# Patient Record
Sex: Male | Born: 1980 | Race: White | Hispanic: No | Marital: Married | Smoking: Former smoker
Health system: Southern US, Community
[De-identification: ages and names within clinical notes are randomized; demographics above are authoritative.]

## PROBLEM LIST (undated history)

## (undated) DIAGNOSIS — F419 Anxiety disorder, unspecified: Secondary | ICD-10-CM

## (undated) DIAGNOSIS — I10 Essential (primary) hypertension: Secondary | ICD-10-CM

## (undated) DIAGNOSIS — R7303 Prediabetes: Secondary | ICD-10-CM

## (undated) DIAGNOSIS — F17200 Nicotine dependence, unspecified, uncomplicated: Secondary | ICD-10-CM

## (undated) HISTORY — DX: Essential (primary) hypertension: I10

## (undated) HISTORY — DX: Anxiety disorder, unspecified: F41.9

## (undated) HISTORY — DX: Nicotine dependence, unspecified, uncomplicated: F17.200

---
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
## 2006-04-06 ENCOUNTER — Ambulatory Visit: Payer: Self-pay | Admitting: Family Medicine

## 2006-09-03 ENCOUNTER — Ambulatory Visit: Payer: Self-pay | Admitting: Family Medicine

## 2008-09-07 ENCOUNTER — Ambulatory Visit: Payer: Self-pay | Admitting: Family Medicine

## 2008-09-13 ENCOUNTER — Emergency Department (HOSPITAL_COMMUNITY): Admission: EM | Admit: 2008-09-13 | Discharge: 2008-09-13 | Payer: Self-pay | Admitting: Emergency Medicine

## 2008-09-17 ENCOUNTER — Ambulatory Visit: Payer: Self-pay | Admitting: Family Medicine

## 2008-09-19 ENCOUNTER — Emergency Department (HOSPITAL_COMMUNITY): Admission: EM | Admit: 2008-09-19 | Discharge: 2008-09-19 | Payer: Self-pay | Admitting: Emergency Medicine

## 2008-09-29 ENCOUNTER — Emergency Department (HOSPITAL_COMMUNITY): Admission: EM | Admit: 2008-09-29 | Discharge: 2008-09-29 | Payer: Self-pay | Admitting: Emergency Medicine

## 2008-10-18 ENCOUNTER — Ambulatory Visit: Payer: Self-pay | Admitting: Family Medicine

## 2008-11-16 ENCOUNTER — Emergency Department (HOSPITAL_COMMUNITY): Admission: EM | Admit: 2008-11-16 | Discharge: 2008-11-16 | Payer: Self-pay | Admitting: Emergency Medicine

## 2009-03-22 ENCOUNTER — Ambulatory Visit: Payer: Self-pay | Admitting: Family Medicine

## 2009-04-04 ENCOUNTER — Emergency Department (HOSPITAL_COMMUNITY): Admission: EM | Admit: 2009-04-04 | Discharge: 2009-04-04 | Payer: Self-pay | Admitting: Emergency Medicine

## 2009-05-22 ENCOUNTER — Ambulatory Visit: Payer: Self-pay | Admitting: Family Medicine

## 2009-05-29 ENCOUNTER — Ambulatory Visit: Payer: Self-pay | Admitting: Family Medicine

## 2009-09-16 ENCOUNTER — Ambulatory Visit: Payer: Self-pay | Admitting: Family Medicine

## 2009-11-04 ENCOUNTER — Ambulatory Visit: Payer: Self-pay | Admitting: Family Medicine

## 2010-05-20 ENCOUNTER — Emergency Department (HOSPITAL_COMMUNITY): Admission: EM | Admit: 2010-05-20 | Discharge: 2010-05-20 | Payer: Self-pay | Admitting: Emergency Medicine

## 2010-05-27 ENCOUNTER — Ambulatory Visit: Payer: Self-pay | Admitting: Family Medicine

## 2010-07-02 ENCOUNTER — Ambulatory Visit: Payer: Self-pay | Admitting: Family Medicine

## 2011-01-05 LAB — BASIC METABOLIC PANEL
Calcium: 9.7 mg/dL (ref 8.4–10.5)
GFR calc Af Amer: >60 mL/min (ref >60)
GFR calc non Af Amer: >60 mL/min (ref >60)
Potassium: 3.6 mEq/L (ref 3.5–5.1)
Sodium: 140 mEq/L (ref 135–145)

## 2011-01-05 LAB — DIFFERENTIAL
Eosinophils Relative: 1 % (ref 0–5)
Lymphocytes Relative: 23 % (ref 12–46)
Lymphs Abs: 2.1 10*3/uL (ref 0.7–4.0)
Monocytes Absolute: 0.7 10*3/uL (ref 0.1–1.0)
Monocytes Relative: 8 % (ref 3–12)
Neutro Abs: 6.3 10*3/uL (ref 1.7–7.7)

## 2011-01-05 LAB — RAPID URINE DRUG SCREEN, HOSP PERFORMED
Amphetamines: NOT DETECTED
Barbiturates: NOT DETECTED
Benzodiazepines: POSITIVE — AB

## 2011-01-05 LAB — URINALYSIS, ROUTINE W REFLEX MICROSCOPIC
Glucose, UA: NEGATIVE mg/dL
Ketones, ur: NEGATIVE mg/dL
Protein, ur: NEGATIVE mg/dL
Urobilinogen, UA: 0.2 mg/dL (ref 0.0–1.0)

## 2011-01-05 LAB — CBC
HCT: 46.4 % (ref 39.0–52.0)
Hemoglobin: 15.9 g/dL (ref 13.0–17.0)
RBC: 4.98 MIL/uL (ref 4.22–5.81)

## 2011-01-23 ENCOUNTER — Encounter: Payer: Self-pay | Admitting: Family Medicine

## 2011-01-23 DIAGNOSIS — G43909 Migraine, unspecified, not intractable, without status migrainosus: Secondary | ICD-10-CM | POA: Insufficient documentation

## 2011-02-06 NOTE — Consult Note (Signed)
[place]  Patient:    Roberto Lewis                      MRN: [id number] Proc. Date: 07/28/99 Adm. Date:  [id number] Attending:  Miguel Aschoff CC:         Miguel Aschoff, M.D.             Marinda Elk, M.D.                          Consultation Report  BRIEF HISTORY:  This 30 year old white male overdosed on both Tylenol and aspirin. He took approximately 125 aspirin pills of 325 mg strength and approximately 10 500 mg Tylenol tablets.  The patient states he was found in a bathroom at the home f his girlfriend by her parents.  The patient is minimally talkative during the interview process although he is fully alert.  He gives vague answers to both questions.  He states that he is just "tired of listening to his friends" and states he has been taking on their burdens as his own.  He denied that he himself has had personal problems but insists that personal problems of his friends is hat he is taking on and this is what led him to his suicide attempt.  He will not elaborate despite numerous questions regarding what may be specifically stressing him out.  He tells me he never abused alcohol or drugs.  He also states that he has no formal psychiatric treatment but he does have a history of taking Zoloft 100 mg a day and he does not know for how long.  He denies any prior attempts but then states that "a while back" he did try to harm himself and then states that he "cant remember how", however, according to chart he did scratch is wrist with a can opener.  He told the admitting physician that he "just didnt want to be around anymore" before this overdose.  There is no evidence he wrote a suicide note.  HISTORY AND DRUG SCREEN:  Is negative for illicit compounds.  Blood alcohol level is negative as well.  MENTAL STATUS EXAMINATION:  The patient is alert and oriented to person, place,  time and situation.  His affect is  flat.  He is mentally talkative with the interview process.  His speech is soft and slow.  His affect does reflect depression.  He has some aches.  No involuntary movements are noted.  No auditory or visual hallucinations are reported.   No paranoia.  He denies that he currently has suicidal thoughts but he is minimally engaged in the interview process that I would not take this at face value at the present time.  FINAL DIAGNOSES:   AXIS I. Major depression status suicide attempt, no psychoses.  AXIS II. Deferred. AXIS III.  Status post overdose on Tylenol and aspirin.  AXIS IV. Reports stressful relationships with friends.   AXIS V. GAF 25, highest this year 80.  RECOMMENDATIONS:  I have not had a chance to phone the parents yet but I will do that also let our ACT team know that this patient is here and I think that when he is medically stable he should be transferred to the adolescent unit. EVen though he is saying he is not suicidal at the present time, he is giving me vague answers to questions and is minimally engaged in the  processes and really has not convinced me that he is completely safe if discharged to just an outpatient level of care.  Will have our ACT team follow up. DD:  07/28/99 TD:  07/28/99 Job: [id number]

## 2011-03-27 ENCOUNTER — Ambulatory Visit (INDEPENDENT_AMBULATORY_CARE_PROVIDER_SITE_OTHER): Payer: 59 | Admitting: Family Medicine

## 2011-03-27 ENCOUNTER — Encounter: Payer: Self-pay | Admitting: Family Medicine

## 2011-03-27 VITALS — BP 130/90 | HR 74 | Wt 207.0 lb

## 2011-03-27 DIAGNOSIS — F17200 Nicotine dependence, unspecified, uncomplicated: Secondary | ICD-10-CM

## 2011-03-27 NOTE — Patient Instructions (Signed)
Call 800 quit now. Use the patches but when they wear off put another one on even if it's not 24 hours. Return here in one month.

## 2011-03-27 NOTE — Progress Notes (Signed)
  Subjective:    Patient ID: Roberto Lewis, male    DOB: [birth year], 30 y.o.   MRN: [id number]  HPI He is here for consult concerning smoking cessation. He has tried patches in the past and has found them to be minimally useful. He has not called any of the 800 number to help with the psychological aspects of this. He hasn't been to do this since his 4-year-old son apparently is now asking him to quit. He also has noted a two-month history of right breast tenderness but no history of injury. He continues on medications listed in the chart.   Review of Systems     Objective:   Physical Exam Alert and in no distress. Exam of the right breast shows no palpable lesions. Abdominal exam shows no masses or tenderness.       Assessment & Plan:  Cigarette abuse. Right breast pain. We will do watchful waiting concern of breast pain and if continued difficulty one month, further evaluation will be warranted. I also discussed smoking cessation with him in regard to [place] versus addiction. Strongly encouraged him to call the 800 number. We'll also have him use the patches regularly and if they do not last a full 24 hours he can switch patches sooner. He'll return here in one month for recheck.

## 2011-04-09 ENCOUNTER — Encounter: Payer: Self-pay | Admitting: Family Medicine

## 2011-04-09 ENCOUNTER — Ambulatory Visit (INDEPENDENT_AMBULATORY_CARE_PROVIDER_SITE_OTHER): Payer: 59 | Admitting: Family Medicine

## 2011-04-09 VITALS — BP 132/80 | HR 80 | Ht 73.5 in | Wt 208.0 lb

## 2011-04-09 DIAGNOSIS — K6289 Other specified diseases of anus and rectum: Secondary | ICD-10-CM

## 2011-04-09 DIAGNOSIS — K625 Hemorrhage of anus and rectum: Secondary | ICD-10-CM

## 2011-04-09 MED ORDER — AMOXICILLIN-POT CLAVULANATE ER 1000-62.5 MG PO TB12: 2.0000 | ORAL_TABLET | Freq: Two times a day (BID) | ORAL | Status: AC

## 2011-04-09 NOTE — Progress Notes (Signed)
Patient presents for evaluation of blood in stool x 2-3 months, off and on.  Noticed a "knot" 7/9 near the rectum, which has gotten much more tender over the last few days.  Very painful now.  Had blood this morning after a bowel movement--on toilet paper; on other occasional has seen red in toilet bowl.  Denies staining in underwear.  Denies abdominal pain.  Denies constipation or diarrhea.  Denies straining for bowel movements. Denies pain with passage of stool.  Does lift heavy items at work  Past Medical History  Diagnosis Date  . Migraine   . Anxiety     (f/b by psych)    History reviewed. No pertinent past surgical history.  History   Social History  . Marital Status: Single    Spouse Name: N/A    Number of Children: 1  . Years of Education: N/A   Occupational History  . landscaper    Social History Main Topics  . Smoking status: Former Smoker -- 1.0 packs/day    Types: Cigarettes    Quit date: 03/29/2011  . Smokeless tobacco: Never Used  . Alcohol Use: No  . Drug Use: No  . Sexually Active: Not on file   Other Topics Concern  . Not on file   Social History Narrative  . No narrative on file    No family history on file.  Current outpatient prescriptions:ARIPiprazole (ABILIFY) 10 MG tablet, Take 10 mg by mouth daily.  , Disp: , Rfl: ;  divalproex (DEPAKOTE) 250 MG 24 hr tablet, Take 250 mg by mouth daily.  , Disp: , Rfl: ;  amoxicillin-clavulanate (AUGMENTIN XR) 1000-62.5 MG per tablet, Take 2 tablets by mouth 2 (two) times daily., Disp: 40 tablet, Rfl: 0  Allergies  Allergen Reactions  . Effexor (Venlafaxine Hydrochloride) Other (See Comments)    Throat swelling, blurred vision   ROS: denies fevers, chills, nausea, vomiting, abdominal pain, constipation diarrhea or other concerns  PHYSICAL EXAM: BP 132/80  Pulse 80  Ht 6' 1.5" (1.867 m)  Wt 208 lb (94.348 kg)  BMI 27.07 kg/m2 Well developed male, in mild-mod discomfort during exam Nicotine patch R upper  buttock External rectal area--a few tags, but no inflamed hemorrhoids.  There is a small area of soft tissue swelling L perirectal area that is very tender to touch.  No overlying erythema, no fluctuance, skin is smooth.  DRE: normal prostate,no masses, heme negative stool.  No significant tenderness on internal rectal exam.  ASSESSMENT/PLAN: 1. Rectal pain    suspect early perirectal abscess.  Sitz baths, antibiotics, re-check next week  2. Rectal bleeding    likely from hemorrhoids.  Due to perirectal discomfort, anoscopy not performed.  Consider anoscopy at f/u visit. No fissure or bleeding external hemorrhoids

## 2011-04-09 NOTE — Patient Instructions (Addendum)
Warm soaks 3 times/day.  Take antibiotics as prescribed, and f/u next week for re-check, sooner if getting worse. Try 800 mg of ibuprofen 3 times daily with food (4 Advils at a time)  [employer] A sitz bath is a warm water bath taken in the sitting position that covers only the hips and buttocks. It may be used for either healing or hygiene purposes. The water may contain medication. Sitz baths are often used to relieve pain, itching or muscle spasms. Moist heat will help you heal and relax. Follow these steps carefully: HOME CARE INSTRUCTIONS  Fill the bathtub half full with warm water. Not Too Hot!   Sit in the water and open the drain a little.   Turn on the warm water to keep the tub half full. Keep the water running constantly.   Soak in the water for 15 to 20 minutes.   After the sitz bath, blot dry the affected part first.   Take 3 to 4 sitz baths a day.  SEEK MEDICAL CARE IF: You get worse instead of better; stop the sitz baths. Document Released: 05/30/2004 Document Re-Released: 09/29/2009 [facility] Patient Information 2011 [place], [place].

## 2011-04-10 ENCOUNTER — Telehealth: Payer: Self-pay | Admitting: Medical

## 2011-04-10 ENCOUNTER — Encounter: Payer: Self-pay | Admitting: Medical

## 2011-04-10 ENCOUNTER — Encounter (INDEPENDENT_AMBULATORY_CARE_PROVIDER_SITE_OTHER): Payer: Self-pay | Admitting: Surgery

## 2011-04-10 ENCOUNTER — Encounter (INDEPENDENT_AMBULATORY_CARE_PROVIDER_SITE_OTHER): Payer: Self-pay | Admitting: General Surgery

## 2011-04-10 ENCOUNTER — Ambulatory Visit (INDEPENDENT_AMBULATORY_CARE_PROVIDER_SITE_OTHER): Payer: 59 | Admitting: Surgery

## 2011-04-10 ENCOUNTER — Ambulatory Visit (INDEPENDENT_AMBULATORY_CARE_PROVIDER_SITE_OTHER): Payer: 59 | Admitting: Medical

## 2011-04-10 VITALS — BP 144/82 | HR 68 | Temp 99.0°F | Ht 74.0 in | Wt 210.1 lb

## 2011-04-10 VITALS — BP 130/80 | HR 98 | Wt 208.0 lb

## 2011-04-10 DIAGNOSIS — K612 Anorectal abscess: Secondary | ICD-10-CM

## 2011-04-10 DIAGNOSIS — K611 Rectal abscess: Secondary | ICD-10-CM

## 2011-04-10 HISTORY — PX: INCISION AND DRAINAGE ABSCESS ANAL: SUR669

## 2011-04-10 MED ORDER — HYDROCODONE-ACETAMINOPHEN 5-500 MG PO TABS: 1.0000 | ORAL_TABLET | Freq: Every day | ORAL | Status: DC

## 2011-04-10 NOTE — Progress Notes (Signed)
Subjective:   HPI Roberto Lewis is a 30 y.o. male who presents for recheck.  Was seen here yesterday by Dr. Lynelle Doctor for rectal pain, recent blood in stool and possible early perirectal abscess.  Overnight and today the pain has worsened, and he feels a swelling and larger area near his rectum.  He can only sit a certain way due to the pain.  Using OTC pain meds without relief.  He has taken 3 doses of Augmentin at this point.  No other aggravating or relieving factors.  No other c/o.  The following portions of the patient's history were reviewed and updated as appropriate: allergies, current medications, past family history, past medical history, past social history, past surgical history and problem list.   Review of Systems Gen: subjective fever, no chills, sweats GI: no nausea, vomiting, diarrhea, abdominal pain     Objective:   Physical Exam  General appearance: alert, no distress, WD/WN, white male Rectal: left perirectal region with 2cm fluctuant  pink/red area with surrounding induration and very tender   Assessment :    Encounter Diagnosis  Name Primary?  . Perirectal abscess Yes     Plan:     I called [facility], and they will work him in to see Dr. Daphine Deutscher.  He received Toradol 60 mg IM now.  Last meal and last BM about 1.5 hours ago.

## 2011-04-10 NOTE — Progress Notes (Signed)
Patient referred from Dr. Joslyn Devon with a left anterior perirectal abscess.  He is in acute pain. No history of chronic diarrhea.  Local infiltration with neut and 6 cc lido with epi.  I & D performed without difficulty.    Followup in 2 weeks

## 2011-04-10 NOTE — Telephone Encounter (Signed)
Per shane pt was called and told to come in this pm

## 2011-04-10 NOTE — Patient Instructions (Signed)
Sit in warm tub bath or shower twice a day Take antibiotic.Marland KitchenMarland KitchenMarland KitchenMarland Kitchenpain med as needed   Peri-Rectal Abscess (Abscess Around the Rectum) Your caregiver has diagnosed you as having a peri-rectal abscess. This is an infected area near the rectum that is filled with pus. If the abscess is near the surface of the skin, your caregiver may open (incise) the area and drain the pus. HOME CARE INSTRUCTIONS  If your abscess was opened up and drained. A small piece of gauze may be placed in the opening so that it can drain. Do not remove the gauze unless directed by your caregiver.   A loose dressing may be placed over the abscess site. Change the dressing as often as necessary to keep it clean and dry.   After the drain is removed, the area may be washed with a gentle antiseptic (soap) four times per day.   A warm sitz bath, warm packs or heating pad may be used for pain relief, taking care not to burn yourself.   Return for a wound check in .   An "inflatable doughnut" may be used for sitting with added comfort. These can be purchased at a drugstore or medical supply house.   To reduce pain and straining with bowel movements, eat a high fiber diet with plenty of fruits and vegetables. Use stool softeners as recommended by your caregiver. This is especially important if narcotic type pain medications were prescribed as these may cause marked constipation.   Only take over-the-counter or prescription medicines for pain, discomfort, or fever as directed by your caregiver.  SEEK IMMEDIATE MEDICAL CARE IF:  You have increasing pain that is not controlled by medication.   There is increased inflammation (redness), swelling, bleeding, or drainage from the area.   An oral temperature above develops.   You develop chills or generalized malaise (feel lethargic or feel "washed out").   You develop any new symptoms (problems) you feel may be related to your present problem.  Document Released: 09/04/2000  Document Re-Released: 01/24/2009 [facility] Patient Information 2011 [place], [place].

## 2011-04-10 NOTE — Telephone Encounter (Signed)
Pt called to come in this pm to see shane

## 2011-04-10 NOTE — Telephone Encounter (Signed)
Diagnosis was rectal pain, possible early abscess.  If in much more pain, I recommend recheck here or the ED today in the event it is abscessing.

## 2011-04-13 ENCOUNTER — Telehealth: Payer: Self-pay | Admitting: Family Medicine

## 2011-04-13 NOTE — Telephone Encounter (Signed)
PT INFORMED-LM 

## 2011-04-22 ENCOUNTER — Encounter: Payer: Self-pay | Admitting: Family Medicine

## 2011-04-24 ENCOUNTER — Ambulatory Visit: Payer: 59 | Admitting: Family Medicine

## 2011-04-29 ENCOUNTER — Encounter: Payer: Self-pay | Admitting: Medical

## 2011-04-29 ENCOUNTER — Ambulatory Visit (INDEPENDENT_AMBULATORY_CARE_PROVIDER_SITE_OTHER): Payer: 59 | Admitting: Medical

## 2011-04-29 VITALS — BP 130/82 | HR 80 | Temp 97.9°F | Resp 16 | Ht 74.0 in | Wt 212.0 lb

## 2011-04-29 DIAGNOSIS — L255 Unspecified contact dermatitis due to plants, except food: Secondary | ICD-10-CM

## 2011-04-29 DIAGNOSIS — L237 Allergic contact dermatitis due to plants, except food: Secondary | ICD-10-CM

## 2011-04-29 MED ORDER — PREDNISONE 10 MG PO TABS: ORAL_TABLET | ORAL | Status: DC

## 2011-04-29 NOTE — Progress Notes (Signed)
Subjective:     Roberto Lewis is a 30 y.o. male who presents for evaluation of possible poison ivy rash.   Works for [employer], was doing some work in the weeds, around poison ivy over the weekend, and rash appeared Sunday.  Rash in on abdomen, back, arms, legs, fingers.  Using Calamine lotion, hydrocortisone cream, with no improvement.   No other new exposures.  Denis associated symptoms.  He notes prior hx/o mild rash with poison ivy exposure.  No other c/o.   The following portions of the patient's history were reviewed and updated as appropriate: allergies, current medications, past family history, past medical history, past social history, past surgical history and problem list.  Review of Systems Constitutional: denies fever, chills, sweats Respiratory: denies cough, shortness of breath Gastroenterology: denies abdominal pain, nausea, vomiting     Objective:    Filed Vitals:   04/29/11 0809  BP: 130/82  Pulse: 80  Temp: 97.9 F (36.6 C)  Resp: 16    General appearance: alert, no distress, WD/WN, male  Skin: diffuse rash with erythema and some whealed lesions on torso, arms, back, legs       Assessment:   Encounter Diagnosis  Name Primary?  . Plant allergic contact dermatitis Yes     Plan:   Avoid re-exposure, avoid hot showers, can use topical hydrocortisone OTC, prednisone taper script given today, Benadryl OTC 2-3 times daily for next few days.  Call/return if not improving or worse.

## 2011-04-29 NOTE — Patient Instructions (Signed)
Poison Ivy (Toxicodendron Dermatitis) Poison ivy is a inflammation of the skin (contact dermatitis) caused by touching the allergens on the leaves of the ivy plant following previous exposure to the plant. The rash usually appears 48 hours after exposure. The rash is usually bumps (papules) or blisters (vesicles) in a linear pattern. Depending on your own sensitivity, the rash may simply cause redness and itching, or it may also progress to blisters which may break open. These must be well cared for to prevent secondary bacterial (germ) infection, followed by scarring. Keep any open areas dry, clean, dressed, and covered with an antibacterial ointment if needed. The eyes may also get puffy. The puffiness is worst in the morning and gets better as the day progresses. This dermatitis usually heals without scarring, within 2 to 3 weeks without treatment. HOME CARE INSTRUCTIONS Thoroughly wash with soap and water as soon as you have been exposed to poison ivy. You have about one half hour to remove the plant resin before it will cause the rash. This washing will destroy the oil or antigen on the skin that is causing, or will cause, the rash. Be sure to wash under your fingernails as any plant resin there will continue to spread the rash. Do not rub skin vigorously when washing affected area. Poison ivy cannot spread if no oil from the plant remains on your body. A rash that has progressed to weeping sores will not spread the rash unless you have not washed thoroughly. It is also important to wash any clothes you have been wearing as these may carry active allergens. The rash will return if you wear the unwashed clothing, even several days later. Avoidance of the plant in the future is the best measure. Poison ivy plant can be recognized by the number of leaves. Generally, poison ivy has three leaves with flowering branches on a single stem. Diphenhydramine may be purchased over the counter and used as needed for  itching. Do not drive with this medication if it makes you drowsy. Ask your caregiver about medication for children. SEEK MEDICAL CARE IF   Open sores develop.   Redness spreads beyond area of rash.   You notice purulent (pus-like) discharge.   You have increased pain.   Other signs of infection develop (such as fever).  Document Released: 09/04/2000 Document Re-Released: 08/26/2009 [employer] Patient Information 2011 [employer]. 

## 2011-04-30 ENCOUNTER — Ambulatory Visit: Payer: 59 | Admitting: Family Medicine

## 2011-05-05 ENCOUNTER — Ambulatory Visit: Payer: 59 | Admitting: Family Medicine

## 2011-06-26 LAB — POCT I-STAT, CHEM 8
BUN: 13 mg/dL (ref 6–23)
Chloride: 101 mEq/L (ref 96–112)
Creatinine, Ser: 1.2 mg/dL (ref 0.4–1.5)
Glucose, Bld: 87 mg/dL (ref 70–99)
HCT: 50 % (ref 39.0–52.0)
Potassium: 4 mEq/L (ref 3.5–5.1)

## 2011-06-26 LAB — D-DIMER, QUANTITATIVE: D-Dimer, Quant: <0.22 ug/mL-FEU (ref 0.00–0.48)

## 2011-07-09 ENCOUNTER — Telehealth: Payer: Self-pay | Admitting: Family Medicine

## 2011-07-09 NOTE — Telephone Encounter (Signed)
Work him into the schedule today or I'll see him tomorrow tell him I will keep his cost low

## 2011-07-09 NOTE — Telephone Encounter (Signed)
PT STATES HE IS HAVING ISSUES WITH ANOTHER ABCESS IN RECTAL AREA. PT ALSO STATES THAT HE HAS LOST HIS JOB AND INSURANCE AND CANT AFFORD TO COME IN FOR AN OFFICE VISIT.  PT REQUESTED THAT MAYBE YOU COULD CALL HIM IN AN RX SINCE IT HASNT BEEN THAT LONG. PLEASE CALL PT HE USES [facility] ON [address].

## 2011-07-10 ENCOUNTER — Encounter: Payer: Self-pay | Admitting: Family Medicine

## 2011-07-10 ENCOUNTER — Ambulatory Visit (INDEPENDENT_AMBULATORY_CARE_PROVIDER_SITE_OTHER): Payer: Self-pay | Admitting: Family Medicine

## 2011-07-10 VITALS — BP 140/88 | HR 80 | Temp 98.3°F | Wt 219.0 lb

## 2011-07-10 DIAGNOSIS — K612 Anorectal abscess: Secondary | ICD-10-CM

## 2011-07-10 DIAGNOSIS — K611 Rectal abscess: Secondary | ICD-10-CM

## 2011-07-10 NOTE — Progress Notes (Signed)
  Subjective:    Patient ID: Roberto Lewis, male    DOB: June 26, 1981, 30 y.o.   MRN: [id number]  HPI He is here for consult concerning difficulty over the last 2 days with rectal discomfort. 2 days ago he started having pain. Yesterday the pain got worse and the lesion opened up and drained on its own causing relief of his pain. He has a previous history of difficulty with a perirectal abscess and surgical intervention.   Review of Systems     Objective:   Physical Exam Alert and in no distress. Exam of the anal area does show a draining lesion at the 3:00 position with minimal induration around this. Rectal exam was nontender.      Assessment & Plan:  Pararectal abscess Recommend irrigation 2 or 3 times per day. No antibiotic needed at this point. Cautioned himr that if this occurred again, surgical intervention and more extensive revision will be needed

## 2011-08-24 ENCOUNTER — Encounter: Payer: Self-pay | Admitting: Medical

## 2011-08-24 ENCOUNTER — Ambulatory Visit (INDEPENDENT_AMBULATORY_CARE_PROVIDER_SITE_OTHER): Payer: Self-pay | Admitting: Medical

## 2011-08-24 VITALS — BP 128/82 | HR 62 | Temp 98.1°F | Resp 16 | Wt 220.0 lb

## 2011-08-24 DIAGNOSIS — R110 Nausea: Secondary | ICD-10-CM | POA: Insufficient documentation

## 2011-08-24 DIAGNOSIS — R1032 Left lower quadrant pain: Secondary | ICD-10-CM | POA: Insufficient documentation

## 2011-08-24 LAB — POCT URINALYSIS DIPSTICK
Bilirubin, UA: NEGATIVE
Blood, UA: NEGATIVE
Glucose, UA: NEGATIVE
Ketones, UA: NEGATIVE
Nitrite, UA: NEGATIVE
Spec Grav, UA: 1.01

## 2011-08-24 MED ORDER — SULFAMETHOXAZOLE-TRIMETHOPRIM 800-160 MG PO TABS: 1.0000 | ORAL_TABLET | Freq: Two times a day (BID) | ORAL | Status: AC

## 2011-08-24 MED ORDER — HYDROCODONE-ACETAMINOPHEN 5-500 MG PO TABS: ORAL_TABLET | ORAL | Status: DC

## 2011-08-24 NOTE — Progress Notes (Signed)
Subjective:   HPI  Roberto Lewis is a 30 y.o. male who presents with pain in left lower abdomen, started out 2-3 weeks ago with nausea as well, but it has progressively gotten worse.  Currently he notes nausea, LLQ pain, low back pain.  He does note some urinary frequency, but no other GU complaints.  Denies diarrhea or vomiting.   Denies fever, chills, sweats, weight loss.   Moving and lying down makes it worse.  Pain is sharp, filled like bladder is full like its ready to explode. Denies hx/o kidney stone, UTI, diverticulosis.  Last urination was hour ago.  Denies blood in urine.  Last BM hour ago.  No other aggravating or relieving factors.    No other c/o.  The following portions of the patient's history were reviewed and updated as appropriate: allergies, current medications, past family history, past medical history, past social history, past surgical history and problem list.  Past Medical History  Diagnosis Date  . Migraine   . Anxiety     (f/b by psych)    Review of Systems Constitutional: -fever, -chills, -sweats, -unexpected -weight change,-fatigue ENT: -runny nose, -ear pain, -sore throat Cardiology:  -chest pain, -palpitations, -edema Respiratory: -cough, -shortness of breath, -wheezing Gastroenterology: +abdominal pain, +nausea, -vomiting, -diarrhea, -constipation Hematology: -bleeding or bruising problems Musculoskeletal: -arthralgias, -myalgias, -joint swelling, +back pain Ophthalmology: -vision changes Urology: -dysuria, -difficulty urinating, -hematuria, -urinary frequency, -urgency Neurology: -headache, -weakness, -tingling, -numbness     Objective:   Physical Exam  Filed Vitals:   08/24/11 1337  BP: 128/82  Pulse: 62  Temp: 98.1 F (36.7 C)  Resp: 16    General appearance: alert, no distress, WD/WN, in pain Skin: unremarkable HEENT: normocephalic, sclerae anicteric, TMs pearly, nares patent, no discharge or erythema, pharynx normal Oral cavity: MMM,  no lesions Neck: supple, no lymphadenopathy, no thyromegaly, no masses Heart: RRR, normal S1, S2, no murmurs Lungs: CTA bilaterally, no wheezes, rhonchi, or rales Abdomen: +bs, soft, tender over LLQ and suprapubic region, otherwise non distended, no masses, no hepatomegaly, no splenomegaly Pulses: 2+ symmetric, upper and lower extremities, normal cap refill GU: normal male external genitalia, no hernia or mass Rectal: anus norma appearing, prostate WNL, no nodules or mass, nontender, unable to palpate entire prostate though   Assessment and Plan :    Encounter Diagnoses  Name Primary?  [name] LLQ abdominal pain Yes  . Nausea    Advised that urinalysis is non contributory today.  Advised of possible etiologies such as prostatitis, diverticulosis, UTI, renal stone, or other less likely such as appendicitis.   Advised that labs and abdominal imaging would be helpful, but he declines for now.   He is married and has no concern for STD.  No STD screening today.  Script for Bactrim and Lortab for pain.  Hydrate well, rest.  Advised if worse pain, fever, inability to urinate, or overall worse to go to the emergency dept.  Otherwise call report 1-2 days.

## 2011-10-12 ENCOUNTER — Ambulatory Visit (INDEPENDENT_AMBULATORY_CARE_PROVIDER_SITE_OTHER): Payer: BC Managed Care – PPO | Admitting: Family Medicine

## 2011-10-12 ENCOUNTER — Encounter: Payer: Self-pay | Admitting: Family Medicine

## 2011-10-12 VITALS — BP 150/100 | HR 100 | Temp 99.1°F | Ht 73.0 in | Wt 218.0 lb

## 2011-10-12 DIAGNOSIS — R509 Fever, unspecified: Secondary | ICD-10-CM

## 2011-10-12 DIAGNOSIS — J069 Acute upper respiratory infection, unspecified: Secondary | ICD-10-CM

## 2011-10-12 LAB — POCT INFLUENZA A/B: Influenza B, POC: NEGATIVE

## 2011-10-12 NOTE — Progress Notes (Signed)
  Subjective:    Patient ID: Roberto Lewis, male    DOB: Jan 11, 1981, 31 y.o.   MRN: [id number]  HPI He has a four-day history of sore throat, nasal congestion, rhinorrhea, dry cough with headache and myalgias. No earache or sinus pressure. He does not smoke. He has not had a flu shot or been exposed to anyone with illness   Review of Systems     Objective:   Physical Exam alert and in no distress. Tympanic membranes and canals are normal. Throat is clear. Tonsils are normal. Neck is supple without adenopathy or thyromegaly. Cardiac exam shows a regular sinus rhythm without murmurs or gallops. Lungs are clear to auscultation. Nasal mucosa shows swollen tissue. Flu test was negative        Assessment & Plan:   1. Fever and chills  POCT Influenza A/B  2. Acute URI     Supportive care including NyQuil and Afrin nasal spray at night. He is to call me if he is continuing to have difficulty after 7-10 days

## 2011-10-12 NOTE — Patient Instructions (Signed)
Use NyQuil at night for coughing and Afrin nasal spray for breathing. Usually her cold goes away in 7-10 days. Tylenol or Advil for the aches and pains

## 2011-12-03 ENCOUNTER — Ambulatory Visit (INDEPENDENT_AMBULATORY_CARE_PROVIDER_SITE_OTHER): Payer: BC Managed Care – PPO | Admitting: Medical

## 2011-12-03 ENCOUNTER — Encounter: Payer: Self-pay | Admitting: Medical

## 2011-12-03 VITALS — BP 140/90 | HR 100 | Wt 222.0 lb

## 2011-12-03 DIAGNOSIS — IMO0002 Reserved for concepts with insufficient information to code with codable children: Secondary | ICD-10-CM

## 2011-12-03 DIAGNOSIS — M722 Plantar fascial fibromatosis: Secondary | ICD-10-CM

## 2011-12-03 DIAGNOSIS — M79609 Pain in unspecified limb: Secondary | ICD-10-CM

## 2011-12-03 DIAGNOSIS — M79673 Pain in unspecified foot: Secondary | ICD-10-CM

## 2011-12-03 DIAGNOSIS — S90519A Abrasion, unspecified ankle, initial encounter: Secondary | ICD-10-CM

## 2011-12-03 MED ORDER — MUPIROCIN 2 % EX OINT: TOPICAL_OINTMENT | Freq: Three times a day (TID) | CUTANEOUS | Status: AC

## 2011-12-03 NOTE — Patient Instructions (Signed)
Breaking in the new boots - wear 1-2 pairs of long boot length work or heavy socks - this will provide padding as well as support.  Try using Skin Lube (sporting lube for runners) on the friction areas of your feet when using the boots.    Blisters - clean with soap and water, use Mupirocin antibiotic ointment on the blisters, cover with nonstick bandage, and wrap with dressing in such a way as to pad the blister to avoid rubbing this further.     Blisters Blisters are fluid-filled sacs that form within the skin. Common causes of blistering are friction, burns, and exposure to irritating chemicals. The fluid in the blister protects the underlying damaged skin. Most of the time it is not recommended that you open blisters. When a blister is opened, there is an increased chance for infection. Usually, a blister will open on its own. They then dry up and peel off within 10 days. If the blister is tense and uncomfortable (painful) the fluid may be drained. If it is drained the roof of the blister should be left intact. The draining should only be done by a medical professional under aseptic conditions. Poorly fitting shoes and boots can cause blisters by being too tight or too loose. Wearing extra socks or using tape, bandages, or pads over the blister-prone area helps prevent the problem by reducing friction. Blisters heal more slowly if you have diabetes or if you have problems with your circulation. You need to be careful about medical follow-up to prevent infection. HOME CARE INSTRUCTIONS  Protect areas where blisters have formed until the skin is healed. Use a special bandage with a hole cut in the middle around the blister. This reduces pressure and friction. When the blister breaks, trim off the loose skin and keep the area clean by washing it with soap daily. Soaking the blister or broken-open blister with diluted vinegar twice daily for 15 minutes will dry it up and speed the healing. Use 3  tablespoons of white vinegar per quart of water (45 mL white vinegar per liter of water). An antibiotic ointment and a bandage can be used to cover the area after soaking.  SEEK MEDICAL CARE IF:   You develop increased redness, pain, swelling, or drainage in the blistered area.   You develop a pus-like discharge from the blistered area, chills, or a fever.  MAKE SURE YOU:   Understand these instructions.   Will watch your condition.   Will get help right away if you are not doing well or get worse.  Document Released: 10/15/2004 Document Revised: 08/27/2011 Document Reviewed: 09/12/2008 [facility] Patient Information 2012 [place], [place].    Plantar Fasciitis Plantar fasciitis is a common condition that causes foot pain. It is soreness (inflammation) of the band of tough fibrous tissue on the bottom of the foot that runs from the heel bone (calcaneus) to the ball of the foot. The cause of this soreness may be from excessive standing, poor fitting shoes, running on hard surfaces, being overweight, having an abnormal walk, or overuse (this is common in runners) of the painful foot or feet. It is also common in aerobic exercise dancers and ballet dancers. SYMPTOMS  Most people with plantar fasciitis complain of:  Severe pain in the morning on the bottom of their foot especially when taking the first steps out of bed. This pain recedes after a few minutes of walking.   Severe pain is experienced also during walking following a long period of  inactivity.   Pain is worse when walking barefoot or up stairs  DIAGNOSIS   Your caregiver will diagnose this condition by examining and feeling your foot.   Special tests such as X-rays of your foot, are usually not needed.  PREVENTION   Consult a sports medicine professional before beginning a new exercise program.   Walking programs offer a good workout. With walking there is a lower chance of overuse injuries common to runners. There is less  impact and less jarring of the joints.   Begin all new exercise programs slowly. If problems or pain develop, decrease the amount of time or distance until you are at a comfortable level.   Wear good shoes and replace them regularly.   Stretch your foot and the heel cords at the back of the ankle (Achilles tendon) both before and after exercise.   Run or exercise on even surfaces that are not hard. For example, asphalt is better than pavement.   Do not run barefoot on hard surfaces.   If using a treadmill, vary the incline.   Do not continue to workout if you have foot or joint problems. Seek professional help if they do not improve.  HOME CARE INSTRUCTIONS   Avoid activities that cause you pain until you recover.   Use ice or cold packs on the problem or painful areas after working out.   Only take over-the-counter or prescription medicines for pain, discomfort, or fever as directed by your caregiver.   Soft shoe inserts or athletic shoes with air or gel sole cushions may be helpful.   If problems continue or become more severe, consult a sports medicine caregiver or your own health care provider. Cortisone is a potent anti-inflammatory medication that may be injected into the painful area. You can discuss this treatment with your caregiver.  MAKE SURE YOU:   Understand these instructions.   Will watch your condition.   Will get help right away if you are not doing well or get worse.  Document Released: 06/02/2001 Document Revised: 08/27/2011 Document Reviewed: 08/01/2008 [facility] Patient Information 2012 [place], [place].

## 2011-12-03 NOTE — Progress Notes (Signed)
Subjective:   HPI  Roberto Lewis is a 31 y.o. male who presents for leg issue.  Last Friday 6 days ago he got a new job, got new boots he is wearing on the job.  Since then having blisters on his legs and pain in legs from the boots.  Having pain in feet and ankles, and soreness at ankles.   Using epsom salt baths, peroxide to clean the blisters, using some ointment on the blisters, and wrapping in bandages.  Works Therapist, occupational, in a [employer].  Using steel toe leather boots.  Even before the blisters and new job, he notes ongoing aches in the bottoms of his feet for months.  Hurts as soon as he gets out of bed every morning.  Denies injury or trauma.  No other aggravating or relieving factors.    No other c/o.  The following portions of the patient's history were reviewed and updated as appropriate: allergies, current medications, past family history, past medical history, past social history, past surgical history and problem list.  Past Medical History  Diagnosis Date  . Migraine   . Anxiety     (f/b by psych)    Allergies  Allergen Reactions  . Effexor (Venlafaxine Hydrochloride) Other (See Comments)    Throat swelling, blurred vision     Review of Systems ROS reviewed and was negative other than noted in HPI or above.    Objective:   Physical Exam  General appearance: alert, no distress, WD/WN Skin:left lateral ankle with 3cm x 1 cm abrasion, right posterior heel with similar 2cm x 2cm abrasion, no drainage, warmth, or fluctuance.   MSK: tender bilat feet along plantar fasciitis, mild generalized foot tenderness, no ankle tenderness, rest of LE exam unremarkable Neuro: normal strength and sensation of LE Pulses: 2+ pedal pulses  Assessment and Plan :     Encounter Diagnoses  Name Primary?  . Abrasion of ankle Yes  . Foot pain   . Plantar fasciitis, bilateral    Abrasion - script for mupirocin, discussed bandage and dressing, keeping this banged to avoid  re-abraded.   Call if not improving.   Foot pain - discussed using skin lube and thick supportive socks to help avoid abrasions until the leather gets worn in.  Plantar fascitis - discussed stretching, tennis ball massage, daily routine to help the pain.  Offered PT, but he wants to try the home stretching for now.   Call/return if not improving in a few weeks.

## 2012-01-20 HISTORY — PX: INCISION AND DRAINAGE ABSCESS ANAL: SUR669

## 2012-02-16 ENCOUNTER — Telehealth: Payer: Self-pay | Admitting: Family Medicine

## 2012-02-16 NOTE — Telephone Encounter (Signed)
Pt called and informed of appt.

## 2012-02-16 NOTE — Telephone Encounter (Signed)
Usually an antibiotic doesn't do him much good. He will need to be set up to see the surgeons. Get him in tomorrow

## 2012-02-16 NOTE — Telephone Encounter (Signed)
Pt called and stated that the abcess has returned in the exact same place as before. Pt wants to know if you can call something in. Pt uses [facility] [address]. Please call pt.

## 2012-02-16 NOTE — Telephone Encounter (Signed)
Called pt to inform him of appt

## 2012-02-16 NOTE — Telephone Encounter (Signed)
Called [facility] they can see him Thursday 2;30

## 2012-02-18 ENCOUNTER — Encounter (INDEPENDENT_AMBULATORY_CARE_PROVIDER_SITE_OTHER): Payer: Self-pay | Admitting: Surgery

## 2012-02-18 ENCOUNTER — Telehealth (INDEPENDENT_AMBULATORY_CARE_PROVIDER_SITE_OTHER): Payer: Self-pay | Admitting: General Surgery

## 2012-02-18 ENCOUNTER — Ambulatory Visit (INDEPENDENT_AMBULATORY_CARE_PROVIDER_SITE_OTHER): Payer: BC Managed Care – PPO | Admitting: Surgery

## 2012-02-18 VITALS — BP 160/90 | HR 120 | Temp 98.0°F | Resp 18 | Ht 74.0 in | Wt 220.0 lb

## 2012-02-18 DIAGNOSIS — K610 Anal abscess: Secondary | ICD-10-CM

## 2012-02-18 DIAGNOSIS — K612 Anorectal abscess: Secondary | ICD-10-CM

## 2012-02-18 NOTE — Progress Notes (Signed)
Subjective:     Patient ID: Roberto Lewis., male   DOB: [birth year], 31 y.o.   MRN: [id number]  HPI  This is a gentleman with a prior history of perianal abscesses who is referred by Dr. Susann Givens for evaluation of recurrent abscess. He reports that it started several days ago. It has had no drainage. Review of Systems     Objective:   Physical Exam On exam, there is a small anterior abscess this is at the site of her previous abscess. I anesthetized with lidocaine after prepping with Betadine. I then drained an abscess cavity with a scalpel. This was mild to moderate in size. I then packed with gauze    Assessment:     Perianal abscess    Plan:     I would place him on antibiotics and hydrocodone. Wound care instructions were given. As this is multiple recurrences, I will see him back in a month to discuss whether or not he should have definitive surgery

## 2012-02-18 NOTE — Telephone Encounter (Signed)
Pt calling about removing the gauze from the abscess site.  States it is too painful when he tried to pull it out.  Suggested moistening it to help shrink it to get it out, then cover with DSD when not using the toilet.  He understands.

## 2012-03-22 ENCOUNTER — Encounter (INDEPENDENT_AMBULATORY_CARE_PROVIDER_SITE_OTHER): Payer: BC Managed Care – PPO | Admitting: Surgery

## 2012-06-17 ENCOUNTER — Ambulatory Visit (INDEPENDENT_AMBULATORY_CARE_PROVIDER_SITE_OTHER): Payer: BC Managed Care – PPO | Admitting: Medical

## 2012-06-17 ENCOUNTER — Encounter: Payer: Self-pay | Admitting: Medical

## 2012-06-17 VITALS — BP 132/80 | HR 100 | Temp 98.4°F | Resp 18 | Wt 220.0 lb

## 2012-06-17 DIAGNOSIS — B353 Tinea pedis: Secondary | ICD-10-CM

## 2012-06-17 DIAGNOSIS — B356 Tinea cruris: Secondary | ICD-10-CM

## 2012-06-17 DIAGNOSIS — B351 Tinea unguium: Secondary | ICD-10-CM

## 2012-06-17 MED ORDER — TERBINAFINE HCL 250 MG PO TABS: 250.0000 mg | ORAL_TABLET | Freq: Every day | ORAL | Status: DC

## 2012-06-17 MED ORDER — ECONAZOLE NITRATE 1 % EX CREA: TOPICAL_CREAM | Freq: Every day | CUTANEOUS | Status: DC

## 2012-06-17 NOTE — Patient Instructions (Signed)
Begin Lamisil tablet, once daily for nail fungus and skin fungus.   It takes sometimes weeks to months for new healing nail to grow out.  Thus, don't expect changes in the nail appearance right away.  Begin Econazole antifungal cream once daily to feet and in between thighs.  Apply this at night after showering or at bedtime.  Try wearing boxers instead of briefs in general.  Consider drying powders such as A&D powder in the groin and feet area to keep both dry. Wear shoes that breath.   Try letting feet air out in the evening.    Don't go barefooted in wet damp areas.

## 2012-06-17 NOTE — Progress Notes (Signed)
Subjective: He reports hx/o athletes foot and jock itch.  Recently been having recurrence of rash on inner upper thighs, between toes, on bottom of foot with cracking.  Rash is itchy, has odor on the foot rash.  Has used topical cream in past.  He also notes right thumb and last 2 right fingers with nail changes making him concerned about nail fungus.  ROS as noted above.  Objective: Gen: wd,wn, nad Skin: well circumscribed area of salmon to erythematous color rash in intertriginous region bilat, similar rash on both feet, worse on between toes, some craving, some flaking, all suggestive of tinea.  Right thumb and 4th and 5th nails with some thickening, yellowing, and suggestive of fungus  Assessment: Encounter Diagnoses  Name Primary?  . Tinea pedis Yes  . Tinea cruris   . Onychomycosis    Plan: begin Lamisil, topical econazole, switch to boxer underwear, let feet air out at QHS.  Keep groin and feet dry, consider A&D powder to help with moisture.  Change socks daily.  Advised that it could take weeks for nail fungus to show sins of improvement.  Recheck here for labs and physical in 6-10 weeks.

## 2012-08-09 ENCOUNTER — Encounter: Payer: Self-pay | Admitting: Family Medicine

## 2012-08-09 ENCOUNTER — Ambulatory Visit (INDEPENDENT_AMBULATORY_CARE_PROVIDER_SITE_OTHER): Payer: BC Managed Care – PPO | Admitting: Family Medicine

## 2012-08-09 VITALS — BP 160/100 | HR 104 | Wt 225.0 lb

## 2012-08-09 DIAGNOSIS — M25561 Pain in right knee: Secondary | ICD-10-CM

## 2012-08-09 DIAGNOSIS — M25531 Pain in right wrist: Secondary | ICD-10-CM

## 2012-08-09 DIAGNOSIS — R51 Headache: Secondary | ICD-10-CM

## 2012-08-09 DIAGNOSIS — I10 Essential (primary) hypertension: Secondary | ICD-10-CM | POA: Insufficient documentation

## 2012-08-09 DIAGNOSIS — M25539 Pain in unspecified wrist: Secondary | ICD-10-CM

## 2012-08-09 DIAGNOSIS — M25569 Pain in unspecified knee: Secondary | ICD-10-CM

## 2012-08-09 MED ORDER — HYDROCHLOROTHIAZIDE 12.5 MG PO CAPS: 12.5000 mg | ORAL_CAPSULE | Freq: Every day | ORAL | Status: DC

## 2012-08-09 NOTE — Progress Notes (Signed)
  Subjective:    Patient ID: Roberto Bow., male    DOB: 06/13/1981, 31 y.o.   MRN: [id number]  HPI He is here for evaluation of right wrist pain as well as bilateral knee pain. He also has concerns about his blood pressure. He is also had difficulty with headaches. He complains of right wrist pain for the last year. It has interfered with his ability to work. He has to use a different way does hammer to get his job done. He also notes difficulty opening and closing heavy doors. He also complains of bilateral knee pain. 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 He also complains of intermittent headache in the frontal area. No nausea, vomiting, blurred vision or double vision. He cannot relate the headaches to any particular. Presently is not having one. He gets relief with OTC NSAID's.   Review of Systems     Objective:   Physical Exam Alert and in no distress. Exam of the right wrist does show tenderness to palpation over the mid dorsal carpal bone area. Good motion of the wrist. Normal strength. Exam of both knees shows tenderness to palpation over the entire patellar tendon. No effusion was noted. No laxity noted. A pressure is recorded. Previous blood pressures were reviewed.      Assessment & Plan:   1. Right wrist pain  Ambulatory referral to Orthopedic Surgery  2. Bilateral knee pain    3. Hypertension  hydrochlorothiazide (MICROZIDE) 12.5 MG capsule  4. Headache     since he is having difficulty with wrist pain interfering with work, I will refer him surgery. Recommend appropriate padding for his knees and to spend as little time as possible on his knees. He will continue to treat his headache with OTC medications. I will start him on HCTZ and have him return here in one month for recheck

## 2012-08-17 ENCOUNTER — Encounter: Payer: Self-pay | Admitting: Internal Medicine

## 2012-09-02 ENCOUNTER — Ambulatory Visit (INDEPENDENT_AMBULATORY_CARE_PROVIDER_SITE_OTHER): Payer: BC Managed Care – PPO | Admitting: Family Medicine

## 2012-09-02 ENCOUNTER — Encounter: Payer: Self-pay | Admitting: Family Medicine

## 2012-09-02 VITALS — BP 130/90 | HR 79 | Wt 222.0 lb

## 2012-09-02 DIAGNOSIS — I10 Essential (primary) hypertension: Secondary | ICD-10-CM

## 2012-09-02 NOTE — Progress Notes (Signed)
  Subjective:    Patient ID: Roberto Bow., male    DOB: 06-Oct-1980, 31 y.o.   MRN: [id number]  HPI He is here for a recheck. He has had no difficulty with his blood pressure medication. He notes that he has gained weight since she's been on this new medication for his underlying mood disorder. He apparently has been told that he has bipolar disorder as well as anxiety depression.   Review of Systems     Objective:   Physical Exam Alert and in no distress otherwise not examined       Assessment & Plan:   1. Hypertension    continue present medication regimen. Also encouraged him to have his psychiatrist send me a note concerning the therapy and underlying diagnosis.

## 2012-09-13 ENCOUNTER — Telehealth (INDEPENDENT_AMBULATORY_CARE_PROVIDER_SITE_OTHER): Payer: Self-pay

## 2012-09-13 NOTE — Telephone Encounter (Signed)
Pt called to get appt this week with Dr Magnus Ivan. Pt states he thinks his rectal abscess may be coming back. Pt offered next appt date with Dr Magnus Ivan. Pt decline appt and wants to wait until Thursday to see if area improves with warm soaks.

## 2012-09-15 ENCOUNTER — Ambulatory Visit (INDEPENDENT_AMBULATORY_CARE_PROVIDER_SITE_OTHER): Payer: BC Managed Care – PPO | Admitting: General Surgery

## 2012-09-15 ENCOUNTER — Encounter (INDEPENDENT_AMBULATORY_CARE_PROVIDER_SITE_OTHER): Payer: Self-pay | Admitting: General Surgery

## 2012-09-15 VITALS — BP 138/90 | HR 105 | Temp 98.6°F | Resp 18 | Ht 74.0 in | Wt <= 1120 oz

## 2012-09-15 DIAGNOSIS — K612 Anorectal abscess: Secondary | ICD-10-CM

## 2012-09-15 DIAGNOSIS — K610 Anal abscess: Secondary | ICD-10-CM

## 2012-09-15 HISTORY — PX: INCISION AND DRAINAGE ABSCESS ANAL: SUR669

## 2012-09-15 MED ORDER — OXYCODONE-ACETAMINOPHEN 7.5-325 MG PO TABS: 1.0000 | ORAL_TABLET | ORAL | Status: DC | PRN

## 2012-09-15 MED ORDER — DOXYCYCLINE HYCLATE 100 MG PO TABS: 100.0000 mg | ORAL_TABLET | Freq: Two times a day (BID) | ORAL | Status: DC

## 2012-09-15 NOTE — Progress Notes (Signed)
Subjective:     Patient ID: Roberto Lewis., male   DOB: [birth year], 31 y.o.   MRN: [id number]  HPI This patient presents today for evaluation of pain and swelling and tenderness in theperianal region. For the last 5 days, he has had increasing pain and swelling and cold sweats. He says he has had 2 prior episodes of similar infection in the area each time requiring incision and drainage. He says that after the acute infection resolves he does have a residual knot in the area.    Review of Systems     Objective:   Physical Exam He has along the left anterior portion of his perianal region a tender and fluctuant area of consistent with an infected perianal abscess. Discussed with him the risks of the procedure and informed consent was obtained.  The area was prepped with chlorhexidine solution and anesthetized with 1% lidocaine with epinephrine. Made an incision over the fluctuant area and drained a large amount of purulent material and the wound was irrigated and packed and dressing applied. There were no apparent complications    Assessment:     Perianal abscess He does have recurrent perianal abscess which was treated with incision and drainage today. We'll also prescribe some antibiotics and he will do packing in followup in a couple weeks for repeat evaluation    Plan:     Wound care as explained.  Doxycycline 100mg  po bid percocet

## 2012-09-22 ENCOUNTER — Ambulatory Visit (INDEPENDENT_AMBULATORY_CARE_PROVIDER_SITE_OTHER): Payer: BC Managed Care – PPO | Admitting: Family Medicine

## 2012-09-22 VITALS — BP 140/90 | HR 103 | Temp 98.2°F | Wt 220.0 lb

## 2012-09-22 DIAGNOSIS — I10 Essential (primary) hypertension: Secondary | ICD-10-CM

## 2012-09-22 DIAGNOSIS — H6692 Otitis media, unspecified, left ear: Secondary | ICD-10-CM

## 2012-09-22 DIAGNOSIS — H6690 Otitis media, unspecified, unspecified ear: Secondary | ICD-10-CM

## 2012-09-22 DIAGNOSIS — H6120 Impacted cerumen, unspecified ear: Secondary | ICD-10-CM

## 2012-09-22 DIAGNOSIS — H918X9 Other specified hearing loss, unspecified ear: Secondary | ICD-10-CM

## 2012-09-22 MED ORDER — AMOXICILLIN 875 MG PO TABS: 875.0000 mg | ORAL_TABLET | Freq: Two times a day (BID) | ORAL | Status: DC

## 2012-09-22 NOTE — Progress Notes (Signed)
  Subjective:    Patient ID: Roberto Lewis, male    DOB: 1981/04/08, 32 y.o.   MRN: [id number]  HPI He has a two-day history of left ear pain and popping sensation. No sore throat, fever or chills. He is finishing off taking doxycycline for treatment of a perianal abscess.   Review of Systems     Objective:   Physical Exam alert and in no distress. Tympanic membrane on the left is slightly erythematous and dull, right is normal canals are normal after cerumen was lavaged from both canals. Throat is clear. Tonsils are normal. Neck is supple without adenopathy or thyromegaly. Cardiac exam shows a regular sinus rhythm without murmurs or gallops. Lungs are clear to auscultation.        Assessment & Plan:   1. LOM (left otitis media)  amoxicillin (AMOXIL) 875 MG tablet  2. Hearing loss secondary to cerumen impaction    3. Hypertension     he is to return here in one month for recheck on his blood pressure.

## 2012-09-23 ENCOUNTER — Encounter (INDEPENDENT_AMBULATORY_CARE_PROVIDER_SITE_OTHER): Payer: BC Managed Care – PPO | Admitting: Surgery

## 2012-09-26 ENCOUNTER — Ambulatory Visit (INDEPENDENT_AMBULATORY_CARE_PROVIDER_SITE_OTHER): Payer: BC Managed Care – PPO | Admitting: Surgery

## 2012-09-26 ENCOUNTER — Encounter (INDEPENDENT_AMBULATORY_CARE_PROVIDER_SITE_OTHER): Payer: Self-pay | Admitting: Surgery

## 2012-09-26 VITALS — BP 148/86 | HR 92 | Temp 97.0°F | Resp 16 | Ht 74.0 in | Wt 224.2 lb

## 2012-09-26 DIAGNOSIS — Z09 Encounter for follow-up examination after completed treatment for conditions other than malignant neoplasm: Secondary | ICD-10-CM

## 2012-09-26 DIAGNOSIS — K612 Anorectal abscess: Secondary | ICD-10-CM | POA: Insufficient documentation

## 2012-09-26 NOTE — Progress Notes (Signed)
Subjective:     Patient ID: Roberto Lewis, male   DOB: [birth year], 32 y.o.   MRN: [id number]  HPI He is here for a postop visit status post incision and drainage of a perianal abscess. This is the third such procedure in a year and a half. He has never noticed stool leaking from the area. He is doing well since the incision and drainage procedure  Review of Systems     Objective:   Physical Exam There is a small open area with no drainage and minimal discomfort    Assessment:     Recurrent perianal abscess    Plan:     I do suspect there may be a fistula. I recommend exam under anesthesia. He will discuss this with his wife and get back to me. If he decides to hold on surgery, I will see him back in one month

## 2012-10-27 ENCOUNTER — Emergency Department (HOSPITAL_COMMUNITY)
Admission: EM | Admit: 2012-10-27 | Discharge: 2012-10-27 | Disposition: A | Discharge status: Home / Self Care | Payer: BC Managed Care – PPO | Attending: Emergency Medicine | Admitting: Emergency Medicine

## 2012-10-27 ENCOUNTER — Encounter (HOSPITAL_COMMUNITY): Payer: Self-pay | Admitting: *Deleted

## 2012-10-27 DIAGNOSIS — Z79899 Other long term (current) drug therapy: Secondary | ICD-10-CM | POA: Insufficient documentation

## 2012-10-27 DIAGNOSIS — K089 Disorder of teeth and supporting structures, unspecified: Secondary | ICD-10-CM | POA: Insufficient documentation

## 2012-10-27 DIAGNOSIS — K0889 Other specified disorders of teeth and supporting structures: Secondary | ICD-10-CM

## 2012-10-27 DIAGNOSIS — Z87891 Personal history of nicotine dependence: Secondary | ICD-10-CM | POA: Insufficient documentation

## 2012-10-27 DIAGNOSIS — R51 Headache: Secondary | ICD-10-CM | POA: Insufficient documentation

## 2012-10-27 MED ORDER — PERCOCET 5-325 MG PO TABS: 1.0000 | ORAL_TABLET | Freq: Four times a day (QID) | ORAL | Status: DC | PRN

## 2012-10-27 MED ORDER — PENICILLIN V POTASSIUM 500 MG PO TABS: 500.0000 mg | ORAL_TABLET | Freq: Four times a day (QID) | ORAL | Status: DC

## 2012-10-27 MED ORDER — BUPIVACAINE-EPINEPHRINE PF 0.5-1:200000 % IJ SOLN
1.8000 mL | Freq: Once | INTRAMUSCULAR | Status: AC
  Administered 2012-10-27: 9 mg
  Filled 2012-10-27: qty 1.8

## 2012-10-27 NOTE — ED Provider Notes (Signed)
Medical screening examination/treatment/procedure(s) were performed by non-physician practitioner and as supervising physician I was immediately available for consultation/collaboration.  Sherrel Ploch, MD 10/27/12 0655 

## 2012-10-27 NOTE — ED Provider Notes (Signed)
History     CSN: [id number]  Arrival date & time 10/27/12  7829   None     No chief complaint on file.   (Consider location/radiation/quality/duration/timing/severity/associated sxs/prior treatment) HPI Comments: Patient presents to the emergency department with a dental complaint. Symptoms began tonight and awoke pt from sleep. The patient has tried to alleviate pain with nothing and came straight to ER.  Pain rated at a 10/10, characterized as throbbing in nature and located left lower mouth. Patient denies fever, night sweats, chills, difficulty swallowing or opening mouth, SOB, nuchal rigidity or decreased ROM of neck.  Patient does not have a dentist and requests a resource guide at discharge. Note pt reports similar pain 1 year ago which required a root canal.    The history is provided by the patient.    Past Medical History  Diagnosis Date  . Migraine   . Anxiety     (f/b by psych)  . Smoker     Past Surgical History  Procedure Date  . Incision and drainage abscess anal     No family history on file.  History  Substance Use Topics  . Smoking status: Former Smoker -- 1.0 packs/day    Types: Cigarettes    Quit date: 03/29/2011  . Smokeless tobacco: Never Used  . Alcohol Use: No      Review of Systems  All other systems reviewed and are negative.    Allergies  Effexor  Home Medications   Current Outpatient Rx  Name  Route  Sig  Dispense  Refill  . ARIPIPRAZOLE 10 MG PO TABS   Oral   Take 10 mg by mouth daily.           [name] DIVALPROEX SODIUM ER 250 MG PO TB24   Oral   Take 250 mg by mouth daily.           [name] HYDROCHLOROTHIAZIDE 12.5 MG PO CAPS   Oral   Take 1 capsule (12.5 mg total) by mouth daily.   30 capsule   5     BP 147/99  Pulse 77  Temp 98 F (36.7 C)  Resp 20  SpO2 99%  Physical Exam  Nursing note and vitals reviewed. Constitutional: He is oriented to person, place, and time. He appears well-developed and well-nourished.  No distress.  HENT:  Head: Normocephalic and atraumatic. No trismus in the jaw.  Mouth/Throat: Uvula is midline, oropharynx is clear and moist and mucous membranes are normal. Abnormal dentition. No dental abscesses or uvula swelling. No oropharyngeal exudate, posterior oropharyngeal edema, posterior oropharyngeal erythema or tonsillar abscesses.        Pt able to open and close mouth with out difficulty. Airway intact. Uvula midline. Mild gingival swelling with tenderness over affected area (left lower molar), but no fluctuance. No swelling or tenderness of submental and submandibular regions.  Eyes: Conjunctivae normal and EOM are normal.  Neck: Normal range of motion and full passive range of motion without pain. Neck supple.  Cardiovascular: Normal rate and regular rhythm.   Pulmonary/Chest: Effort normal and breath sounds normal. No stridor. No respiratory distress. He has no wheezes.  Musculoskeletal: Normal range of motion.  Lymphadenopathy:       Head (right side): No submental, no submandibular, no tonsillar, no preauricular and no posterior auricular adenopathy present.       Head (left side): No submental, no submandibular, no tonsillar, no preauricular and no posterior auricular adenopathy present.    He has no cervical  adenopathy.  Neurological: He is alert and oriented to person, place, and time.  Skin: Skin is warm and dry. No rash noted. He is not diaphoretic.    ED Course  Dental Date/Time: 10/27/2012 4:32 AM Performed by: Jaci Carrel Authorized by: Jaci Carrel Consent: Verbal consent obtained. Risks and benefits: risks, benefits and alternatives were discussed Consent given by: patient Patient understanding: patient states understanding of the procedure being performed Patient consent: the patient's understanding of the procedure matches consent given Patient identity confirmed: verbally with patient and arm band Local anesthesia used: yes Anesthesia: nerve  block Local anesthetic: bupivacaine 0.25% with epinephrine Anesthetic total: 1.8 ml Patient sedated: no Patient tolerance: Patient tolerated the procedure well with no immediate complications. Comments: Nerve block for pain relief, tolerated well   (including critical care time)  Labs Reviewed - No data to display No results found.   No diagnosis found.    MDM  Dental pain  Patient with toothache.  No gross abscess.  Exam unconcerning for Ludwig's angina or spread of infection.  Will treat with penicillin and pain medicine.  Urged patient to follow-up with dentist.           Jaci Carrel, PA-C 10/27/12 [phone]

## 2012-10-27 NOTE — ED Notes (Signed)
Pt states woke up hour ago c/o pain left side of face; toothache; root canal done 1 yr ago left side; swelling noted left side of face

## 2012-10-28 ENCOUNTER — Ambulatory Visit (INDEPENDENT_AMBULATORY_CARE_PROVIDER_SITE_OTHER): Payer: BC Managed Care – PPO | Admitting: Family Medicine

## 2012-10-28 ENCOUNTER — Encounter: Payer: Self-pay | Admitting: Family Medicine

## 2012-10-28 VITALS — BP 142/100 | HR 80 | Wt 231.0 lb

## 2012-10-28 DIAGNOSIS — Z79899 Other long term (current) drug therapy: Secondary | ICD-10-CM

## 2012-10-28 DIAGNOSIS — I10 Essential (primary) hypertension: Secondary | ICD-10-CM

## 2012-10-28 MED ORDER — LISINOPRIL-HYDROCHLOROTHIAZIDE 10-12.5 MG PO TABS: 1.0000 | ORAL_TABLET | Freq: Every day | ORAL | Status: DC

## 2012-10-28 NOTE — Progress Notes (Signed)
  Subjective:    Patient ID: Roberto Lewis, male    DOB: 1980/12/02, 32 y.o.   MRN: [id number]  HPI He is here for recheck on his blood pressure. He has had no difficulty from the medication. He was seen in the emergency room yesterday for toothache.   Review of Systems     Objective:   Physical Exam Alert and in no distress. Blood pressure is recorded.       Assessment & Plan:  I will stop the HCTZ and switch him to lisinopril. Discussed possible side effects from the lisinopril in regard to cough and swelling. He will let me know if he has any troubles with that otherwise I will see him in one month. 1. Hypertension  lisinopril-hydrochlorothiazide (PRINZIDE,ZESTORETIC) 10-12.5 MG per tablet  2. Encounter for long-term (current) use of other medications

## 2012-11-04 ENCOUNTER — Encounter (INDEPENDENT_AMBULATORY_CARE_PROVIDER_SITE_OTHER): Payer: BC Managed Care – PPO | Admitting: Surgery

## 2012-11-24 ENCOUNTER — Ambulatory Visit: Payer: BC Managed Care – PPO | Admitting: Family Medicine

## 2012-12-02 ENCOUNTER — Ambulatory Visit: Payer: BC Managed Care – PPO | Admitting: Family Medicine

## 2012-12-13 ENCOUNTER — Encounter: Payer: Self-pay | Admitting: Family Medicine

## 2012-12-13 ENCOUNTER — Ambulatory Visit (INDEPENDENT_AMBULATORY_CARE_PROVIDER_SITE_OTHER): Payer: BC Managed Care – PPO | Admitting: Family Medicine

## 2012-12-13 VITALS — BP 120/82 | HR 90 | Wt 228.0 lb

## 2012-12-13 DIAGNOSIS — I10 Essential (primary) hypertension: Secondary | ICD-10-CM

## 2012-12-13 MED ORDER — LISINOPRIL-HYDROCHLOROTHIAZIDE 10-12.5 MG PO TABS
1.0000 | ORAL_TABLET | Freq: Every day | ORAL | Status: DC
Start: 2012-12-13 — End: 2014-02-25

## 2012-12-13 NOTE — Progress Notes (Signed)
  Subjective:    Patient ID: Roberto Lewis, male    DOB: 03-29-1981, 32 y.o.   MRN: [id number]  HPI He is here for recheck. He does state that he has had a slight increase in his cough since starting on this medication but is not overly concerned about it. He states that it is working he would like to continue.   Review of Systems     Objective:   Physical Exam Alert and in no distress. Blood pressure is recorded.       Assessment & Plan:  Hypertension - Plan: lisinopril-hydrochlorothiazide (PRINZIDE,ZESTORETIC) 10-12.5 MG per tablet I discussed worsening of his cough especially if it starts to bother him or anyone that he is around. Also discussed the possibility of this getting worse if he gets a URI etc.

## 2012-12-13 NOTE — Patient Instructions (Addendum)
If you're coughing gets worse or other people complain,let me know and I will switch. Sometimes after you get a cold the coughing can linger and that can also be an issue so keep that in mind.

## 2013-03-13 ENCOUNTER — Encounter: Payer: Self-pay | Admitting: Family Medicine

## 2013-03-13 ENCOUNTER — Ambulatory Visit (INDEPENDENT_AMBULATORY_CARE_PROVIDER_SITE_OTHER): Payer: BC Managed Care – PPO | Admitting: Family Medicine

## 2013-03-13 VITALS — BP 120/84 | HR 111 | Temp 98.7°F | Wt 222.0 lb

## 2013-03-13 DIAGNOSIS — K612 Anorectal abscess: Secondary | ICD-10-CM

## 2013-03-13 DIAGNOSIS — K610 Anal abscess: Secondary | ICD-10-CM

## 2013-03-13 MED ORDER — DOXYCYCLINE HYCLATE 100 MG PO TABS: 100.0000 mg | ORAL_TABLET | Freq: Two times a day (BID) | ORAL | Status: DC

## 2013-03-13 NOTE — Patient Instructions (Signed)
Take 4 Advil 3 times a day for the pain. Start taking the antibiotic today. If you get worse call me and I will get you can to see the surgeons.

## 2013-03-13 NOTE — Progress Notes (Signed)
  Subjective:    Patient ID: Roberto Lewis, male    DOB: 27-Jun-1981, 32 y.o.   MRN: [id number]  HPI He is here for consult concerning continued difficulty with perianal discomfort. Review his record indicates he has had at least 3 of these episodes within the last several years. He has been seen by surgery and they recommended evaluation under anesthesia. He did not followup on this due to financial constraints and potentially taking time out work and required surgical intervention. His most recent episode started approximately 2 weeks ago with slight discomfort however the last couple of days the pain and swelling have increased.   Review of Systems     Objective:   Physical Exam Anal exam shows a 2 cm round firm slightly tender lesion at roughly the 2:00 position.       Assessment & Plan:  Perianal abscess - Plan: doxycycline (VIBRA-TABS) 100 MG tablet  I will place him back on doxycycline. Discussed appropriate followup on this. Hopefully this will quiet down. Encouraged him to go ahead and get the exam under anesthesia to look for a fistula. I explained the dangers of potentially getting reinfection and doing even more damage requiring emergent surgery. He will keep me informed as to his progress.

## 2013-03-14 ENCOUNTER — Telehealth: Payer: Self-pay | Admitting: Internal Medicine

## 2013-03-14 ENCOUNTER — Telehealth (INDEPENDENT_AMBULATORY_CARE_PROVIDER_SITE_OTHER): Payer: Self-pay

## 2013-03-14 ENCOUNTER — Telehealth: Payer: Self-pay

## 2013-03-14 ENCOUNTER — Encounter (INDEPENDENT_AMBULATORY_CARE_PROVIDER_SITE_OTHER): Payer: BC Managed Care – PPO | Admitting: Surgery

## 2013-03-14 NOTE — Telephone Encounter (Signed)
Pt states that he canceled his appt at [facility] due to the abcess busting  But he did state something about wants to have that anal abcess fistula procedure done but wants you to work on maybe short term disability or FLMA due to only being as his current job like 6 months  Dr. Susann Givens recommendation is that he needs to contact the surgery center and set up an appt about the anal fistula and he will work with you on missing work if you have the procedure

## 2013-03-14 NOTE — Telephone Encounter (Signed)
Patient states his abscess has ruptured and he is on abt . He asking to be rescheduled since the area is draining and on ABT . Patient has been rescheduled with DR. Blackman 03/21/13@11 :50 patient is aware

## 2013-03-14 NOTE — Telephone Encounter (Signed)
Pt was notified of Dr. Susann Givens recommendations and pt still has a follow-up appt with them 03/21/13 @ 11:30am to see whats is going on and he will ask them about the procedure

## 2013-03-14 NOTE — Telephone Encounter (Signed)
[facility] of [facility] [phone] he is to be there at 2;45 for a 3 0clock pt verbalized understanding

## 2013-03-21 ENCOUNTER — Encounter (INDEPENDENT_AMBULATORY_CARE_PROVIDER_SITE_OTHER): Payer: BC Managed Care – PPO | Admitting: Surgery

## 2013-05-05 ENCOUNTER — Encounter (INDEPENDENT_AMBULATORY_CARE_PROVIDER_SITE_OTHER): Payer: BC Managed Care – PPO | Admitting: Surgery

## 2013-05-08 ENCOUNTER — Ambulatory Visit (INDEPENDENT_AMBULATORY_CARE_PROVIDER_SITE_OTHER): Payer: BC Managed Care – PPO | Admitting: Family Medicine

## 2013-05-08 ENCOUNTER — Encounter: Payer: Self-pay | Admitting: Family Medicine

## 2013-05-08 VITALS — BP 130/80 | HR 88 | Wt 225.0 lb

## 2013-05-08 DIAGNOSIS — I10 Essential (primary) hypertension: Secondary | ICD-10-CM

## 2013-05-08 DIAGNOSIS — H8110 Benign paroxysmal vertigo, unspecified ear: Secondary | ICD-10-CM

## 2013-05-08 NOTE — Progress Notes (Signed)
  Subjective:    Patient ID: Roberto Bow., male    DOB: 12-Apr-1981, 32 y.o.   MRN: [id number]  HPI Over the last 2 months he's noted that when he goes from kneeling to standing or sitting to standing he will become dizzy. Sometimes when he climbs a ladder and looks up he gets dizzy. This weekend after he had a BM and stood up he became quite dizzy and passed out. He notes also intermittent difficulty with sweating.   Review of Systems     Objective:   Physical Exam alert and in no distress. EOMI. DTRs normal. Tympanic membranes and canals are normal. Throat is clear. Tonsils are normal. Neck is supple without adenopathy or thyromegaly. Cardiac exam shows a regular sinus rhythm without murmurs or gallops. Lungs are clear to auscultation. Dix-Hallpike maneuvers were positive especially with looking up to the left.        Assessment & Plan:  Hypertension  BPPV (benign paroxysmal positional vertigo) - Plan: Ambulatory referral to Physical Therapy  I will have him go to physical therapy for Epley maneuvers. I do think that some of his symptoms could also be orthostatic in nature.

## 2013-05-11 ENCOUNTER — Ambulatory Visit: Payer: BC Managed Care – PPO | Attending: Family Medicine | Admitting: Physical Therapy

## 2013-05-11 DIAGNOSIS — R42 Dizziness and giddiness: Secondary | ICD-10-CM | POA: Insufficient documentation

## 2013-05-11 DIAGNOSIS — IMO0001 Reserved for inherently not codable concepts without codable children: Secondary | ICD-10-CM | POA: Insufficient documentation

## 2013-05-19 ENCOUNTER — Ambulatory Visit: Payer: BC Managed Care – PPO | Admitting: Physical Therapy

## 2013-05-26 ENCOUNTER — Encounter: Payer: BC Managed Care – PPO | Admitting: Physical Therapy

## 2013-10-05 ENCOUNTER — Telehealth: Payer: Self-pay | Admitting: Family Medicine

## 2013-10-05 NOTE — Telephone Encounter (Signed)
Pt states that he has another perianal lesion. He states he would like to go ahead and be referred to [facility]. Pt states he would like to be seen, if possible, late today or first thing tomorrow. Please refer pt and call him with details. He can be reached at [phone].

## 2013-10-05 NOTE — Telephone Encounter (Signed)
See if you can get him an appointment for possible perirectal abscess

## 2013-10-06 ENCOUNTER — Ambulatory Visit (INDEPENDENT_AMBULATORY_CARE_PROVIDER_SITE_OTHER): Payer: 59 | Admitting: Surgery

## 2013-10-06 ENCOUNTER — Telehealth (INDEPENDENT_AMBULATORY_CARE_PROVIDER_SITE_OTHER): Payer: Self-pay | Admitting: Surgery

## 2013-10-06 ENCOUNTER — Other Ambulatory Visit: Payer: Self-pay | Admitting: Internal Medicine

## 2013-10-06 ENCOUNTER — Encounter (INDEPENDENT_AMBULATORY_CARE_PROVIDER_SITE_OTHER): Payer: Self-pay | Admitting: Surgery

## 2013-10-06 ENCOUNTER — Encounter (INDEPENDENT_AMBULATORY_CARE_PROVIDER_SITE_OTHER): Payer: Self-pay

## 2013-10-06 VITALS — BP 158/98 | HR 100 | Temp 98.9°F | Resp 15 | Ht 74.0 in | Wt 225.8 lb

## 2013-10-06 DIAGNOSIS — K611 Rectal abscess: Secondary | ICD-10-CM

## 2013-10-06 DIAGNOSIS — K603 Anal fistula, unspecified: Secondary | ICD-10-CM

## 2013-10-06 MED ORDER — AMOXICILLIN-POT CLAVULANATE 875-125 MG PO TABS: 1.0000 | ORAL_TABLET | Freq: Two times a day (BID) | ORAL | Status: DC

## 2013-10-06 NOTE — Patient Instructions (Signed)
See the Handout(s) we gave you.  Consider surgery to unroof/excise/or repair the fistula around your anus.    Please call our office at [phone] if you wish to schedule surgery or if you have further questions / concerns.   Anal Fistula An anal fistula is an abnormal tunnel that develops between the bowel and skin near the outside of the anus, where feces comes out. The anus has a number of tiny glands that make lubricating fluid. Sometimes these glands can become plugged and infected. This may lead to the development of a fluid-filled pocket (abscess). An anal fistula often develops after this infection or abscess. It is nearly always caused by a past or current anal abscess.  CAUSES  Though an anal fistula is almost always caused by a past or current anal abscess, other causes can include:  A complication of surgery.  Trauma to the rectal area.  Radiation to the area.  Other medical conditions or diseases, such as:   Chronic inflammatory bowel disease, such as Crohn disease or ulcerative colitis.   Colon or rectal cancer.   Diverticular disease, such as diverticulitis.   A sexually transmitted disease, such as gonorrhea, chlamydia, or syphilis.  An HIV infection or AIDS.  SYMPTOMS   Throbbing or constant pain that may be worse when sitting.   Swelling or irritation around the anus.   Drainage of pus or blood from an opening near the anus.   Pain with bowel movements.  Fever or chills. DIAGNOSIS  Your caregiver will examine the area to find the openings of the anal fistula and the fistula tract. The external opening of the anal fistula may be seen during a physical examination. Other examinations that may be performed include:   Examination of the rectal area with a gloved hand (digital rectal exam).   Examination with a probe or scope to help locate the internal opening of the fistula.   Injection of a dye into the fistula opening. X-rays can be taken  to find the exact location and path of the fistula.   An MRI or ultrasound of the anal area.  Other tests may be performed to find the cause of the anal fistula.   TREATMENT  The most common treatment for an anal fistula is surgery. There are different surgery options depending on where your fistula is located and how complex the fistula is. Surgical options include:  A fistulotomy. This surgery involves opening up the whole fistula and draining the contents inside to promote healing.  Seton placement. A silk string (seton) is placed into the fistula during a fistulotomy to drain any infection to promote healing.  Advancement flap procedure. Tissue is removed from your rectum or the skin around the anus and is attached to the opening of the fistula.  Bioprosthetic plug. A cone-shaped plug is made from your tissue and is used to block the opening of the fistula. Some anal fistulas do not require surgery. A fibrin glue is a non-surgical option that involves injecting the glue to seal the fistula. You also may be prescribed an antibiotic medicine to treat an infection.  HOME CARE INSTRUCTIONS   Take your antibiotics as directed. Finish them even if you start to feel better.  Only take over-the-counter or prescription medicines as directed by your caregiver.Use a stool softener or laxative, if recommended.   Eat a high-fiber diet to help avoid constipation or as directed by your caregiver.  Drink enough water to keep your urine clear or  pale yellow.   A warm sitz bath may be soothing and help with healing. You may take warm sitz baths for 15 20 minutes, 3 4 times a day to ease pain and discomfort.   Follow excellent hygiene to keep the anal area as clean and dry as possible. Use wet toilet paper or moist towelettes after each bowel movement.  SEEK MEDICAL CARE IF: You have increased pain not controlled with medicines.  SEEK IMMEDIATE MEDICAL CARE IF:  You have severe, intolerable  pain.  You have new swelling, redness, or discharge around the anal area.  You have tenderness or warmth around the anal area.  You have chills or diarrhea.  You have severe problems urinating or having a bowel movement.   You have a fever or persistent symptoms for more than 2 3 days.   You have a fever and your symptoms suddenly get worse.  MAKE SURE YOU:   Understand these instructions.  Will watch your condition.  Will get help right away if you are not doing well or get worse. Document Released: 08/20/2008 Document Revised: 08/24/2012 Document Reviewed: 07/13/2011 [facility] Patient Information 2014 [place], [place].  ANORECTAL SURGERY: POST OP INSTRUCTIONS  1. Take your usually prescribed home medications unless otherwise directed. 2. DIET: Follow a light bland diet the first 24 hours after arrival home, such as soup, liquids, crackers, etc.  Be sure to include lots of fluids daily.  Avoid fast food or heavy meals as your are more likely to get nauseated.  Eat a low fat the next few days after surgery.   3. PAIN CONTROL: a. Pain is best controlled by a usual combination of three different methods TOGETHER: i. Ice/Heat ii. Over the counter pain medication iii. Prescription pain medication b. Most patients will experience some swelling and discomfort in the anus/rectal area. and incisions.  Ice packs or heat (30-60 minutes up to 6 times a day) will help. Use ice for the first few days to help decrease swelling and bruising, then switch to heat such as warm towels, sitz baths, warm baths, etc to help relax tight/sore spots and speed recovery.  Some people prefer to use ice alone, heat alone, alternating between ice & heat.  Experiment to what works for you.  Swelling and bruising can take several weeks to resolve.   c. It is helpful to take an over-the-counter pain medication regularly for the first few weeks.  Choose one of the following that works best for you: i. Naproxen  (Aleve, etc)  Two 220mg  tabs twice a day ii. Ibuprofen (Advil, etc) Three 200mg  tabs four times a day (every meal & bedtime) iii. Acetaminophen (Tylenol, etc) 500-650mg  four times a day (every meal & bedtime) d. A  prescription for pain medication (such as oxycodone, hydrocodone, etc) should be given to you upon discharge.  Take your pain medication as prescribed.  i. If you are having problems/concerns with the prescription medicine (does not control pain, nausea, vomiting, rash, itching, etc), please call us [phone] to see if we need to switch you to a different pain medicine that will work better for you and/or control your side effect better. ii. If you need a refill on your pain medication, please contact your pharmacy.  They will contact our office to request authorization. Prescriptions will not be filled after 5 pm or on week-ends.  Use a Sitz Bath 4-8 times a day for relief A sitz bath is a warm water bath taken in the sitting position  that covers only the hips and buttocks. It may be used for either healing or hygiene purposes. Sitz baths are also used to relieve pain, itching, or muscle spasms. The water may contain medicine. Moist heat will help you heal and relax.  HOME CARE INSTRUCTIONS  Take 3 to 4 sitz baths a day. 1. Fill the bathtub half full with warm water. 2. Sit in the water and open the drain a little. 3. Turn on the warm water to keep the tub half full. Keep the water running constantly. 4. Soak in the water for 15 to 20 minutes. 5. After the sitz bath, pat the affected area dry first. SEEK MEDICAL CARE IF:  You get worse instead of better. Stop the sitz baths if you get worse.   4. KEEP YOUR BOWELS REGULAR a. The goal is one bowel movement a day b. Avoid getting constipated.  Between the surgery and the pain medications, it is common to experience some constipation.  Increasing fluid intake and taking a fiber supplement (such as Metamucil, Citrucel, FiberCon,  MiraLax, etc) 1-2 times a day regularly will usually help prevent this problem from occurring.  A mild laxative (prune juice, Milk of Magnesia, MiraLax, etc) should be taken according to package directions if there are no bowel movements after 48 hours. c. Watch out for diarrhea.  If you have many loose bowel movements, simplify your diet to bland foods & liquids for a few days.  Stop any stool softeners and decrease your fiber supplement.  Switching to mild anti-diarrheal medications (Kayopectate, Pepto Bismol) can help.  If this worsens or does not improve, please call us.  5. Wound Care a. Remove your bandages the day after surgery.  Unless discharge instructions indicate otherwise, leave your bandage dry and in place overnight.  Remove the bandage during your first bowel movement.   b. Allow the wound packing to fall out over the next few days.  You can trim exposed gauze / ribbon as it falls out.  You do not need to repack the wound unless instructed otherwise.  Wear an absorbent pad or soft cotton gauze in your underwear as needed to catch any drainage and help keep the area  c. Keep the area clean and dry.  Bathe / shower every day.  Keep the area clean by showering / bathing over the incision / wound.   It is okay to soak an open wound to help wash it.  Wet wipes or showers / gentle washing after bowel movements is often less traumatic than regular toilet paper. d. Bonita Quin may have some styrofoam-like soft packing in the rectum which will come out with the first bowel movement.  e. You will often notice bleeding with bowel movements.  This should slow down by the end of the first week of surgery f. Expect some drainage.  This should slow down, too, by the end of the first week of surgery.  Wear an absorbent pad or soft cotton gauze in your underwear until the drainage stops. 6. ACTIVITIES as tolerated:   a. You may resume regular (light) daily activities beginning the next day-such as daily self-care,  walking, climbing stairs-gradually increasing activities as tolerated.  If you can walk 30 minutes without difficulty, it is safe to try more intense activity such as jogging, treadmill, bicycling, low-impact aerobics, swimming, etc. b. Save the most intensive and strenuous activity for last such as sit-ups, heavy lifting, contact sports, etc  Refrain from any heavy lifting or straining until  you are off narcotics for pain control.   c. DO NOT PUSH THROUGH PAIN.  Let pain be your guide: If it hurts to do something, don't do it.  Pain is your body warning you to avoid that activity for another week until the pain goes down. d. You may drive when you are no longer taking prescription pain medication, you can comfortably sit for long periods of time, and you can safely maneuver your car and apply brakes. e. Bonita Quin may have sexual intercourse when it is comfortable.  7. FOLLOW UP in our office a. Please call [facility] at [phone] to set up an appointment to see your surgeon in the office for a follow-up appointment approximately 2 weeks after your surgery. b. Make sure that you call for this appointment the day you arrive home to insure a convenient appointment time. 10. IF YOU HAVE DISABILITY OR FAMILY LEAVE FORMS, BRING THEM TO THE OFFICE FOR PROCESSING.  DO NOT GIVE THEM TO YOUR DOCTOR.        WHEN TO CALL us [phone]: 1. Poor pain control 2. Reactions / problems with new medications (rash/itching, nausea, etc)  3. Fever over 101.5 F (38.5 C) 4. Inability to urinate 5. Nausea and/or vomiting 6. Worsening swelling or bruising 7. Continued bleeding from incision. 8. Increased pain, redness, or drainage from the incision  The clinic staff is available to answer your questions during regular business hours (8:30am-5pm).  Please don't hesitate to call and ask to speak to one of our nurses for clinical concerns.   A surgeon from [facility] is always on call at the hospitals   If  you have a medical emergency, go to the nearest emergency room or call 911.    [facility] [address], [place], [place]  [address] ? MAIN: [phone] ? TOLL FREE: [phone] ? FAX [phone] [email or web address]  GETTING TO GOOD BOWEL HEALTH. Irregular bowel habits such as constipation and diarrhea can lead to many problems over time.  Having one soft bowel movement a day is the most important way to prevent further problems.  The anorectal canal is designed to handle stretching and feces to safely manage our ability to get rid of solid waste (feces, poop, stool) out of our body.  BUT, hard constipated stools can act like ripping concrete bricks and diarrhea can be a burning fire to this very sensitive area of our body, causing inflamed hemorrhoids, anal fissures, increasing risk is perirectal abscesses, abdominal pain/bloating, an making irritable bowel worse.     The goal: ONE SOFT BOWEL MOVEMENT A DAY!  To have soft, regular bowel movements:    Drink at least 8 tall glasses of water a day.     Take plenty of fiber.  Fiber is the undigested part of plant food that passes into the colon, acting s "natures broom" to encourage bowel motility and movement.  Fiber can absorb and hold large amounts of water. This results in a larger, bulkier stool, which is soft and easier to pass. Work gradually over several weeks up to 6 servings a day of fiber (25g a day even more if needed) in the form of: o Vegetables -- Root (potatoes, carrots, turnips), leafy green (lettuce, salad greens, celery, spinach), or cooked high residue (cabbage, broccoli, etc) o Fruit -- Fresh (unpeeled skin & pulp), Dried (prunes, apricots, cherries, etc ),  or stewed ( applesauce)  o Whole grain breads, pasta, etc (whole wheat)  o Bran cereals    Bulking Agents -- This type of water-retaining fiber generally is easily obtained each day by one of the following:  o Psyllium  bran -- The psyllium plant is remarkable because its ground seeds can retain so much water. This product is available as Metamucil, Konsyl, Effersyllium, Per Diem Fiber, or the less expensive generic preparation in drug and health food stores. Although labeled a laxative, it really is not a laxative.  o Methylcellulose -- This is another fiber derived from wood which also retains water. It is available as Citrucel. o Polyethylene Glycol - and "artificial" fiber commonly called Miralax or Glycolax.  It is helpful for people with gassy or bloated feelings with regular fiber o Flax Seed - a less gassy fiber than psyllium   No reading or other relaxing activity while on the toilet. If bowel movements take longer than 5 minutes, you are too constipated   AVOID CONSTIPATION.  High fiber and water intake usually takes care of this.  Sometimes a laxative is needed to stimulate more frequent bowel movements, but    Laxatives are not a good long-term solution as it can wear the colon out. o Osmotics (Milk of Magnesia, Fleets phosphosoda, Magnesium citrate, MiraLax, GoLytely) are safer than  o Stimulants (Senokot, Castor Oil, Dulcolax, Ex Lax)    o Do not take laxatives for more than 7days in a row.    IF SEVERELY CONSTIPATED, try a Bowel Retraining Program: o Do not use laxatives.  o Eat a diet high in roughage, such as bran cereals and leafy vegetables.  o Drink six (6) ounces of prune or apricot juice each morning.  o Eat two (2) large servings of stewed fruit each day.  o Take one (1) heaping tablespoon of a psyllium-based bulking agent twice a day. Use sugar-free sweetener when possible to avoid excessive calories.  o Eat a normal breakfast.  o Set aside 15 minutes after breakfast to sit on the toilet, but do not strain to have a bowel movement.  o If you do not have a bowel movement by the third day, use an enema and repeat the above steps.    Controlling diarrhea o Switch to liquids and simpler foods  for a few days to avoid stressing your intestines further. o Avoid dairy products (especially milk & ice cream) for a short time.  The intestines often can lose the ability to digest lactose when stressed. o Avoid foods that cause gassiness or bloating.  Typical foods include beans and other legumes, cabbage, broccoli, and dairy foods.  Every person has some sensitivity to other foods, so listen to our body and avoid those foods that trigger problems for you. o Adding fiber (Citrucel, Metamucil, psyllium, Miralax) gradually can help thicken stools by absorbing excess fluid and retrain the intestines to act more normally.  Slowly increase the dose over a few weeks.  Too much fiber too soon can backfire and cause cramping & bloating. o Probiotics (such as active yogurt, Align, etc) may help repopulate the intestines and colon with normal bacteria and calm down a sensitive digestive tract.  Most studies show it to be of mild help, though, and such products can be costly. o Medicines:   Bismuth subsalicylate (ex. Kayopectate, Pepto Bismol) every 30 minutes for up to 6 doses can help control diarrhea.  Avoid if pregnant.   Loperamide (Immodium) can slow down diarrhea.  Start with two tablets (4mg  total) first and then try one tablet every  6 hours.  Avoid if you are having fevers or severe pain.  If you are not better or start feeling worse, stop all medicines and call your doctor for advice o Call your doctor if you are getting worse or not better.  Sometimes further testing (cultures, endoscopy, X-ray studies, bloodwork, etc) may be needed to help diagnose and treat the cause of the diarrhea. o

## 2013-10-06 NOTE — Telephone Encounter (Signed)
Patient met with surgery scheduling went over financial responsibilities, patient will call back to schedule

## 2013-10-06 NOTE — Progress Notes (Signed)
Subjective:     Patient ID: Roberto Bow., male   DOB: 1980-11-09, 33 y.o.   MRN: [id number]  HPI  Note: This dictation was prepared with Dragon/digital dictation along with [facility] technology. Any transcriptional errors that result from this process are unintentional.       Roberto Lewis.  1981-05-15 [id number]  Patient Care Team: Ronnald Nian, MD as PCP - General (Family Medicine)  This patient is a 33 y.o.male who presents today for surgical evaluation at the request of Dr. Susann Givens.   Reason for visit: Recurrent anal pain.  Concern for fistula.  Pleasant male with history of anal abscesses.  Had incision and drainage2013 & 2013 x2 by our group.  It was recommended for examination under anesthesia to look for a fistula.  He had switched jobs and financial issues so he held off.  He has had a couple more flares of pain and swelling in 2014, but then it would "bust up and drain on its own" and feel better.  However, he feels like another one is coming on again.  He discuss with his primary care physician.  I recommended surgical evaluation.  The patient wished to be seen urgently, so we could him in today.  He is not on any antibiotics.  He claims to have 3 bowel movements a day.  No personal nor family history of GI/colon cancer, inflammatory bowel disease, irritable bowel syndrome, allergy such as Celiac Sprue, dietary/dairy problems, colitis, ulcers nor gastritis.  No recent sick contacts/gastroenteritis.  No travel outside the country.  No changes in diet.  No dysphagia to solids or liquids.  No significant heartburn or reflux.  No hematochezia, hematemesis, coffee ground emesis.  No evidence of prior gastric/peptic ulceration.    Patient Active Problem List   Diagnosis Date Noted  . 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 Abscess of anal and rectal regions 09/26/2012  . Hypertension 08/09/2012  . Migraine     Past Medical History  Diagnosis Date  . Migraine   . Anxiety      (f/b by psych)  . Smoker     Past Surgical History  Procedure Laterality Date  . Incision and drainage abscess anal      History   Social History  . Marital Status: Married    Spouse Name: N/A    Number of Children: 1  . Years of Education: N/A   Occupational History  . landscaper    Social History Main Topics  . Smoking status: Former Smoker -- 1.00 packs/day    Types: Cigarettes    Quit date: 03/29/2011  . Smokeless tobacco: Never Used  . Alcohol Use: No  . Drug Use: No  . Sexual Activity: Not on file   Other Topics Concern  . Not on file   Social History Narrative  . No narrative on file    History reviewed. No pertinent family history.  Current Outpatient Prescriptions  Medication Sig Dispense Refill  . ARIPiprazole (ABILIFY) 10 MG tablet Take 10 mg by mouth daily.        . divalproex (DEPAKOTE) 250 MG 24 hr tablet Take 250 mg by mouth daily.        [name] lisinopril-hydrochlorothiazide (PRINZIDE,ZESTORETIC) 10-12.5 MG per tablet Take 1 tablet by mouth daily.  90 tablet  3  . amoxicillin-clavulanate (AUGMENTIN) 875-125 MG per tablet Take 1 tablet by mouth 2 (two) times daily.  14 tablet  3   No current facility-administered medications for this visit.  Allergies  Allergen Reactions  . Effexor [Venlafaxine Hydrochloride] Other (See Comments)    Throat swelling, blurred vision    BP 158/98  Pulse 100  Temp(Src) 98.9 F (37.2 C) (Temporal)  Resp 15  Ht 6\' 2"  (1.88 m)  Wt 225 lb 12.8 oz (102.422 kg)  BMI 28.98 kg/m2  No results found.   Review of Systems  Constitutional: Negative for fever, chills and diaphoresis.  HENT: Negative for ear discharge, facial swelling, mouth sores, nosebleeds, sore throat and trouble swallowing.   Eyes: Negative for photophobia, discharge and visual disturbance.  Respiratory: Negative for choking, chest tightness, shortness of breath and stridor.   Cardiovascular: Negative for chest pain and palpitations.    Gastrointestinal: Positive for rectal pain. Negative for nausea, vomiting, abdominal pain, diarrhea, constipation, blood in stool, abdominal distention and anal bleeding.  Endocrine: Negative for cold intolerance and heat intolerance.  Genitourinary: Negative for dysuria, urgency, difficulty urinating and testicular pain.  Musculoskeletal: Negative for arthralgias, back pain, gait problem and myalgias.  Skin: Negative for color change, pallor, rash and wound.  Allergic/Immunologic: Negative for environmental allergies and food allergies.  Neurological: Negative for dizziness, speech difficulty, weakness, numbness and headaches.  Hematological: Negative for adenopathy. Does not bruise/bleed easily.  Psychiatric/Behavioral: Negative for hallucinations, confusion and agitation.       Objective:   Physical Exam  Constitutional: He is oriented to person, place, and time. He appears well-developed and well-nourished. No distress.  HENT:  Head: Normocephalic.  Mouth/Throat: Oropharynx is clear and moist. No oropharyngeal exudate.  Eyes: Conjunctivae and EOM are normal. Pupils are equal, round, and reactive to light. No scleral icterus.  Neck: Normal range of motion. Neck supple. No tracheal deviation present.  Cardiovascular: Normal rate, regular rhythm and intact distal pulses.   Pulmonary/Chest: Effort normal and breath sounds normal. No respiratory distress.  Abdominal: Soft. He exhibits no distension. There is no tenderness. Hernia confirmed negative in the right inguinal area and confirmed negative in the left inguinal area.  Genitourinary:     Exam done with assistance of male Medical Assistant in the room.  Perianal skin clean with good hygiene.  No pruritis.  No pilonidal disease.  No fissure.  External hemorrhoids x2 anterior small.  Very tender.  No fluctance/mass/abscess  Normal sphincter tone. Tolerates digital and anoscopic rectal exam.  No rectal masses.  Hemorrhoidal piles  WNL   Musculoskeletal: Normal range of motion. He exhibits no tenderness.  Lymphadenopathy:    He has no cervical adenopathy.       Right: No inguinal adenopathy present.       Left: No inguinal adenopathy present.  Neurological: He is alert and oriented to person, place, and time. No cranial nerve deficit. He exhibits normal muscle tone. Coordination normal.  Skin: Skin is warm and dry. No rash noted. He is not diaphoretic. No erythema. No pallor.  Psychiatric: He has a normal mood and affect. His behavior is normal. Judgment and thought content normal.       Assessment:     Recurrent anterior anal pain with history of prior abscesses.  Intermittent drainage.  Strongly suspicious for fistula     Plan:     There is no specific fluctuant mass to drain right now.  Perhaps we are catching this early.  I recommend he start antibiotics.  Augmentin for the next 7 days.  If it does not improve or worsens, return for incision and drainage in the office.  I did discuss trying to do  incision and drainage now although I Could not feel on rectal exam or externally a specific fluctuant abscess to drain.  He was okay to hold off for now.  I strongly recommend he get OR examination under anesthesia for probable fistulotomy.  Hopefully this is superficial and does not require any intersphincteric repair/LIFT.  If he does develop an abscess, may be a stage procedure of drain the abscess and then coming back to do a fistula repair at a later time to avoid stricturing.  However, given his history of disappearing and mediocre compliance, I think we need to be aggressive intrying help him get through this when he can.  He claims he is a better place with work and should be able to get some time off.  He is worried about getting back to work soon.  I cautioned him this can be a painful procedure and it is not a work return to work.  I did discuss the procedure with him:   The anatomy & physiology of the anorectal  region was discussed.  We discussed the pathophysiology of anorectal abscess and fistula.  Differential diagnosis was discussed.  Natural history progression was discussed.   I stressed the importance of a bowel regimen to have daily soft bowel movements to minimize progression of disease.     The patient's condition is not adequately controlled.  Non-operative treatment has not healed the fistula.  Therefore, I recommended examination under anaesthesia to confirm the diagnosis and treat the fistula.  I discussed techniques that may be required such as fistulotomy, ligation by LIFT technique, and/or seton placement.  Benefits & alternatives discussed.  I noted a good likelihood this will help address the problem, but sometimes repeat operations and prolonged healing times may occur.  Risks such as bleeding, pain, recurrence, reoperation, incontinence, heart attack, death, and other risks were discussed.      Educational handouts further explaining the pathology, treatment options, and bowel regimen were given.  The patient expressed understanding & wishes to think about it & call back.  We will work to coordinate surgery for a mutually convenient time.

## 2013-10-10 NOTE — Telephone Encounter (Signed)
Spoke with pt went to Surgery center, abx was prescribed and suggested that pt have surgery. Pt states when he able to afford the procedure he will get it sch.

## 2013-10-20 ENCOUNTER — Ambulatory Visit (INDEPENDENT_AMBULATORY_CARE_PROVIDER_SITE_OTHER): Payer: 59 | Admitting: Family Medicine

## 2013-10-20 ENCOUNTER — Other Ambulatory Visit: Payer: Self-pay | Admitting: Family Medicine

## 2013-10-20 ENCOUNTER — Encounter: Payer: Self-pay | Admitting: Family Medicine

## 2013-10-20 VITALS — BP 134/92 | HR 107 | Wt 227.0 lb

## 2013-10-20 DIAGNOSIS — R5381 Other malaise: Secondary | ICD-10-CM

## 2013-10-20 DIAGNOSIS — F419 Anxiety disorder, unspecified: Secondary | ICD-10-CM

## 2013-10-20 DIAGNOSIS — R110 Nausea: Secondary | ICD-10-CM

## 2013-10-20 DIAGNOSIS — F411 Generalized anxiety disorder: Secondary | ICD-10-CM

## 2013-10-20 DIAGNOSIS — R5383 Other fatigue: Secondary | ICD-10-CM

## 2013-10-20 LAB — CBC WITH DIFFERENTIAL/PLATELET
BASOS ABS: 0.1 10*3/uL (ref 0.0–0.1)
Basophils Relative: 1 % (ref 0–1)
Eosinophils Absolute: 0.2 10*3/uL (ref 0.0–0.7)
Eosinophils Relative: 2 % (ref 0–5)
HCT: 45.2 % (ref 39.0–52.0)
Hemoglobin: 15.8 g/dL (ref 13.0–17.0)
Lymphocytes Relative: 33 % (ref 12–46)
Lymphs Abs: 3 10*3/uL (ref 0.7–4.0)
MCH: 31.2 pg (ref 26.0–34.0)
MCHC: 35 g/dL (ref 30.0–36.0)
MCV: 89.2 fL (ref 78.0–100.0)
Monocytes Absolute: 0.7 10*3/uL (ref 0.1–1.0)
Monocytes Relative: 8 % (ref 3–12)
NEUTROS ABS: 5.1 10*3/uL (ref 1.7–7.7)
Neutrophils Relative %: 56 % (ref 43–77)
PLATELETS: 328 10*3/uL (ref 150–400)
RBC: 5.07 MIL/uL (ref 4.22–5.81)
RDW: 12.7 % (ref 11.5–15.5)
WBC: 9.1 10*3/uL (ref 4.0–10.5)

## 2013-10-20 LAB — COMPREHENSIVE METABOLIC PANEL
ALK PHOS: 63 U/L (ref 39–117)
ALT: 67 U/L — ABNORMAL HIGH (ref 0–53)
AST: 43 U/L — AB (ref 0–37)
Albumin: 4.7 g/dL (ref 3.5–5.2)
BUN: 17 mg/dL (ref 6–23)
CO2: 28 mEq/L (ref 19–32)
Calcium: 9.7 mg/dL (ref 8.4–10.5)
Chloride: 98 mEq/L (ref 96–112)
Creat: 0.99 mg/dL (ref 0.50–1.35)
Glucose, Bld: 84 mg/dL (ref 70–99)
POTASSIUM: 4.1 meq/L (ref 3.5–5.3)
SODIUM: 137 meq/L (ref 135–145)
TOTAL PROTEIN: 7.4 g/dL (ref 6.0–8.3)
Total Bilirubin: 0.5 mg/dL (ref 0.2–1.2)

## 2013-10-20 LAB — TSH: TSH: 1.295 u[IU]/mL (ref 0.350–4.500)

## 2013-10-20 NOTE — Progress Notes (Signed)
   Subjective:    Patient ID: Roberto BowWilliam Nuon Jr., male    DOB: [birth year], 33 y.o.   MRN: [id number]  HPI Month history of difficulty with fatigue, nausea, and a flushed in the face, excessive sweating. No fever, chills, sore throat, cough or congestion skin or hair changes. He also complains of excessive sweating especially in his feet and armpits. He continues to gain weight and thinks it is related to his psychotropic medications. No abdominal problems. Psychologically things are going well. His marriage and work are going well.He does not use street drugs. He continues on medications listed in the chart and does get routine followup with psychiatry.  Review of Systems     Objective:   Physical Exam alert and in no distress. Tympanic membranes and canals are normal. Throat is clear. Tonsils are normal. Neck is supple without adenopathy or thyromegaly. Cardiac exam shows a regular sinus rhythm without murmurs or gallops. Lungs are clear to auscultation. Skin is normal. DTRs normal. Abdominal exam shows no masses or tenderness with normal bowel sounds       Assessment & Plan:  Nausea alone - Plan: CBC with Differential, Comprehensive metabolic panel, TSH  Fatigue - Plan: CBC with Differential, Comprehensive metabolic panel, TSH  Anxiety disorder - Plan: CBC with Differential, Comprehensive metabolic panel, TSH  I will get blood work on him however discussed the fact that this could possibly be related to his medications. If the blood work comes back negative I will have him discuss his symptoms with his psychiatrist.

## 2013-10-23 LAB — HEPATITIS B CORE ANTIBODY, TOTAL: Hep B Core Total Ab: NONREACTIVE

## 2013-10-23 LAB — HEPATITIS A ANTIBODY, TOTAL: HEP A TOTAL AB: NONREACTIVE

## 2013-12-12 ENCOUNTER — Telehealth: Payer: Self-pay | Admitting: Family Medicine

## 2013-12-12 NOTE — Telephone Encounter (Signed)
Explain that the cost for the psychiatrist  will be roughly the same. My recommendation is to stay with Dr. Nolen MuMcKinney

## 2013-12-12 NOTE — Telephone Encounter (Signed)
Pt.notified

## 2013-12-12 NOTE — Telephone Encounter (Signed)
Pt called and stated he is not happy with the docs that handle his mental health. He states he has to see a different doc every time and he just doesn't like that. He would like to see the same doc. Pt states he did try Dr. Nolen MuMckinney and she ws just too expensive. He would like some suggestions of a couple of docs he could try to get in with. Please inform pt of who you would recommend.

## 2014-02-25 ENCOUNTER — Other Ambulatory Visit: Payer: Self-pay | Admitting: Family Medicine

## 2014-05-21 ENCOUNTER — Encounter: Payer: Self-pay | Admitting: Internal Medicine

## 2014-07-02 ENCOUNTER — Encounter (HOSPITAL_COMMUNITY): Payer: Self-pay | Admitting: Emergency Medicine

## 2014-07-02 ENCOUNTER — Emergency Department (INDEPENDENT_AMBULATORY_CARE_PROVIDER_SITE_OTHER)
Admission: EM | Admit: 2014-07-02 | Discharge: 2014-07-02 | Disposition: A | Discharge status: Home / Self Care | Payer: 59 | Source: Home / Self Care | Attending: Emergency Medicine | Admitting: Emergency Medicine

## 2014-07-02 DIAGNOSIS — K610 Anal abscess: Secondary | ICD-10-CM

## 2014-07-02 MED ORDER — LIDOCAINE HCL (PF) 2 % IJ SOLN
INTRAMUSCULAR | Status: AC
  Filled 2014-07-02: qty 2

## 2014-07-02 MED ORDER — SULFAMETHOXAZOLE-TMP DS 800-160 MG PO TABS: 1.0000 | ORAL_TABLET | Freq: Two times a day (BID) | ORAL | Status: DC

## 2014-07-02 NOTE — Discharge Instructions (Signed)
I was not able to drain any pus from your abscess.  I think this is because the pus is closer to your anus than I am comfortable making an incision.  Start the Bactrim - 1 pill twice a day.  [facility].

## 2014-07-02 NOTE — ED Provider Notes (Signed)
CSN: [id number]     Arrival date & time 07/02/14  0808 History   First MD Initiated Contact with Patient 07/02/14 [phone]     Chief Complaint  Patient presents with  . Abscess   (Consider location/radiation/quality/duration/timing/severity/associated sxs/prior Treatment) HPI He is a 33 year old man here for evaluation of perianal abscess. He states this is a recurrent problem for the last 2 years. He is typically seen at the surgical Center for this, and they believe this is likely associated with a fistula. Last episode was 4-5 months ago. The current episode started about one week ago. He states he currently feels like it's the size of a golf ball. Denies any fevers or chills. No nausea or vomiting. There's been no drainage.  Past Medical History  Diagnosis Date  . Migraine   . Anxiety     (f/b by psych)  . Smoker    Past Surgical History  Procedure Laterality Date  . Incision and drainage abscess anal  04/10/2011    Dr Daphine DeutscherMartin  . Incision and drainage abscess anal  May 2013    Dr Magnus IvanBlackman  . Incision and drainage abscess anal  09/15/2012    Dr Biagio QuintLayton   No family history on file. History  Substance Use Topics  . Smoking status: Former Smoker -- 1.00 packs/day    Types: Cigarettes    Quit date: 03/29/2011  . Smokeless tobacco: Never Used  . Alcohol Use: No    Review of Systems  Constitutional: Negative for fever and chills.  Gastrointestinal: Negative for nausea and vomiting.  Skin: Positive for wound (abscess).    Allergies  Effexor  Home Medications   Prior to Admission medications   Medication Sig Start Date End Date Taking? Authorizing Provider  ARIPiprazole (ABILIFY) 10 MG tablet Take 10 mg by mouth daily.      Historical Provider, MD  divalproex (DEPAKOTE) 250 MG 24 hr tablet Take 250 mg by mouth daily.      Historical Provider, MD  lisinopril-hydrochlorothiazide (PRINZIDE,ZESTORETIC) 10-12.5 MG per tablet Take one tablet by mouth one time daily 02/26/14   Ronnald NianJohn C  Lalonde, MD  sulfamethoxazole-trimethoprim (BACTRIM DS) 800-160 MG per tablet Take 1 tablet by mouth 2 (two) times daily. 07/02/14   Charm RingsErin J Quintel Mccalla, MD   BP 141/99  Pulse 106  Temp(Src) 98.1 F (36.7 C) (Oral)  Resp 18  Ht 6\' 2"  (1.88 m)  Wt 237 lb (107.502 kg)  BMI 30.42 kg/m2  SpO2 97% Physical Exam  Constitutional: He appears well-developed and well-nourished. No distress.  Skin: He is not diaphoretic.  1.5cm diameter area of erythema with central fluctuance located perianally at 7 o'clock    ED Course  INCISION AND DRAINAGE Date/Time: 07/02/2014 9:07 AM Performed by: Charm RingsHONIG, Charlesetta Milliron J Authorized by: Charm RingsHONIG, Sadi Arave J Consent: Verbal consent obtained. Risks and benefits: risks, benefits and alternatives were discussed Consent given by: patient Patient understanding: patient states understanding of the procedure being performed Patient identity confirmed: verbally with patient Time out: Immediately prior to procedure a "time out" was called to verify the correct patient, procedure, equipment, support staff and site/side marked as required. Type: abscess Body area: anogenital Location details: perianal Anesthesia: local infiltration Local anesthetic: lidocaine 2% without epinephrine Anesthetic total: 2 ml Scalpel size: 11 Incision type: single straight Drainage characteristics: unable to express any purulent drainage. Patient tolerance: Patient tolerated the procedure well with no immediate complications.   (including critical care time) Labs Review Labs Reviewed - No data to display  Imaging  Review No results found.   MDM   1. Perianal abscess    I attempted an I&D. Unfortunately I was unable to express any drainage. Based on my exam, I believe this is because the abscess is located closer to the anal ring and I was comfortable making an incision. Patient started on Bactrim for 10 days. Wound care discussed, okay to continue sitz baths. [facility] as soon as possible.    Charm RingsErin J Rodneisha Bonnet, MD 07/02/14 [phone]

## 2014-07-02 NOTE — ED Notes (Signed)
Patient reports abscess, recurrent abscess.  Typically goes to surgical center, but that not possible today.  Describes abscess between rectum and testicles

## 2014-07-03 ENCOUNTER — Other Ambulatory Visit: Payer: Self-pay

## 2014-07-03 DIAGNOSIS — K610 Anal abscess: Secondary | ICD-10-CM

## 2014-07-16 ENCOUNTER — Other Ambulatory Visit (INDEPENDENT_AMBULATORY_CARE_PROVIDER_SITE_OTHER): Payer: Self-pay | Admitting: General Surgery

## 2014-07-16 NOTE — H&P (Signed)
[facility]16[facility] 9:26 AM Location: [facility] Patient #: [id number] DOB: September 11, 1981 Married / Language: [place] / Race: White Male History of Present Illness Roberto Lewis([name] MD; 07/16/2014 10:27 AM) Patient words: recheck perianal abscess.  The patient is a 33 year old male who presents with anal pain. his is a 33 year old male who presents to the office approximately 3 weeks after abscess incision and drainage. Over the last 2-3 years he has had 6-7 episodes of anal rectal abscess located in the same position. these have been drained in the office. He also occasionally has episodes of less severe swelling and pain that rupture at home with purulent drainage. He denies any regular anal rectal drainage. Other Problems Roberto Lewis([name], MD; 07/16/2014 10:30 AM) Anxiety Disorder High blood pressure ANAL PAIN (569.42  K62.89) This is a 33 yo M with recurrent perianal abscesses. PERIRECTAL ABSCESS (566  K61.1)  Diagnostic Studies History Roberto Lewis([name], MD; 07/16/2014 10:30 AM) Colonoscopy never  Allergies Roberto Lewis([name], MD; 07/16/2014 10:30 AM) Effexor *ANTIDEPRESSANTS*  Medication History Roberto Lewis([name], MD; 07/16/2014 10:30 AM) TraMADol HCl (50MG  Tablet, 1 (one) Tablet Oral every four hours, as needed, Taken starting 07/03/2014) Active. Depakote ER (250MG  Tablet ER 24HR, Oral daily) Active. Abilify (10MG  Tablet, Oral daily) Active. Lisinopril-Hydrochlorothiazide (10-12.5MG  Tablet, Oral daily) Active.  Social History Roberto Lewis([name], MD; 07/16/2014 10:30 AM) Alcohol use Remotely quit alcohol use. Caffeine use Carbonated beverages, Coffee, Tea. Illicit drug use Remotely quit drug use. Tobacco use Former smoker.  Family History Roberto Lewis([name], MD; 07/16/2014 10:30 AM) Bleeding disorder Father. Diabetes Mellitus Family Members In General. Hypertension Father, Mother.     Review of Systems Roberto Lewis([name] MD; 07/16/2014 10:30 AM) General  Not Present- Appetite Loss, Chills, Fatigue, Fever, Night Sweats, Weight Gain and Weight Loss. Skin Present- Non-Healing Wounds. Not Present- Change in Wart/Mole, Dryness, Hives, Jaundice, New Lesions, Rash and Ulcer. HEENT Not Present- Earache, Hearing Loss, Hoarseness, Nose Bleed, Oral Ulcers, Ringing in the Ears, Seasonal Allergies, Sinus Pain, Sore Throat, Visual Disturbances, Wears glasses/contact lenses and Yellow Eyes. Respiratory Not Present- Bloody sputum, Chronic Cough, Difficulty Breathing, Snoring and Wheezing. Breast Not Present- Breast Mass, Breast Pain, Nipple Discharge and Skin Changes. Cardiovascular Not Present- Chest Pain, Difficulty Breathing Lying Down, Leg Cramps, Palpitations, Rapid Heart Rate, Shortness of Breath and Swelling of Extremities. Gastrointestinal Present- Nausea. Not Present- Abdominal Pain, Bloating, Bloody Stool, Change in Bowel Habits, Chronic diarrhea, Constipation, Difficulty Swallowing, Excessive gas, Gets full quickly at meals, Hemorrhoids, Indigestion, Rectal Pain and Vomiting. Male Genitourinary Not Present- Blood in Urine, Change in Urinary Stream, Frequency, Impotence, Nocturia, Painful Urination, Urgency and Urine Leakage. Musculoskeletal Not Present- Back Pain, Joint Pain, Joint Stiffness, Muscle Pain, Muscle Weakness and Swelling of Extremities. Neurological Not Present- Decreased Memory, Fainting, Headaches, Numbness, Seizures, Tingling, Tremor, Trouble walking and Weakness. Psychiatric Present- Anxiety. Not Present- Bipolar, Change in Sleep Pattern, Depression, Fearful and Frequent crying. Endocrine Not Present- Cold Intolerance, Excessive Hunger, Hair Changes, Heat Intolerance and New Diabetes. Hematology Not Present- Easy Bruising, Excessive bleeding, Gland problems, HIV and Persistent Infections.  Vitals [name]([name] CMA; 07/16/2014 9:31 AM) 07/16/2014 9:28 AM Weight: 238.5 lb Height: 74in Body Surface Area: 2.38 m Body Mass Index:  30.62 kg/m Temp.: 98.54F  BP: 140/60 (Sitting, Left Arm, Standard)     Physical Exam Roberto Lewis([name] MD; 07/16/2014 10:29 AM)  General Mental Status-Alert. General Appearance-Cooperative.  Chest and Lung Exam Auscultation Breath sounds - Normal.  Cardiovascular Auscultation Heart Sounds - Normal heart sounds.  Abdomen Palpation/Percussion Palpation  and Percussion of the abdomen reveal - Soft and Non Tender.  Rectal Anorectal Exam External - Note: small area of possible external opening located in the L anterior position. Internal - Note: possible anterior midline internal opening noted.    Assessment & Plan Roberto Lewis([name] MD; 07/16/2014 10:25 AM)  ANAL PAIN [phone](569.42  K62.89) Story: This is a 33 yo M with recurrent perianal abscesses. Impression: On exam there is concern for a perianal fistula. After discussing risks, benefits and alternatives I have recommended an anal EUA with possible fistulotomy vs seton placement.  Current Plans Pt Education - CCS Abscess/Fistula (AT)

## 2014-08-22 ENCOUNTER — Encounter: Payer: Self-pay | Admitting: Family Medicine

## 2014-08-22 ENCOUNTER — Ambulatory Visit (INDEPENDENT_AMBULATORY_CARE_PROVIDER_SITE_OTHER): Payer: 59 | Admitting: Family Medicine

## 2014-08-22 ENCOUNTER — Ambulatory Visit: Payer: 59 | Admitting: Family Medicine

## 2014-08-22 VITALS — BP 132/82 | HR 88 | Temp 98.7°F | Ht 74.0 in | Wt 238.0 lb

## 2014-08-22 DIAGNOSIS — B349 Viral infection, unspecified: Secondary | ICD-10-CM

## 2014-08-22 DIAGNOSIS — M545 Low back pain: Secondary | ICD-10-CM

## 2014-08-22 DIAGNOSIS — J029 Acute pharyngitis, unspecified: Secondary | ICD-10-CM

## 2014-08-22 DIAGNOSIS — J069 Acute upper respiratory infection, unspecified: Secondary | ICD-10-CM

## 2014-08-22 LAB — POCT URINALYSIS DIPSTICK
Bilirubin, UA: NEGATIVE
Blood, UA: NEGATIVE
Glucose, UA: NEGATIVE
Ketones, UA: NEGATIVE
LEUKOCYTES UA: NEGATIVE
Nitrite, UA: NEGATIVE
PH UA: 6
PROTEIN UA: NEGATIVE
Spec Grav, UA: >=1.03
UROBILINOGEN UA: NEGATIVE

## 2014-08-22 LAB — POCT RAPID STREP A (OFFICE): RAPID STREP A SCREEN: NEGATIVE

## 2014-08-22 NOTE — Progress Notes (Signed)
Chief Complaint  Patient presents with  . Sore Throat    and body aches. Feels like he "swallowed a roll of sandpaper." No energy. All started about three days ago. Also LBP since last week.    3 days ago he started with sore throat, the next day it was worse.  He also developed cough.  Today has had some runny nose.  He has had body aches x 2 days, and fatigue. He felt much worse today, so called for appointment.  He coughed up some discolored mucus, which choked him, and when he spit up he noticed some blood (in the phlegm he coughed up). He blew his nose this morning in the shower, and noticed blood from the right nostril.  +PND. Nasal mucus hasn't been discolored, mostly clear (except for the blood this morning).  Denies any shortness of breath.  He started having lower back pain (across entire lower back) last week.  Denies any lifting, twisting, change in activity or known injury. Denies any urinary complaints.  He took tylenol last night and this morning, and chloraseptic throat spray last night and this morning; helps some.  No known sick contacts.  PMH, PSH, SH reviewed.  Outpatient Encounter Prescriptions as of 08/22/2014  Medication Sig  . acetaminophen (TYLENOL) 325 MG tablet Take 650 mg by mouth every 6 (six) hours as needed.  . ARIPiprazole (ABILIFY) 10 MG tablet Take 10 mg by mouth daily.    . divalproex (DEPAKOTE) 250 MG 24 hr tablet Take 250 mg by mouth daily.    [name]. lisinopril-hydrochlorothiazide (PRINZIDE,ZESTORETIC) 10-12.5 MG per tablet Take one tablet by mouth one time daily  . [DISCONTINUED] sulfamethoxazole-trimethoprim (BACTRIM DS) 800-160 MG per tablet Take 1 tablet by mouth 2 (two) times daily.   Allergies  Allergen Reactions  . Effexor [Venlafaxine Hydrochloride] Other (See Comments)    Throat swelling, blurred vision    ROS:  No known fever, chills.  He had some sweats, not sure if more than normal with activity.  No vomiting, diarrhea. Some nausea from PND,  feeling like he wants to gag. Denies urinary complaints.   Denies bruising, rashes, or other abnormal bleeding (just from nose).  PHYSICAL EXAM: BP 132/82 mmHg  Pulse 88  Temp(Src) 98.7 F (37.1 C) (Tympanic)  Ht 6\' 2"  (1.88 m)  Wt 238 lb (107.956 kg)  BMI 30.54 kg/m2  Well developed, pleasant male, well-appearing, in no distress.  Frequent throat clearing HEENT: PERRL, EOMI, conjunctiva clear.  TM's and EAC's normal. Nasal mucosa is normal.  Sinuses are nontender.  OP is notable for erythema posteriorly.  No ulcers, lesions.  Tonsils are normal, no exudates Neck: no lymphadenopathy or mass Heart: regular rate and rhythm without murmur Lungs: clear bilaterally without wheezes, rales, ronchi. Skin: no rash Psych: normal mood, affect, hygiene and grooming Neuro: alert and oriented.  Cranial nerves intact. Normal gait.  Rapid strep negative   ASSESSMENT/PLAN:  Viral syndrome  Sore throat - Plan: Rapid Strep A  Low back pain, unspecified back pain laterality, with sciatica presence unspecified - Plan: POCT Urinalysis Dipstick  Acute upper respiratory infection   Viral syndrome/URI.  No evidence of bacterial infection. Supportive measures   Drink plenty of fluids. Continue tylenol and/or ibuprofen (or aleve) as needed for pain (sore throat, body aches). Consider using Mucinex or Robitussin (guaifenein--expectorant to keep the phlegm thin). You can use the plain or the DM version (dextromethorphan--cough suppressant) versus getting the plain kind, and using a separate delsym syrup as needed  for cough. Continue with salt water gargles and/or chloraseptic for throat pain.  Return if you develop fever, rash, worsening cough, shortness of breath, or other complaints. Return if ongoing bloody phlegm (without any blood noted from the nose)

## 2014-08-22 NOTE — Patient Instructions (Signed)
  Drink plenty of fluids. Continue tylenol and/or ibuprofen (or aleve) as needed for pain (sore throat, body aches). Consider using Mucinex or Robitussin (guaifenein--expectorant to keep the phlegm thin). You can use the plain or the DM version (dextromethorphan--cough suppressant) versus getting the plain kind, and using a separate delsym syrup as needed for cough. Continue with salt water gargles and/or chloraseptic for throat pain.  Return if you develop fever, rash, worsening cough, shortness of breath, or other complaints. Return if ongoing bloody phlegm (without any blood noted from the nose)

## 2014-08-23 ENCOUNTER — Telehealth: Payer: Self-pay | Admitting: Internal Medicine

## 2014-08-23 MED ORDER — AMOXICILLIN 875 MG PO TABS: 875.0000 mg | ORAL_TABLET | Freq: Two times a day (BID) | ORAL | Status: DC

## 2014-08-23 NOTE — Telephone Encounter (Signed)
Sent in amox. Advise pt. Continue to use tylenol/advil as needed

## 2014-08-23 NOTE — Telephone Encounter (Signed)
Pt notified of dr. Knapp recommendations  

## 2014-08-23 NOTE — Telephone Encounter (Signed)
Pt called this morning stating that he is feeling worse than when he saw you. He has been taking tynelol and Advil and having no relief and his fever is at 99.8. He is having Headaches and feeling dizzy and feels like he is in a daze. He wants to know if you would call in something for him to [facility] [address]

## 2014-08-30 ENCOUNTER — Telehealth: Payer: Self-pay | Admitting: Internal Medicine

## 2014-08-30 NOTE — Telephone Encounter (Signed)
He was given amox on 12/3, for a 10 day course.  He should complete the course of antibiotic and call Monday for refill if he isn't better by then

## 2014-08-30 NOTE — Telephone Encounter (Signed)
Patient advised.

## 2014-08-30 NOTE — Telephone Encounter (Signed)
Pt states that the antibotic has helped some but he is still coughing and having some dizziness. Pt wants to know if you can send in another antibiotic to [facility] [address]

## 2014-12-03 ENCOUNTER — Other Ambulatory Visit: Payer: Self-pay | Admitting: Family Medicine

## 2015-02-22 ENCOUNTER — Encounter: Payer: Self-pay | Admitting: Medical

## 2015-02-22 ENCOUNTER — Ambulatory Visit (INDEPENDENT_AMBULATORY_CARE_PROVIDER_SITE_OTHER): Payer: Commercial Indemnity | Admitting: Medical

## 2015-02-22 ENCOUNTER — Other Ambulatory Visit: Payer: Self-pay

## 2015-02-22 VITALS — BP 120/80 | HR 78 | Temp 97.9°F | Resp 16 | Wt 232.0 lb

## 2015-02-22 DIAGNOSIS — H9203 Otalgia, bilateral: Secondary | ICD-10-CM

## 2015-02-22 DIAGNOSIS — Z111 Encounter for screening for respiratory tuberculosis: Secondary | ICD-10-CM

## 2015-02-22 DIAGNOSIS — H6123 Impacted cerumen, bilateral: Secondary | ICD-10-CM

## 2015-02-22 DIAGNOSIS — H65192 Other acute nonsuppurative otitis media, left ear: Secondary | ICD-10-CM

## 2015-02-22 MED ORDER — AMOXICILLIN 875 MG PO TABS: 875.0000 mg | ORAL_TABLET | Freq: Two times a day (BID) | ORAL | Status: DC

## 2015-02-22 MED ORDER — TUBERCULIN PPD 5 UNIT/0.1ML ID SOLN
5.0000 [IU] | Freq: Once | INTRADERMAL | Status: AC
  Administered 2015-02-22: 5 [IU] via INTRADERMAL

## 2015-02-22 NOTE — Addendum Note (Signed)
Addended by: Lilli LightLOMAX, Denia Mcvicar G on: 02/22/2015 12:44 PM   Modules accepted: Orders

## 2015-02-22 NOTE — Progress Notes (Signed)
Subjective:   Roberto BowWilliam Belt Jr. is a 34 y.o. male who presents with ear pain, ear pressure and sound of water in left ear.  Has hx/o ear infections, hx/o impacted cerumen.    He has felt some congestion.  Ear pressure x 1 week+.  Denies fever, cough, sore throat, wheezing, rash.  No sick contacts.   Using nothing for symptoms.  No other aggravating or relieving factors.    Starting new job at the [employer], needs PPD placed, no prior PPD +.    No other c/o.  ROS as in subjective  Objective:  Filed Vitals:   02/22/15 1036  BP: 120/80  Pulse: 78  Temp: 97.9 F (36.6 C)  Resp: 16    General appearance: Alert, WD/WN, no distress                             Skin: warm, no rash                           Head: no sinus tenderness                            Eyes: conjunctiva normal, corneas clear, PERRLA                            Ears: bilat ear canals with cerumen impacted                          Nose: septum midline, turbinates swollen, with erythema and clear discharge             Mouth/throat: MMM, tongue normal, mild pharyngeal erythema                           Neck: supple, no adenopathy, no thyromegaly, nontender                         Lungs: CTA bilaterally, no wheezes, rales, or rhonchi     Assessment: Encounter Diagnoses  Name Primary?  . Ear pain, bilateral Yes  . Impacted cerumen of both ears   . Acute nonsuppurative otitis media of left ear   . Screening for tuberculosis     Plan: discussed PPD placement, return Monday for reading as discussed  Impacted cerumen - Discussed findings.  Discussed risk/benefits of procedure and patient agrees to procedure. Successfully used warm water lavage to remove impacted cerumen from bilat ear canal. Patient tolerated procedure well. Advised they avoid using any cotton swabs or other devices to clean the ear canals.  Use basic hygiene as discussed.  Follow up prn.   Begin amoxicillin, rest, hydrate well, discussed diagnosis and  treatment of otitis media.  Suggested symptomatic OTC remedies.  Tylenol or Ibuprofen OTC for fever and malaise.  Call/return in 2-3 days if symptoms aren't resolving.   F/u Monday for PPD reading

## 2015-03-06 ENCOUNTER — Other Ambulatory Visit: Payer: Self-pay | Admitting: Family Medicine

## 2015-04-04 ENCOUNTER — Encounter: Payer: Self-pay | Admitting: Family Medicine

## 2015-04-04 ENCOUNTER — Ambulatory Visit (INDEPENDENT_AMBULATORY_CARE_PROVIDER_SITE_OTHER): Payer: BLUE CROSS/BLUE SHIELD | Admitting: Family Medicine

## 2015-04-04 VITALS — BP 120/88 | HR 88 | Wt 236.0 lb

## 2015-04-04 DIAGNOSIS — K610 Anal abscess: Secondary | ICD-10-CM | POA: Diagnosis not present

## 2015-04-04 MED ORDER — AMOXICILLIN-POT CLAVULANATE 875-125 MG PO TABS: 1.0000 | ORAL_TABLET | Freq: Two times a day (BID) | ORAL | Status: DC

## 2015-04-04 NOTE — Progress Notes (Signed)
   Subjective:    Patient ID: Roberto BowWilliam Lanes Jr., male    DOB: 1981-05-10, 34 y.o.   MRN: [id number]  HPI He is here for evaluation of a 3 day history of rectal discomfort. He has a previous history of perirectal abscesses requiring surgical intervention. Review his record indicates that more extensive surgical intervention was recommended however he has not done that yet. He states the pain is getting worse over the last several days but no fever, chills.   Review of Systems     Objective:   Physical Exam Anal area shows no erythema or obvious induration. He does have tenderness to palpation at the 12:00 position. Rectal exam does show tenderness digitally in that area. No mass was appreciated.       Assessment & Plan:  Perianal abscess - Plan: amoxicillin-clavulanate (AUGMENTIN) 875-125 MG per tablet, Ambulatory referral to General Surgery Again discussed the need for him to do more extensive work concerning this issue. He will be scheduled to see surgery tomorrow.

## 2015-04-08 ENCOUNTER — Other Ambulatory Visit: Payer: Self-pay | Admitting: General Surgery

## 2015-04-08 NOTE — H&P (Signed)
Glenice BowWilliam Jaime Jr 04/08/2015 10:09 AM Location: [facility] Patient #: [id number] DOB: [birth year] Married / Language: [place] / Race: White Male History of Present Illness [name](Anju Sereno MD; 04/08/2015 10:28 AM) Patient words: anal fistula.  The patient is a 34 year old male who presents with anal pain. This is a 34 year old male who presents to the office again after last seeing him several months ago.. Over the last 2-3 years he has had 6-7 episodes of anal rectal abscess located in the same position. these have been drained in the office. He also occasionally has episodes of less severe swelling and pain that rupture at home with purulent drainage. He denies any regular anal rectal drainage. After seeing him last time, I recommended anal exam under anesthesia. It sounds like he did not follow through with this and is continuing to have the same symptoms.  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 Depakote ER (250MG  Tablet ER 24HR, Oral daily) Active. Abilify (10MG  Tablet, Oral daily) Active. Lisinopril-Hydrochlorothiazide (10-12.5MG  Tablet, Oral daily) Active. Medications Reconciled  Social History [name](Ethyle Tiedt, MD; 04/08/2015 10:28 AM) Alcohol use Remotely quit alcohol use. Tobacco use Former smoker. Illicit drug use Remotely quit drug use. Caffeine use Carbonated beverages, Coffee, Tea.  Family History [name](Shandelle Borrelli, MD;  04/08/2015 10:28 AM) Hypertension Father, Mother. Diabetes Mellitus Family Members In General. Bleeding disorder Father.    Vitals Tresa Endo(Kelly Dockery LPN; 1/74/08147/18/2016 48:1810:12 AM) 04/08/2015 10:11 AM Weight: 235.8 lb Height: 73in Body Surface Area: 2.35 m Body Mass Index: 31.11 kg/m Temp.: 97.75F(Oral)  Pulse: 95 (Regular)  BP: 122/78 (Sitting, Left Arm, Standard)     Physical Exam [name](Kynsley Whitehouse MD; 04/08/2015 10:28 AM)  General Mental Status-Alert. General Appearance-Consistent with stated age. Hydration-Well hydrated. Voice-Normal.  Head and Neck Head-normocephalic, atraumatic with no lesions or palpable masses. Trachea-midline. Thyroid Gland Characteristics - normal size and consistency.  Eye Eyeball - Bilateral-Extraocular movements intact. Sclera/Conjunctiva - Bilateral-No scleral icterus.  Chest and Lung Exam Chest and lung exam reveals -quiet, even and easy respiratory effort with no use of accessory muscles and on auscultation, normal breath sounds, no adventitious sounds and normal vocal resonance. Inspection Chest Wall - Normal. Back - normal.  Cardiovascular Cardiovascular examination reveals -normal heart sounds, regular rate and rhythm with no murmurs and normal pedal pulses bilaterally.  Abdomen Inspection Inspection of the abdomen reveals - No Hernias. Skin - Scar - no surgical scars. Palpation/Percussion Palpation and Percussion of the abdomen reveal - Soft, Non Tender, No Rebound tenderness, No Rigidity (guarding) and No hepatosplenomegaly. Auscultation Auscultation of the abdomen reveals - Bowel sounds normal.  Rectal Note: There is a palpable cord heading towards the rectum and the left anterior perirectal space. There is no redness or fluctuance. There is only mild tenderness associated with this. There is no drainage.   Neurologic Neurologic evaluation reveals -alert and oriented x 3 with no impairment of  recent or remote memory. Mental Status-Normal.  Musculoskeletal Normal Exam - Left-Upper Extremity Strength Normal and Lower Extremity Strength Normal. Normal Exam - Right-Upper Extremity Strength Normal  and Lower Extremity Strength Normal.    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 I have again recommended exam under anesthesia with fistulotomy versus seton placement. On exam he appears to have a palpable cord noted at his previous abscess I&D site. We discussed the risk of the procedure which are mainly bleeding, pain and infection. If the fistulotomy is performed, there is a small risk of incontinence. I believe he understands these risks and has agreed to proceed with the surgery.

## 2015-05-07 ENCOUNTER — Ambulatory Visit (INDEPENDENT_AMBULATORY_CARE_PROVIDER_SITE_OTHER): Payer: BLUE CROSS/BLUE SHIELD | Admitting: Family Medicine

## 2015-05-07 ENCOUNTER — Encounter: Payer: Self-pay | Admitting: Family Medicine

## 2015-05-07 VITALS — BP 118/78 | HR 97 | Wt 241.0 lb

## 2015-05-07 DIAGNOSIS — K610 Anal abscess: Secondary | ICD-10-CM

## 2015-05-07 DIAGNOSIS — L03314 Cellulitis of groin: Secondary | ICD-10-CM | POA: Diagnosis not present

## 2015-05-07 MED ORDER — DOXYCYCLINE HYCLATE 100 MG PO TABS: 100.0000 mg | ORAL_TABLET | Freq: Two times a day (BID) | ORAL | Status: DC

## 2015-05-07 NOTE — Progress Notes (Signed)
   Subjective:    Patient ID: Roberto Lewis., male    DOB: April 03, 1981, 34 y.o.   MRN: [id number]  HPI He has had difficulty with a perianal abscess was seen by general surgery apparently on 2 occasions. He is supposed to be scheduled for residual but they have not called him concerning this. He continues to have drainage from that area. He is now noting some erythema and pruritus in the groin area. He also notes this on the scrotum.   Review of Systems     Objective:   Physical Exam Patchy erythema is noted on the groin area including the testes as well as inner thighs. Exam of the anal area does show a small perianal incision.       Assessment & Plan:  Perianal abscess  Cellulitis of groin - Plan: doxycycline (VIBRA-TABS) 100 MG tablet I will treat him as if he has synovitis and also recommend he use a triple anabiotic on the skin but not scrub the area to aggressively. Also encouraged him to call surgery again to see if he can schedule the procedure. If he has difficulty he will call us. He apparently has tried twice to call but has not gotten a return phone call.

## 2015-05-07 NOTE — Patient Instructions (Signed)
Use an antibiotic ointment several times per day.

## 2015-05-09 ENCOUNTER — Telehealth: Payer: Self-pay | Admitting: Family Medicine

## 2015-05-09 NOTE — Telephone Encounter (Signed)
Pt asked if you would give him a call regarding his appointment this week, he wouldn't elaborate

## 2015-05-09 NOTE — Telephone Encounter (Signed)
Appointment with dermatology hopefully tomorrow

## 2015-06-02 ENCOUNTER — Other Ambulatory Visit: Payer: Self-pay | Admitting: Family Medicine

## 2015-07-18 ENCOUNTER — Ambulatory Visit (INDEPENDENT_AMBULATORY_CARE_PROVIDER_SITE_OTHER): Payer: BLUE CROSS/BLUE SHIELD | Admitting: Medical

## 2015-07-18 ENCOUNTER — Telehealth: Payer: Self-pay | Admitting: Medical

## 2015-07-18 ENCOUNTER — Other Ambulatory Visit: Payer: Self-pay | Admitting: Medical

## 2015-07-18 ENCOUNTER — Encounter: Payer: Self-pay | Admitting: Medical

## 2015-07-18 ENCOUNTER — Ambulatory Visit: Payer: BLUE CROSS/BLUE SHIELD | Admitting: Family Medicine

## 2015-07-18 VITALS — BP 120/74 | HR 104 | Temp 97.9°F | Ht 74.0 in | Wt 246.0 lb

## 2015-07-18 DIAGNOSIS — I10 Essential (primary) hypertension: Secondary | ICD-10-CM | POA: Diagnosis not present

## 2015-07-18 DIAGNOSIS — R5382 Chronic fatigue, unspecified: Secondary | ICD-10-CM

## 2015-07-18 LAB — COMPREHENSIVE METABOLIC PANEL
ALBUMIN: 4.5 g/dL (ref 3.6–5.1)
ALT: 87 U/L — ABNORMAL HIGH (ref 9–46)
AST: 64 U/L — AB (ref 10–40)
Alkaline Phosphatase: 80 U/L (ref 40–115)
BILIRUBIN TOTAL: 0.6 mg/dL (ref 0.2–1.2)
BUN: 17 mg/dL (ref 7–25)
CALCIUM: 9.5 mg/dL (ref 8.6–10.3)
CHLORIDE: 99 mmol/L (ref 98–110)
CO2: 28 mmol/L (ref 20–31)
Creat: 1.06 mg/dL (ref 0.60–1.35)
Glucose, Bld: 85 mg/dL (ref 65–99)
POTASSIUM: 4.5 mmol/L (ref 3.5–5.3)
Sodium: 139 mmol/L (ref 135–146)
Total Protein: 7.3 g/dL (ref 6.1–8.1)

## 2015-07-18 LAB — CBC WITH DIFFERENTIAL/PLATELET
BASOS ABS: 0 10*3/uL (ref 0.0–0.1)
Basophils Relative: 0 % (ref 0–1)
EOS ABS: 0.2 10*3/uL (ref 0.0–0.7)
EOS PCT: 2 % (ref 0–5)
HCT: 45.9 % (ref 39.0–52.0)
Hemoglobin: 15.8 g/dL (ref 13.0–17.0)
Lymphocytes Relative: 22 % (ref 12–46)
Lymphs Abs: 2.7 10*3/uL (ref 0.7–4.0)
MCH: 31.4 pg (ref 26.0–34.0)
MCHC: 34.4 g/dL (ref 30.0–36.0)
MCV: 91.3 fL (ref 78.0–100.0)
MPV: 9.8 fL (ref 8.6–12.4)
Monocytes Absolute: 1.2 10*3/uL — ABNORMAL HIGH (ref 0.1–1.0)
Monocytes Relative: 10 % (ref 3–12)
NEUTROS PCT: 66 % (ref 43–77)
Neutro Abs: 8.1 10*3/uL — ABNORMAL HIGH (ref 1.7–7.7)
Platelets: 305 10*3/uL (ref 150–400)
RBC: 5.03 MIL/uL (ref 4.22–5.81)
RDW: 12.9 % (ref 11.5–15.5)
WBC: 12.2 10*3/uL — AB (ref 4.0–10.5)

## 2015-07-18 LAB — HEMOGLOBIN A1C
Hgb A1c MFr Bld: 5.7 % — ABNORMAL HIGH (ref <5.7)
MEAN PLASMA GLUCOSE: 117 mg/dL — AB (ref <117)

## 2015-07-18 NOTE — Telephone Encounter (Signed)
[facility].  He was supposed to have surgical repair of his fistula, but he notes despite calling surgery, he keeps getting told they will call him back to schedule, but this has yet to happen.  Please let them know so they can contact him

## 2015-07-18 NOTE — Progress Notes (Signed)
Subjective: Chief Complaint  Patient presents with  . stomach issues    6-8 months he has been extremly tired with no energy, gets min 60f 8 hours of sleep every night. having to drink energy drinks on top of his coffee some mornings to be able to just stay awake driving to work. does a lot of driving at work and has to pull over to keep from falling asleep, says he takes naps on his lunch break. the past week he has diarrhea. nausea to the point he dry heeves/gagging. slight stomach pain. tried vitamins, broad range no help.   Here for c/o fatigue.   He notes for the past 6- 8 months just feels very tired, very fatigue all the time.  Gets plenty of sleep, eats regularly, but can't understand the fatigue.  He does note having 4 hours commute daily.  Lives in [place], drives to and from [place] daily which means a 4 hour commute.  While on the job is driving as well, short distances here and there.   He is having to drink energy drinks in the day to get by.    Denies hx/o anemia, thyroid disease, or heart problems.   Sex drive and sexual function is fine.  He does get short of breat at times, out of breath with activity at times. He does snore, there has been some witnessed apnea, does feel fatigued in the morning and gets daytime somnolence.   Has hx/o depression, but been on the same psych medications for 6-8 years ,sees [facility] psychiatry.   He is also a former smoker, smoked 1.5-2 ppd x 20 years.     He still has the fistula we saw him for months ago, and can't seem to get surgery center to call him back.    Past Medical History  Diagnosis Date  . Migraine   . Anxiety     (f/b by psych)  . Smoker     former  . Hypertension    Family History  Problem Relation Age of Onset  . COPD Father   . Heart disease Sister     congenital   Review of Systems Constitutional: -fever, -chills, -sweats, -unexpected weight change ENT: -runny nose, -ear pain, -sore throat Cardiology:  -chest pain,  -palpitations, -edema Respiratory: -cough, +shortness of breath, -wheezing Gastroenterology: -abdominal pain, -nausea, -vomiting, -diarrhea, -constipation  Hematology: -bleeding or bruising problems Musculoskeletal: -arthralgias, -myalgias, -joint swelling, -back pain Ophthalmology: -vision changes Urology: -dysuria, -difficulty urinating, -hematuria, -urinary frequency, -urgency Neurology: -headache, -weakness, -tingling, -numbness    Objective: BP 120/74 mmHg  Pulse 104  Temp(Src) 97.9 F (36.6 C) (Oral)  Ht  (1.88 m)  Wt 246 lb (111.585 kg)  BMI 31.57 kg/m2  General appearance: alert, no distress, WD/WN, obese white male HEENT: normocephalic, sclerae anicteric, TMs pearly, nares patent, no discharge or erythema, pharynx normal Oral cavity: MMM, no lesions Neck: supple, no lymphadenopathy, no thyromegaly, no masses Heart: RRR, normal S1, S2, no murmurs Lungs: CTA bilaterally, no wheezes, rhonchi, or rales Abdomen: +bs, soft, non tender, non distended, no masses, no hepatomegaly, no splenomegaly Pulses: 2+ symmetric, upper and lower extremities, normal cap refill   Adult ECG Report  Indication: fatigue  Rate: 78 bpm  Rhythm: normal sinus rhythm  QRS Axis: 23 degrees  PR Interval:  QRS Duration: 88ms  QTc:  Conduction Disturbances: none  Other Abnormalities: none  Patient's cardiac risk factors are: hypertension, obesity (BMI >= 30 kg/m2) and smoking/ tobacco exposure.  EKG comparison: none  Narrative Interpretation: normal EKG     Assessment: Encounter Diagnoses  Name Primary?  . Chronic fatigue Yes  . Essential hypertension     Plan: Discussed wide differential for fatigue.   He likely has sleep apnea, but need to rule out other causes.   Consider PFTs if all other testing is normal.   Consider sleep study.   F/u pending labs.   Roberto Lewis was seen today for stomach issues.  Diagnoses and all orders for this visit:  Chronic fatigue -      Comprehensive metabolic panel -     TSH -     CBC with Differential/Platelet -     Hemoglobin A1c -     EKG 12-Lead -     PR ELECTROCARDIOGRAM, COMPLETE -     POCT urinalysis dipstick  Essential hypertension -     Comprehensive metabolic panel -     TSH -     CBC with Differential/Platelet -     Hemoglobin A1c -     EKG 12-Lead -     PR ELECTROCARDIOGRAM, COMPLETE -     POCT urinalysis dipstick

## 2015-07-19 ENCOUNTER — Other Ambulatory Visit: Payer: Self-pay | Admitting: Medical

## 2015-07-19 DIAGNOSIS — R945 Abnormal results of liver function studies: Principal | ICD-10-CM

## 2015-07-19 DIAGNOSIS — R7989 Other specified abnormal findings of blood chemistry: Secondary | ICD-10-CM

## 2015-07-19 LAB — TSH: TSH: 1.05 u[IU]/mL (ref 0.350–4.500)

## 2015-07-19 LAB — FERRITIN: Ferritin: 345 ng/mL — ABNORMAL HIGH (ref 22–322)

## 2015-07-19 LAB — AFP TUMOR MARKER: AFP TUMOR MARKER: 3.4 ng/mL (ref <6.1)

## 2015-07-19 NOTE — Telephone Encounter (Signed)
Has a balance with them that has to be paid in order for him to be seen. Pt is aware of this and said he will pay it to set up his surgery.

## 2015-07-20 LAB — HEPATITIS PANEL, ACUTE
HCV AB: NEGATIVE
HEP B C IGM: NONREACTIVE
Hep A IgM: NONREACTIVE
Hepatitis B Surface Ag: NEGATIVE

## 2015-07-24 ENCOUNTER — Telehealth: Payer: Self-pay | Admitting: Medical

## 2015-07-24 NOTE — Telephone Encounter (Signed)
Pt asked if you would call him regarding tomorrow's appt,  Tried to ask what about and he states he needed to talk to you or Vincenza HewsShane

## 2015-07-24 NOTE — Telephone Encounter (Signed)
Spoke with pt he wanted to know what to expect and what to bring, I informed him he needed to bring nothing and we just need to further our testing to narrow down the results we already have.

## 2015-07-24 NOTE — Telephone Encounter (Signed)
LMTCB

## 2015-07-25 ENCOUNTER — Encounter: Payer: Self-pay | Admitting: Medical

## 2015-07-25 ENCOUNTER — Other Ambulatory Visit: Payer: Self-pay | Admitting: Medical

## 2015-07-25 ENCOUNTER — Ambulatory Visit (INDEPENDENT_AMBULATORY_CARE_PROVIDER_SITE_OTHER): Payer: BLUE CROSS/BLUE SHIELD | Admitting: Medical

## 2015-07-25 VITALS — BP 130/80 | HR 87 | Wt 244.0 lb

## 2015-07-25 DIAGNOSIS — Z87891 Personal history of nicotine dependence: Secondary | ICD-10-CM | POA: Diagnosis not present

## 2015-07-25 DIAGNOSIS — G4710 Hypersomnia, unspecified: Secondary | ICD-10-CM

## 2015-07-25 DIAGNOSIS — R0609 Other forms of dyspnea: Secondary | ICD-10-CM | POA: Diagnosis not present

## 2015-07-25 DIAGNOSIS — E669 Obesity, unspecified: Secondary | ICD-10-CM

## 2015-07-25 DIAGNOSIS — R6882 Decreased libido: Secondary | ICD-10-CM | POA: Diagnosis not present

## 2015-07-25 DIAGNOSIS — R7989 Other specified abnormal findings of blood chemistry: Secondary | ICD-10-CM

## 2015-07-25 DIAGNOSIS — R400 Somnolence: Secondary | ICD-10-CM

## 2015-07-25 DIAGNOSIS — R7301 Impaired fasting glucose: Secondary | ICD-10-CM | POA: Diagnosis not present

## 2015-07-25 DIAGNOSIS — R5383 Other fatigue: Secondary | ICD-10-CM | POA: Diagnosis not present

## 2015-07-25 DIAGNOSIS — R945 Abnormal results of liver function studies: Secondary | ICD-10-CM

## 2015-07-25 DIAGNOSIS — R0600 Dyspnea, unspecified: Secondary | ICD-10-CM | POA: Diagnosis not present

## 2015-07-25 LAB — POCT URINALYSIS DIPSTICK
Bilirubin, UA: NEGATIVE
Blood, UA: NEGATIVE
Glucose, UA: NEGATIVE
KETONES UA: NEGATIVE
Leukocytes, UA: NEGATIVE
Nitrite, UA: NEGATIVE
PH UA: 5.5
Protein, UA: NEGATIVE
Urobilinogen, UA: NEGATIVE

## 2015-07-25 LAB — TESTOSTERONE: Testosterone: 245 ng/dL — ABNORMAL LOW (ref 300–890)

## 2015-07-25 NOTE — Progress Notes (Signed)
Subjective: Chief Complaint  Patient presents with  . discuss labs    no questions at this time.    Here for f/u from last visit and labs.   Here for c/o fatigue.   He notes for the past 6- 8 months just feels very tired, very fatigue all the time.  Gets plenty of sleep, eats regularly, but can't understand the fatigue.  He does note having 4 hours commute daily.  Lives in [place], drives to and from [place] daily which means a 4 hour commute.  While on the job is driving as well, short distances here and there.   He is having to drink energy drinks in the day to get by.     Denies hx/o anemia, thyroid disease, or heart problems.   Sexual function is ok now but has been lacking at times including low libido.   He does get short of breat at times, out of breath with activity at times. He does snore, there has been some witnessed apnea, does feel fatigued in the morning and gets daytime somnolence.  He notes can get dizzy, SOB, and lightheaded with exercise.   Has hx/o depression, but been on the same psych medications for 6-8 years ,sees [facility] psychiatry.   He is also a former smoker, smoked 1.5-2 ppd x 20 years.      Past Medical History  Diagnosis Date  . Migraine   . Anxiety     (f/b by psych)  . Smoker     former  . Hypertension    Family History  Problem Relation Age of Onset  . COPD Father   . Heart disease Sister     congenital   Review of Systems Constitutional: -fever, -chills, -sweats, -unexpected weight change ENT: -runny nose, -ear pain, -sore throat Cardiology:  -chest pain, -palpitations, -edema Respiratory: -cough, +shortness of breath, -wheezing Gastroenterology: -abdominal pain, -nausea, -vomiting, -diarrhea, -constipation  Hematology: -bleeding or bruising problems Musculoskeletal: -arthralgias, -myalgias, -joint swelling, -back pain Ophthalmology: -vision changes Urology: -dysuria, -difficulty urinating, -hematuria, -urinary frequency, -urgency Neurology:  -headache, -weakness, -tingling, -numbness    Objective: BP 130/80 mmHg  Pulse 87  Wt 244 lb (110.678 kg)  General appearance: alert, no distress, WD/WN, obese white male Neck: supple, no lymphadenopathy, no thyromegaly, no masses Heart: RRR, normal S1, S2, no murmurs Lungs: CTA bilaterally, no wheezes, rhonchi, or rales Abdomen: +bs, soft, non tender, non distended, no masses, no hepatomegaly, no splenomegaly Pulses: 2+ symmetric, upper and lower extremities, normal cap refill Ext: no edema    Assessment: Encounter Diagnoses  Name Primary?  . Other fatigue Yes  . Daytime somnolence   . Low libido   . Former smoker   . Dyspnea on exertion   . Dyspnea   . Obesity   . Elevated LFTs   . Impaired fasting glucose     Plan: discussed the recent lab findings  Elevated LFTs - likely due to fatty liver given he doesn't drink, no regular Tylenol use, negative hepatitis panel and AFP, and he is obese with need for diet changes.   Advised diet and exercise changes, weight loss, plan to recheck LFTs in 2-4 mo  impaired fasting glucose - discussed significance, need for lifestyle changes  Obesity - advised lifestyle changes, weight loss  Fatigue, snoring, daytime somnolence - pending CXR, consider referral for sleep study.  He also has long commute, family responsibilities which also contribute to the fatigue.  PFTs today show restriction pattern, mild, with 34yo lung age.  EKG from last visit normal.  Will send for Chest xray.  discussed possibility of low TST.   Will check TST level today.  If TST low, will add Prolactin, LH/FSH, PSA.   Chrissie NoaWilliam was seen today for discuss labs.  Diagnoses and all orders for this visit:  Other fatigue -     Testosterone  Daytime somnolence  Low libido -     Testosterone  Former smoker  Dyspnea on exertion  Dyspnea  Obesity  Elevated LFTs  Impaired fasting glucose

## 2015-07-26 ENCOUNTER — Other Ambulatory Visit: Payer: Self-pay | Admitting: Medical

## 2015-07-26 ENCOUNTER — Ambulatory Visit
Admission: RE | Admit: 2015-07-26 | Discharge: 2015-07-26 | Disposition: A | Discharge status: Home / Self Care | Payer: BLUE CROSS/BLUE SHIELD | Source: Ambulatory Visit | Attending: Medical | Admitting: Medical

## 2015-07-26 DIAGNOSIS — R0609 Other forms of dyspnea: Secondary | ICD-10-CM

## 2015-07-26 DIAGNOSIS — R0600 Dyspnea, unspecified: Secondary | ICD-10-CM

## 2015-07-26 DIAGNOSIS — R7989 Other specified abnormal findings of blood chemistry: Secondary | ICD-10-CM

## 2015-07-26 DIAGNOSIS — Z87891 Personal history of nicotine dependence: Secondary | ICD-10-CM

## 2015-07-26 LAB — FSH/LH
FSH: 10.2 m[IU]/mL (ref 1.4–18.1)
LH: 9.5 m[IU]/mL — ABNORMAL HIGH (ref 1.5–9.3)

## 2015-07-26 LAB — PROLACTIN: Prolactin: 7.5 ng/mL (ref 2.1–17.1)

## 2015-07-27 LAB — PSA: PSA: 0.61 ng/mL (ref <=4.00)

## 2015-07-29 ENCOUNTER — Other Ambulatory Visit: Payer: Self-pay | Admitting: Medical

## 2015-07-29 DIAGNOSIS — R7989 Other specified abnormal findings of blood chemistry: Secondary | ICD-10-CM

## 2015-07-29 DIAGNOSIS — E291 Testicular hypofunction: Secondary | ICD-10-CM

## 2015-07-29 NOTE — Progress Notes (Signed)
Quick Note:  The additional labs show low testosterone/ male hormone. Chest xray was normal.   At this point we have a few things to consider. 1) he has some sleep apnea symptoms, and if he wants to evaluate this, we can set up for sleep study. If agreeable, set up home health [facility] sleep study 2) he has abnormality on the lung study we did in the office. I'd like to try him on an inhaler to reduce inflammation and open up lungs. I'll give you sample to have him try called Dulera, 2 long deep inhaled puffs twice daily. Rinse mouth out with water after each use.  3) lastly in addition to the things we discussed last visit, his testosterone is low, but for insurance to cover treatment, he would have to repeat the testosterone lab one more time to verify it is in fact low. He can come in at his convenience to do this if desired.   See if he has other questions at this time, and let me know about his decision on the things above. ______

## 2015-08-06 ENCOUNTER — Telehealth: Payer: Self-pay

## 2015-08-06 NOTE — Telephone Encounter (Signed)
Ok, that is odd, but for now hold off on the inhaler.  C/t plan for sleep study which I think is in process.

## 2015-08-06 NOTE — Telephone Encounter (Signed)
Pt called about his inhaler. He said he is having a side effect to it, he gets really bad headaches and when they come on he has to shut his eyes. He said the pain associated with his headaches were sharp and "brought him to his knees." He wants to know what you want to do about this.

## 2015-08-06 NOTE — Telephone Encounter (Signed)
Pt informed

## 2015-08-13 ENCOUNTER — Ambulatory Visit: Payer: BLUE CROSS/BLUE SHIELD | Admitting: Family Medicine

## 2015-09-07 ENCOUNTER — Encounter (HOSPITAL_COMMUNITY): Payer: Self-pay | Admitting: Oncology

## 2015-09-07 ENCOUNTER — Emergency Department (HOSPITAL_COMMUNITY): Payer: BLUE CROSS/BLUE SHIELD

## 2015-09-07 ENCOUNTER — Observation Stay (HOSPITAL_COMMUNITY)
Admission: EM | Admit: 2015-09-07 | Discharge: 2015-09-09 | Disposition: A | Discharge status: Home / Self Care | Payer: BLUE CROSS/BLUE SHIELD | Attending: General Surgery | Admitting: General Surgery

## 2015-09-07 DIAGNOSIS — F419 Anxiety disorder, unspecified: Secondary | ICD-10-CM | POA: Insufficient documentation

## 2015-09-07 DIAGNOSIS — K760 Fatty (change of) liver, not elsewhere classified: Secondary | ICD-10-CM | POA: Insufficient documentation

## 2015-09-07 DIAGNOSIS — K3580 Unspecified acute appendicitis: Principal | ICD-10-CM | POA: Insufficient documentation

## 2015-09-07 DIAGNOSIS — Z79899 Other long term (current) drug therapy: Secondary | ICD-10-CM | POA: Insufficient documentation

## 2015-09-07 DIAGNOSIS — J449 Chronic obstructive pulmonary disease, unspecified: Secondary | ICD-10-CM | POA: Insufficient documentation

## 2015-09-07 DIAGNOSIS — K37 Unspecified appendicitis: Secondary | ICD-10-CM | POA: Diagnosis present

## 2015-09-07 DIAGNOSIS — I10 Essential (primary) hypertension: Secondary | ICD-10-CM | POA: Insufficient documentation

## 2015-09-07 DIAGNOSIS — Z87891 Personal history of nicotine dependence: Secondary | ICD-10-CM | POA: Diagnosis not present

## 2015-09-07 DIAGNOSIS — R109 Unspecified abdominal pain: Secondary | ICD-10-CM | POA: Diagnosis present

## 2015-09-07 LAB — CBC
HCT: 44.8 % (ref 39.0–52.0)
Hemoglobin: 15.1 g/dL (ref 13.0–17.0)
MCH: 31.8 pg (ref 26.0–34.0)
MCHC: 33.7 g/dL (ref 30.0–36.0)
MCV: 94.3 fL (ref 78.0–100.0)
PLATELETS: 286 10*3/uL (ref 150–400)
RBC: 4.75 MIL/uL (ref 4.22–5.81)
RDW: 12.2 % (ref 11.5–15.5)
WBC: 18 10*3/uL — AB (ref 4.0–10.5)

## 2015-09-07 LAB — URINALYSIS, ROUTINE W REFLEX MICROSCOPIC
Bilirubin Urine: NEGATIVE
GLUCOSE, UA: NEGATIVE mg/dL
Hgb urine dipstick: NEGATIVE
Ketones, ur: NEGATIVE mg/dL
LEUKOCYTES UA: NEGATIVE
Nitrite: NEGATIVE
PROTEIN: NEGATIVE mg/dL
Specific Gravity, Urine: 1.025 (ref 1.005–1.030)
pH: 7 (ref 5.0–8.0)

## 2015-09-07 LAB — COMPREHENSIVE METABOLIC PANEL
ALT: 80 U/L — ABNORMAL HIGH (ref 17–63)
ANION GAP: 10 (ref 5–15)
AST: 55 U/L — ABNORMAL HIGH (ref 15–41)
Albumin: 4.5 g/dL (ref 3.5–5.0)
Alkaline Phosphatase: 77 U/L (ref 38–126)
BUN: 21 mg/dL — ABNORMAL HIGH (ref 6–20)
CHLORIDE: 100 mmol/L — AB (ref 101–111)
CO2: 29 mmol/L (ref 22–32)
CREATININE: 1.36 mg/dL — AB (ref 0.61–1.24)
Calcium: 9.2 mg/dL (ref 8.9–10.3)
Glucose, Bld: 108 mg/dL — ABNORMAL HIGH (ref 65–99)
POTASSIUM: 4.2 mmol/L (ref 3.5–5.1)
SODIUM: 139 mmol/L (ref 135–145)
Total Bilirubin: 0.5 mg/dL (ref 0.3–1.2)
Total Protein: 7.6 g/dL (ref 6.5–8.1)

## 2015-09-07 LAB — LIPASE, BLOOD: LIPASE: 47 U/L (ref 11–51)

## 2015-09-07 MED ORDER — PIPERACILLIN-TAZOBACTAM 3.375 G IVPB
3.3750 g | Freq: Once | INTRAVENOUS | Status: DC
  Filled 2015-09-07: qty 50

## 2015-09-07 MED ORDER — SODIUM CHLORIDE 0.9 % IV BOLUS (SEPSIS)
1000.0000 mL | Freq: Once | INTRAVENOUS | Status: AC
  Administered 2015-09-07: 1000 mL via INTRAVENOUS

## 2015-09-07 MED ORDER — IOHEXOL 300 MG/ML  SOLN
100.0000 mL | Freq: Once | INTRAMUSCULAR | Status: AC | PRN
  Administered 2015-09-07: 100 mL via INTRAVENOUS

## 2015-09-07 MED ORDER — ONDANSETRON HCL 4 MG/2ML IJ SOLN
4.0000 mg | Freq: Once | INTRAMUSCULAR | Status: AC
  Administered 2015-09-07: 4 mg via INTRAVENOUS
  Filled 2015-09-07: qty 2

## 2015-09-07 NOTE — ED Notes (Signed)
Pt presents d/t sharp epigastric pain.  Pt rates pain 10/10.  Took ppi at home w/o relief.

## 2015-09-07 NOTE — ED Notes (Signed)
Patient transported to CT 

## 2015-09-07 NOTE — ED Provider Notes (Signed)
CSN: [id number]     Arrival date & time 09/07/15  1956 History   First MD Initiated Contact with Patient 09/07/15 2009     Chief Complaint  Patient presents with  . Abdominal Pain     (Consider location/radiation/quality/duration/timing/severity/associated sxs/prior Treatment) HPI Comments: Epigastric pain started today around 4PM Cramping pain up in epigastric area, coming and going After eating was worsened Sharp/stabbing, constant pain Nausea No vomiting No diarrhea No urinary symptoms No fevers   Patient is a 34 y.o. male presenting with abdominal pain.  Abdominal Pain Pain location:  Epigastric Associated symptoms: nausea   Associated symptoms: no chest pain, no constipation, no diarrhea, no fever, no shortness of breath, no sore throat and no vomiting     Past Medical History  Diagnosis Date  . Migraine   . Anxiety     (f/b by psych)  . Smoker     former  . Hypertension    Past Surgical History  Procedure Laterality Date  . Incision and drainage abscess anal  04/10/2011    Dr Daphine Deutscher  . Incision and drainage abscess anal  May 2013    Dr Magnus Ivan  . Incision and drainage abscess anal  09/15/2012    Dr Biagio Quint   Family History  Problem Relation Age of Onset  . COPD Father   . Heart disease Sister     congenital   Social History  Substance Use Topics  . Smoking status: Former Smoker -- 1.00 packs/day    Types: Cigarettes    Quit date: 03/29/2011  . Smokeless tobacco: Never Used  . Alcohol Use: No    Review of Systems  Constitutional: Negative for fever.  HENT: Negative for sore throat.   Eyes: Negative for visual disturbance.  Respiratory: Negative for shortness of breath.   Cardiovascular: Negative for chest pain.  Gastrointestinal: Positive for nausea and abdominal pain. Negative for vomiting, diarrhea and constipation.  Genitourinary: Negative for difficulty urinating.  Musculoskeletal: Negative for back pain and neck stiffness.  Skin:  Negative for rash.  Neurological: Negative for syncope and headaches.      Allergies  Effexor  Home Medications   Prior to Admission medications   Medication Sig Start Date End Date Taking? Authorizing Provider  acetaminophen (TYLENOL) 500 MG tablet Take 1,000 mg by mouth every 6 (six) hours as needed for moderate pain or headache.   Yes Historical Provider, MD  ARIPiprazole (ABILIFY) 10 MG tablet Take 10 mg by mouth daily.     Yes Historical Provider, MD  Aspirin-Acetaminophen-Caffeine (GOODY HEADACHE PO) Take 1 packet by mouth daily as needed (headache).   Yes Historical Provider, MD  divalproex (DEPAKOTE) 250 MG 24 hr tablet Take 250 mg by mouth daily.     Yes Historical Provider, MD  Famotidine (PEPCID AC PO) Take 1 tablet by mouth daily as needed (acid reflux).   Yes Historical Provider, MD  lisinopril-hydrochlorothiazide (PRINZIDE,ZESTORETIC) 10-12.5 MG per tablet TAKE ONE TABLET BY MOUTH ONE TIME DAILY **NEW INS CARD?? 06/03/15  Yes Ronnald Nian, MD  doxycycline (VIBRA-TABS) 100 MG tablet Take 1 tablet (100 mg total) by mouth 2 (two) times daily. Patient not taking: Reported on 07/18/2015 05/07/15   Ronnald Nian, MD   BP 125/68 mmHg  Pulse 111  Temp(Src) 98.7 F (37.1 C) (Oral)  Resp 12  Ht  (1.88 m)  Wt 250 lb (113.399 kg)  BMI 32.08 kg/m2  SpO2 98% Physical Exam  Constitutional: He is oriented to person, place, and  time. He appears well-developed and well-nourished. No distress.  HENT:  Head: Normocephalic and atraumatic.  Eyes: Conjunctivae and EOM are normal.  Neck: Normal range of motion.  Cardiovascular: Normal rate, regular rhythm, normal heart sounds and intact distal pulses.  Exam reveals no gallop and no friction rub.   No murmur heard. Pulmonary/Chest: Effort normal and breath sounds normal. No respiratory distress. He has no wheezes. He has no rales.  Abdominal: Soft. He exhibits no distension. There is tenderness in the right lower quadrant. There is  tenderness at McBurney's point. There is no guarding and negative Murphy's sign.  Musculoskeletal: He exhibits no edema.  Neurological: He is alert and oriented to person, place, and time.  Skin: Skin is warm and dry. He is not diaphoretic.  Nursing note and vitals reviewed.   ED Course  Procedures (including critical care time) Labs Review Labs Reviewed  COMPREHENSIVE METABOLIC PANEL - Abnormal; Notable for the following:    Chloride 100 (*)    Glucose, Bld 108 (*)    BUN 21 (*)    Creatinine, Ser 1.36 (*)    AST 55 (*)    ALT 80 (*)    All other components within normal limits  CBC - Abnormal; Notable for the following:    WBC 18.0 (*)    All other components within normal limits  SURGICAL PCR SCREEN  LIPASE, BLOOD  URINALYSIS, ROUTINE W REFLEX MICROSCOPIC (NOT AT [facility])  SURGICAL PATHOLOGY    Imaging Review Ct Abdomen Pelvis W Contrast  09/07/2015  CLINICAL DATA:  Sharp right lower quadrant pain for 1 day with nausea. EXAM: CT ABDOMEN AND PELVIS WITH CONTRAST TECHNIQUE: Multidetector CT imaging of the abdomen and pelvis was performed using the standard protocol following bolus administration of intravenous contrast. CONTRAST:  100mL OMNIPAQUE IOHEXOL 300 MG/ML  SOLN COMPARISON:  None. FINDINGS: Walls of the appendix appear thickened throughout and there is considerable periappendiceal inflammation/fluid stranding indicating acute appendicitis. No appendicolith identified. No periappendiceal abscess collection seen. No free intraperitoneal air. Liver is diffusely low in density indicating fatty infiltration. Gallbladder, spleen, pancreas, and adrenal glands appear normal. Kidneys appear normal without stone or hydronephrosis. Abdominal aorta is normal in caliber. Stomach appears normal. Small bowel is normal in caliber and configuration. Large bowel is normal in caliber and configuration. Lung bases are clear. No osseous abnormality. IMPRESSION: 1. Findings are consistent with acute  appendicitis. Walls of the appendix appear thickened and there is considerable periappendiceal inflammation/fluid stranding. No periappendiceal abscess collection identified. No free intraperitoneal air. 2. Fatty infiltration of the liver. These results were called by telephone at the time of interpretation on 09/07/2015 at 11:21 pm to Dr. Alvira MondayERIN Jeromey Kruer , who verbally acknowledged these results. Electronically Signed   By: Bary RichardStan  Maynard M.D.   On: 09/07/2015 23:22   I have personally reviewed and evaluated these images and lab results as part of my medical decision-making.   EKG Interpretation None      MDM   Final diagnoses:  Acute appendicitis, unspecified acute appendicitis type   34yo male presents with abdominal pain and nausea. RLQ tenderness on exam and CT abd/pelvis ordered shows acute appendicitis. Given zosyn, General Surgery Dr. Carolynne Edouardoth, consulted and to admit patient. Pt hemodynamically stable. Declines pain medications.     Alvira MondayErin Tresia Revolorio, MD 09/08/15 1246

## 2015-09-07 NOTE — ED Notes (Signed)
Dr Toth at bedside. 

## 2015-09-08 ENCOUNTER — Encounter (HOSPITAL_COMMUNITY)
Admission: EM | Disposition: A | Discharge status: Home / Self Care | Payer: Self-pay | Source: Home / Self Care | Attending: Emergency Medicine

## 2015-09-08 ENCOUNTER — Observation Stay (HOSPITAL_COMMUNITY): Payer: BLUE CROSS/BLUE SHIELD | Admitting: Anesthesiology

## 2015-09-08 HISTORY — PX: LAPAROSCOPIC APPENDECTOMY: SHX408

## 2015-09-08 LAB — SURGICAL PCR SCREEN
MRSA, PCR: NEGATIVE
STAPHYLOCOCCUS AUREUS: NEGATIVE

## 2015-09-08 SURGERY — APPENDECTOMY, LAPAROSCOPIC
Anesthesia: General | Site: Abdomen

## 2015-09-08 MED ORDER — 0.9 % SODIUM CHLORIDE (POUR BTL) OPTIME
TOPICAL | Status: DC | PRN
  Administered 2015-09-08: 1000 mL

## 2015-09-08 MED ORDER — DEXAMETHASONE SODIUM PHOSPHATE 10 MG/ML IJ SOLN
INTRAMUSCULAR | Status: AC
  Filled 2015-09-08: qty 1

## 2015-09-08 MED ORDER — MIDAZOLAM HCL 5 MG/5ML IJ SOLN
INTRAMUSCULAR | Status: DC | PRN
  Administered 2015-09-08: 2 mg via INTRAVENOUS

## 2015-09-08 MED ORDER — MORPHINE SULFATE (PF) 2 MG/ML IV SOLN
1.0000 mg | INTRAVENOUS | Status: DC | PRN
  Administered 2015-09-08: 1 mg via INTRAVENOUS
  Administered 2015-09-08: 2 mg via INTRAVENOUS
  Filled 2015-09-08 (×2): qty 1

## 2015-09-08 MED ORDER — LIDOCAINE HCL (CARDIAC) 20 MG/ML IV SOLN
INTRAVENOUS | Status: AC
  Filled 2015-09-08: qty 5

## 2015-09-08 MED ORDER — BUPIVACAINE-EPINEPHRINE 0.25% -1:200000 IJ SOLN
INTRAMUSCULAR | Status: AC
  Filled 2015-09-08: qty 1

## 2015-09-08 MED ORDER — FENTANYL CITRATE (PF) 250 MCG/5ML IJ SOLN
INTRAMUSCULAR | Status: AC
  Filled 2015-09-08: qty 5

## 2015-09-08 MED ORDER — LACTATED RINGERS IR SOLN
Status: DC | PRN
  Administered 2015-09-08: 1000 mL

## 2015-09-08 MED ORDER — FENTANYL CITRATE (PF) 100 MCG/2ML IJ SOLN
INTRAMUSCULAR | Status: DC | PRN
  Administered 2015-09-08 (×2): 50 ug via INTRAVENOUS

## 2015-09-08 MED ORDER — PROPOFOL 10 MG/ML IV BOLUS
INTRAVENOUS | Status: AC
  Filled 2015-09-08: qty 20

## 2015-09-08 MED ORDER — KCL IN DEXTROSE-NACL 20-5-0.9 MEQ/L-%-% IV SOLN
INTRAVENOUS | Status: DC
  Administered 2015-09-08: 22:00:00 via INTRAVENOUS
  Administered 2015-09-08: 100 mL/h via INTRAVENOUS
  Administered 2015-09-08 – 2015-09-09 (×2): via INTRAVENOUS
  Filled 2015-09-08 (×4): qty 1000

## 2015-09-08 MED ORDER — PIPERACILLIN-TAZOBACTAM 3.375 G IVPB
3.3750 g | Freq: Three times a day (TID) | INTRAVENOUS | Status: DC
  Administered 2015-09-08 – 2015-09-09 (×4): 3.375 g via INTRAVENOUS
  Filled 2015-09-08 (×6): qty 50

## 2015-09-08 MED ORDER — SUCCINYLCHOLINE CHLORIDE 20 MG/ML IJ SOLN
INTRAMUSCULAR | Status: DC | PRN
  Administered 2015-09-08: 10 mg via INTRAVENOUS

## 2015-09-08 MED ORDER — SUGAMMADEX SODIUM 500 MG/5ML IV SOLN
INTRAVENOUS | Status: DC | PRN
Start: 2015-09-08 — End: 2015-09-08
  Administered 2015-09-08: 300 mg via INTRAVENOUS

## 2015-09-08 MED ORDER — HYDROMORPHONE HCL 1 MG/ML IJ SOLN
INTRAMUSCULAR | Status: DC
Start: 2015-09-08 — End: 2015-09-08
  Filled 2015-09-08: qty 1

## 2015-09-08 MED ORDER — PROMETHAZINE HCL 25 MG/ML IJ SOLN: 25.0000 mg | Freq: Three times a day (TID) | INTRAMUSCULAR | Status: DC | PRN

## 2015-09-08 MED ORDER — PROMETHAZINE HCL 25 MG/ML IJ SOLN: 6.2500 mg | INTRAMUSCULAR | Status: DC | PRN

## 2015-09-08 MED ORDER — KETOROLAC TROMETHAMINE 30 MG/ML IJ SOLN
30.0000 mg | Freq: Four times a day (QID) | INTRAMUSCULAR | Status: DC
  Administered 2015-09-08 – 2015-09-09 (×3): 30 mg via INTRAVENOUS
  Filled 2015-09-08 (×4): qty 1

## 2015-09-08 MED ORDER — PANTOPRAZOLE SODIUM 40 MG IV SOLR
40.0000 mg | Freq: Every day | INTRAVENOUS | Status: DC
  Administered 2015-09-08 (×2): 40 mg via INTRAVENOUS
  Filled 2015-09-08 (×3): qty 40

## 2015-09-08 MED ORDER — ONDANSETRON HCL 4 MG/2ML IJ SOLN
INTRAMUSCULAR | Status: AC
  Filled 2015-09-08: qty 2

## 2015-09-08 MED ORDER — ROCURONIUM BROMIDE 100 MG/10ML IV SOLN
INTRAVENOUS | Status: AC
  Filled 2015-09-08: qty 1

## 2015-09-08 MED ORDER — MIDAZOLAM HCL 2 MG/2ML IJ SOLN
INTRAMUSCULAR | Status: AC
  Filled 2015-09-08: qty 2

## 2015-09-08 MED ORDER — HYDROMORPHONE HCL 1 MG/ML IJ SOLN
INTRAMUSCULAR | Status: AC
  Filled 2015-09-08: qty 1

## 2015-09-08 MED ORDER — PROPOFOL 10 MG/ML IV BOLUS
INTRAVENOUS | Status: DC | PRN
  Administered 2015-09-08: 200 mg via INTRAVENOUS

## 2015-09-08 MED ORDER — OXYCODONE-ACETAMINOPHEN 5-325 MG PO TABS
1.0000 | ORAL_TABLET | ORAL | Status: DC | PRN
  Administered 2015-09-08: 2 via ORAL
  Filled 2015-09-08: qty 2

## 2015-09-08 MED ORDER — BUPIVACAINE-EPINEPHRINE 0.25% -1:200000 IJ SOLN
INTRAMUSCULAR | Status: DC | PRN
  Administered 2015-09-08: 3 mL

## 2015-09-08 MED ORDER — LISINOPRIL-HYDROCHLOROTHIAZIDE 10-12.5 MG PO TABS: 1.0000 | ORAL_TABLET | Freq: Every day | ORAL | Status: DC

## 2015-09-08 MED ORDER — ONDANSETRON 4 MG PO TBDP: 4.0000 mg | ORAL_TABLET | Freq: Four times a day (QID) | ORAL | Status: DC | PRN

## 2015-09-08 MED ORDER — LACTATED RINGERS IV SOLN
INTRAVENOUS | Status: DC | PRN
  Administered 2015-09-08: 09:00:00 via INTRAVENOUS

## 2015-09-08 MED ORDER — DEXAMETHASONE SODIUM PHOSPHATE 10 MG/ML IJ SOLN
INTRAMUSCULAR | Status: DC | PRN
  Administered 2015-09-08: 10 mg via INTRAVENOUS

## 2015-09-08 MED ORDER — ONDANSETRON HCL 4 MG/2ML IJ SOLN
INTRAMUSCULAR | Status: DC | PRN
  Administered 2015-09-08: 4 mg via INTRAVENOUS

## 2015-09-08 MED ORDER — ONDANSETRON HCL 4 MG/2ML IJ SOLN
4.0000 mg | Freq: Four times a day (QID) | INTRAMUSCULAR | Status: DC | PRN
  Administered 2015-09-08: 4 mg via INTRAVENOUS
  Filled 2015-09-08: qty 2

## 2015-09-08 MED ORDER — ROCURONIUM BROMIDE 100 MG/10ML IV SOLN
INTRAVENOUS | Status: DC | PRN
  Administered 2015-09-08: 30 mg via INTRAVENOUS

## 2015-09-08 MED ORDER — HYDROMORPHONE HCL 1 MG/ML IJ SOLN
0.2500 mg | INTRAMUSCULAR | Status: DC | PRN
  Administered 2015-09-08 (×4): 0.5 mg via INTRAVENOUS

## 2015-09-08 MED ORDER — KCL IN DEXTROSE-NACL 20-5-0.9 MEQ/L-%-% IV SOLN
INTRAVENOUS | Status: AC
Start: 2015-09-08 — End: 2015-09-08
  Filled 2015-09-08: qty 1000

## 2015-09-08 MED ORDER — DIVALPROEX SODIUM ER 250 MG PO TB24
250.0000 mg | ORAL_TABLET | Freq: Every day | ORAL | Status: DC
  Filled 2015-09-08 (×2): qty 1

## 2015-09-08 MED ORDER — LISINOPRIL 10 MG PO TABS
10.0000 mg | ORAL_TABLET | Freq: Every day | ORAL | Status: DC
  Filled 2015-09-08 (×2): qty 1

## 2015-09-08 MED ORDER — LIDOCAINE HCL (CARDIAC) 20 MG/ML IV SOLN
INTRAVENOUS | Status: DC | PRN
  Administered 2015-09-08: 100 mg via INTRAVENOUS

## 2015-09-08 MED ORDER — HYDROCHLOROTHIAZIDE 12.5 MG PO CAPS
12.5000 mg | ORAL_CAPSULE | Freq: Every day | ORAL | Status: DC
  Filled 2015-09-08 (×2): qty 1

## 2015-09-08 MED ORDER — FENTANYL CITRATE (PF) 100 MCG/2ML IJ SOLN: 25.0000 ug | INTRAMUSCULAR | Status: DC | PRN

## 2015-09-08 MED ORDER — SUGAMMADEX SODIUM 500 MG/5ML IV SOLN
INTRAVENOUS | Status: AC
  Filled 2015-09-08: qty 5

## 2015-09-08 SURGICAL SUPPLY — 34 items
APPLIER CLIP ROT 10 11.4 M/L (STAPLE)
CLIP APPLIE ROT 10 11.4 M/L (STAPLE) IMPLANT
COVER SURGICAL LIGHT HANDLE (MISCELLANEOUS) ×2 IMPLANT
CUTTER FLEX LINEAR 45M (STAPLE) ×2 IMPLANT
DECANTER SPIKE VIAL GLASS SM (MISCELLANEOUS) ×2 IMPLANT
DRAPE LAPAROSCOPIC ABDOMINAL (DRAPES) ×2 IMPLANT
DRAPE UTILITY XL STRL (DRAPES) ×2 IMPLANT
ELECT PENCIL ROCKER SW 15FT (MISCELLANEOUS) ×2 IMPLANT
ELECT REM PT RETURN 9FT ADLT (ELECTROSURGICAL) ×2
ELECTRODE REM PT RTRN 9FT ADLT (ELECTROSURGICAL) ×1 IMPLANT
ENDOLOOP SUT PDS II  0 18 (SUTURE)
ENDOLOOP SUT PDS II 0 18 (SUTURE) IMPLANT
GLOVE BIO SURGEON STRL SZ7.5 (GLOVE) ×2 IMPLANT
GOWN STRL REUS W/ TWL XL LVL3 (GOWN DISPOSABLE) ×1 IMPLANT
GOWN STRL REUS W/TWL XL LVL3 (GOWN DISPOSABLE) ×5 IMPLANT
IV LACTATED RINGERS 1000ML (IV SOLUTION) ×2 IMPLANT
KIT BASIN OR (CUSTOM PROCEDURE TRAY) ×2 IMPLANT
LIQUID BAND (GAUZE/BANDAGES/DRESSINGS) ×2 IMPLANT
NS IRRIG 1000ML POUR BTL (IV SOLUTION) ×2 IMPLANT
POUCH SPECIMEN RETRIEVAL 10MM (ENDOMECHANICALS) IMPLANT
RELOAD 45 VASCULAR/THIN (ENDOMECHANICALS) IMPLANT
RELOAD STAPLE TA45 3.5 REG BLU (ENDOMECHANICALS) ×2 IMPLANT
SET IRRIG TUBING LAPAROSCOPIC (IRRIGATION / IRRIGATOR) ×2 IMPLANT
SHEARS HARMONIC ACE PLUS 36CM (ENDOMECHANICALS) ×2 IMPLANT
SOLUTION ANTI FOG 6CC (MISCELLANEOUS) ×2 IMPLANT
SUT MNCRL AB 4-0 PS2 18 (SUTURE) ×2 IMPLANT
TOWEL OR 17X26 10 PK STRL BLUE (TOWEL DISPOSABLE) ×2 IMPLANT
TRAY FOLEY W/METER SILVER 14FR (SET/KITS/TRAYS/PACK) IMPLANT
TRAY FOLEY W/METER SILVER 16FR (SET/KITS/TRAYS/PACK) ×2 IMPLANT
TRAY LAPAROSCOPIC (CUSTOM PROCEDURE TRAY) ×2 IMPLANT
TROCAR BLADELESS OPT 5 75 (ENDOMECHANICALS) ×2 IMPLANT
TROCAR SLEEVE XCEL 5X75 (ENDOMECHANICALS) ×2 IMPLANT
TROCAR XCEL BLUNT TIP 100MML (ENDOMECHANICALS) ×2 IMPLANT
TUBING INSUFFLATION 10FT LAP (TUBING) ×2 IMPLANT

## 2015-09-08 NOTE — Anesthesia Procedure Notes (Signed)
Procedure Name: Intubation Date/Time: 09/08/2015 9:02 AM Performed by: Doran ClayALDAY, Terrea Bruster R Pre-anesthesia Checklist: Patient identified, Timeout performed, Emergency Drugs available, Suction available and Patient being monitored Patient Re-evaluated:Patient Re-evaluated prior to inductionOxygen Delivery Method: Circle system utilized Preoxygenation: Pre-oxygenation with 100% oxygen Intubation Type: IV induction, Rapid sequence and Cricoid Pressure applied Laryngoscope Size: Mac and 4 Grade View: Grade I Tube type: Oral Tube size: 7.5 mm Number of attempts: 1 Airway Equipment and Method: Stylet Placement Confirmation: ETT inserted through vocal cords under direct vision,  positive ETCO2 and breath sounds checked- equal and bilateral Secured at: 23 cm Tube secured with: Tape Dental Injury: Teeth and Oropharynx as per pre-operative assessment

## 2015-09-08 NOTE — Progress Notes (Signed)
After giving pt. Morphine, pt noted to be actively vomiting.  Prn antiemetic given and effective.  Surgeon on call paged and gave new orders.  See new orders.   Levora AngelHannah V Nadean Montanaro, RN

## 2015-09-08 NOTE — Anesthesia Postprocedure Evaluation (Signed)
Anesthesia Post Note  Patient: Roberto BowWilliam Stukes Jr.  Procedure(s) Performed: Procedure(s) (LRB): APPENDECTOMY LAPAROSCOPIC (N/A)  Patient location during evaluation: PACU Anesthesia Type: General Level of consciousness: awake Pain management: pain level controlled Respiratory status: spontaneous breathing Cardiovascular status: stable Anesthetic complications: no    Last Vitals:  Filed Vitals:   09/08/15 1115 09/08/15 1140  BP: 136/76 125/68  Pulse: 106 111  Temp: 37.1 C 37.1 C  Resp: 18 12    Last Pain:  Filed Vitals:   09/08/15 1144  PainSc: 5                  EDWARDS,[name]

## 2015-09-08 NOTE — Transfer of Care (Signed)
Immediate Anesthesia Transfer of Care Note  Patient: Roberto BowWilliam Pescador Jr.  Procedure(s) Performed: Procedure(s): APPENDECTOMY LAPAROSCOPIC (N/A)  Patient Location: PACU  Anesthesia Type:General  Level of Consciousness: sedated  Airway & Oxygen Therapy: Patient Spontanous Breathing and Patient connected to face mask oxygen  Post-op Assessment: Report given to RN and Post -op Vital signs reviewed and stable  Post vital signs: Reviewed and stable  Last Vitals:  Filed Vitals:   09/07/15 2330 09/08/15 0030  BP: 146/91 136/87  Pulse: 90 89  Temp:  37.2 C  Resp: 15 18    Complications: No apparent anesthesia complications

## 2015-09-08 NOTE — Anesthesia Preprocedure Evaluation (Addendum)
Anesthesia Evaluation  Patient identified by MRN, date of birth, ID band Patient awake    Reviewed: Allergy & Precautions, NPO status , Patient's Chart, lab work & pertinent test results  Airway Mallampati: II  TM Distance: >3 FB Neck ROM: Full    Dental   Pulmonary COPD, former smoker,    breath sounds clear to auscultation       Cardiovascular hypertension,  Rhythm:Regular Rate:Normal     Neuro/Psych    GI/Hepatic Neg liver ROS, History noted. CE   Endo/Other  negative endocrine ROS  Renal/GU negative Renal ROS     Musculoskeletal   Abdominal   Peds  Hematology   Anesthesia Other Findings   Reproductive/Obstetrics                            Anesthesia Physical Anesthesia Plan  ASA: III  Anesthesia Plan: General   Post-op Pain Management:    Induction: Intravenous and Rapid sequence  Airway Management Planned: Oral ETT  Additional Equipment:   Intra-op Plan:   Post-operative Plan: Extubation in OR  Informed Consent: I have reviewed the patients History and Physical, chart, labs and discussed the procedure including the risks, benefits and alternatives for the proposed anesthesia with the patient or authorized representative who has indicated his/her understanding and acceptance.   Dental advisory given  Plan Discussed with: CRNA, Anesthesiologist and Surgeon  Anesthesia Plan Comments:        Anesthesia Quick Evaluation

## 2015-09-08 NOTE — H&P (Signed)
Roberto Lewis. is an 34 y.o. male.   Chief Complaint: abdominal pain HPI:  The patient is a 34 year old white male who began having central abdominal pain about 3 to 4:00 this afternoon. The pain persisted through the evening and got worse during the nighttime. He denies any nausea or vomiting. He denies any fevers or chills. Since the pain persisted he came to the emergency department. A CT scan was performed which showed acute appendicitis but no evidence of perforation. His white count was 18,000.  Past Medical History  Diagnosis Date  . Migraine   . Anxiety     (f/b by psych)  . Smoker     former  . Hypertension     Past Surgical History  Procedure Laterality Date  . Incision and drainage abscess anal  04/10/2011    Dr Hassell Done  . Incision and drainage abscess anal  May 2013    Dr Ninfa Linden  . Incision and drainage abscess anal  09/15/2012    Dr Lilyan Punt    Family History  Problem Relation Age of Onset  . COPD Father   . Heart disease Sister     congenital   Social History:  reports that he quit smoking about 4 years ago. His smoking use included Cigarettes. He smoked 1.00 pack per day. He has never used smokeless tobacco. He reports that he does not drink alcohol or use illicit drugs.  Allergies:  Allergies  Allergen Reactions  . Effexor [Venlafaxine Hydrochloride] Other (See Comments)    Throat swelling, blurred vision     (Not in a hospital admission)  Results for orders placed or performed during the hospital encounter of 09/07/15 (from the past 48 hour(s))  Lipase, blood     Status: None   Collection Time: 09/07/15  8:20 PM  Result Value Ref Range   Lipase 47 11 - 51 U/L  Comprehensive metabolic panel     Status: Abnormal   Collection Time: 09/07/15  8:20 PM  Result Value Ref Range   Sodium 139 135 - 145 mmol/L   Potassium 4.2 3.5 - 5.1 mmol/L   Chloride 100 (L) 101 - 111 mmol/L   CO2 29 22 - 32 mmol/L   Glucose, Bld 108 (H) 65 - 99 mg/dL   BUN 21 (H) 6 -  20 mg/dL   Creatinine, Ser 1.36 (H) 0.61 - 1.24 mg/dL   Calcium 9.2 8.9 - 10.3 mg/dL   Total Protein 7.6 6.5 - 8.1 g/dL   Albumin 4.5 3.5 - 5.0 g/dL   AST 55 (H) 15 - 41 U/L   ALT 80 (H) 17 - 63 U/L   Alkaline Phosphatase 77 38 - 126 U/L   Total Bilirubin 0.5 0.3 - 1.2 mg/dL   GFR calc non Af Amer >60 >60 mL/min   GFR calc Af Amer >60 >60 mL/min    Comment: (NOTE) The eGFR has been calculated using the CKD EPI equation. This calculation has not been validated in all clinical situations. eGFR's persistently <60 mL/min signify possible Chronic Kidney Disease.    Anion gap 10 5 - 15  CBC     Status: Abnormal   Collection Time: 09/07/15  8:20 PM  Result Value Ref Range   WBC 18.0 (H) 4.0 - 10.5 K/uL   RBC 4.75 4.22 - 5.81 MIL/uL   Hemoglobin 15.1 13.0 - 17.0 g/dL   HCT 44.8 39.0 - 52.0 %   MCV 94.3 78.0 - 100.0 fL   MCH 31.8 26.0 - 34.0  pg   MCHC 33.7 30.0 - 36.0 g/dL   RDW 12.2 11.5 - 15.5 %   Platelets 286 150 - 400 K/uL  Urinalysis, Routine w reflex microscopic (not at [facility])     Status: None   Collection Time: 09/07/15  8:48 PM  Result Value Ref Range   Color, Urine YELLOW YELLOW   APPearance CLEAR CLEAR   Specific Gravity, Urine 1.025 1.005 - 1.030   pH 7.0 5.0 - 8.0   Glucose, UA NEGATIVE NEGATIVE mg/dL   Hgb urine dipstick NEGATIVE NEGATIVE   Bilirubin Urine NEGATIVE NEGATIVE   Ketones, ur NEGATIVE NEGATIVE mg/dL   Protein, ur NEGATIVE NEGATIVE mg/dL   Nitrite NEGATIVE NEGATIVE   Leukocytes, UA NEGATIVE NEGATIVE    Comment: MICROSCOPIC NOT DONE ON URINES WITH NEGATIVE PROTEIN, BLOOD, LEUKOCYTES, NITRITE, OR GLUCOSE <1000 mg/dL.   Ct Abdomen Pelvis W Contrast  09/07/2015  CLINICAL DATA:  Sharp right lower quadrant pain for 1 day with nausea. EXAM: CT ABDOMEN AND PELVIS WITH CONTRAST TECHNIQUE: Multidetector CT imaging of the abdomen and pelvis was performed using the standard protocol following bolus administration of intravenous contrast. CONTRAST:  154m OMNIPAQUE  IOHEXOL 300 MG/ML  SOLN COMPARISON:  None. FINDINGS: Walls of the appendix appear thickened throughout and there is considerable periappendiceal inflammation/fluid stranding indicating acute appendicitis. No appendicolith identified. No periappendiceal abscess collection seen. No free intraperitoneal air. Liver is diffusely low in density indicating fatty infiltration. Gallbladder, spleen, pancreas, and adrenal glands appear normal. Kidneys appear normal without stone or hydronephrosis. Abdominal aorta is normal in caliber. Stomach appears normal. Small bowel is normal in caliber and configuration. Large bowel is normal in caliber and configuration. Lung bases are clear. No osseous abnormality. IMPRESSION: 1. Findings are consistent with acute appendicitis. Walls of the appendix appear thickened and there is considerable periappendiceal inflammation/fluid stranding. No periappendiceal abscess collection identified. No free intraperitoneal air. 2. Fatty infiltration of the liver. These results were called by telephone at the time of interpretation on 09/07/2015 at 11:21 pm to Dr. EGareth Morgan, who verbally acknowledged these results. Electronically Signed   By: SFranki CabotM.D.   On: 09/07/2015 23:22    Review of Systems  Constitutional: Negative.   HENT: Negative.   Eyes: Negative.   Respiratory: Negative.   Cardiovascular: Negative.   Gastrointestinal: Positive for abdominal pain. Negative for nausea and vomiting.  Genitourinary: Negative.   Musculoskeletal: Negative.   Skin: Negative.   Neurological: Negative.   Endo/Heme/Allergies: Negative.   Psychiatric/Behavioral: Negative.     Blood pressure 146/91, pulse 90, temperature 98.3 F (36.8 C), temperature source Oral, resp. rate 15, height _0  (1.88 m), weight 113.399 kg (250 lb), SpO2 98 %. Physical Exam  Constitutional: He is oriented to person, place, and time. He appears well-developed and well-nourished.  HENT:  Head:  Normocephalic and atraumatic.  Eyes: Conjunctivae and EOM are normal. Pupils are equal, round, and reactive to light.  Neck: Normal range of motion. Neck supple.  Cardiovascular: Normal rate, regular rhythm and normal heart sounds.   Respiratory: Effort normal and breath sounds normal.  GI: Soft. Bowel sounds are normal.  There is mild tenderness to palpation in RLQ. No peritonitis  Musculoskeletal: Normal range of motion.  Neurological: He is alert and oriented to person, place, and time.  Skin: Skin is warm and dry.  Psychiatric: He has a normal mood and affect. His behavior is normal.     Assessment/Plan  The patient appears to have acute appendicitis. Because  of the risk of perforation and sepsis with acute benefit from having his appendix removed. I have discussed with him in detail the risks and benefits of the operation to remove the appendix as well as some of the technical aspects and he understands and wishes to proceed. We will start him on Zosyn and plan to do the surgery first thing in the morning.  TOTH III,Alfonso Carden S 09/08/2015, 12:08 AM

## 2015-09-08 NOTE — Op Note (Signed)
09/07/2015 - 09/08/2015  10:12 AM  PATIENT:  Roberto BowWilliam Breau Jr.  34 y.o. male  PRE-OPERATIVE DIAGNOSIS:  acute appendicitis  POST-OPERATIVE DIAGNOSIS:  acute appendicitis  PROCEDURE:  Procedure(s): APPENDECTOMY LAPAROSCOPIC (N/A)  SURGEON:  Surgeon(s) and Role:    * Griselda MinerPaul Toth III, MD - Primary  PHYSICIAN ASSISTANT:   ASSISTANTS: none   ANESTHESIA:   general  EBL:     BLOOD ADMINISTERED:none  DRAINS: none   LOCAL MEDICATIONS USED:  MARCAINE     SPECIMEN:  Source of Specimen:  appendix  DISPOSITION OF SPECIMEN:  PATHOLOGY  COUNTS:  YES  TOURNIQUET:  * No tourniquets in log *  DICTATION: .Dragon Dictation   After informed consent was obtained patient was brought to the operating room placed in the supine position on the operating room table. After adequate induction of general anesthesia the patient's abdomen was prepped with ChloraPrep, allowed to dry, and draped in usual sterile manner. The area below the umbilicus was infiltrated with quarter percent Marcaine. A small incision was made with a 15 blade knife. This incision was carried down through the subcutaneous tissue bluntly with a hemostat and [employer] retractors until the linea alba was identified. The linea alba was incised with a 15 blade knife. Each side was grasped Coker clamps and elevated anteriorly. The preperitoneal space was probed bluntly with a hemostat until the peritoneum was opened and access was gained to the abdominal cavity. A 0 Vicryl purse string stitch was placed in the fascia surrounding the opening. A Hassan cannula was placed through the opening and anchored in place with the previously placed Vicryl purse string stitch. The laparoscope was placed through the [facility]. The abdomen was insufflated with carbon dioxide without difficulty. Next the suprapubic area was infiltrated with quarter percent Marcaine. A small incision was made with a 15 blade knife. A 5 mm port was placed bluntly through  this incision into the abdominal cavity. A site was then chosen in the left upper quadrant for placement of a 5 mm port. The area was infiltrated with quarter percent Marcaine. A small stab incision was made with a 15 blade knife. A 5 mm port was placed bluntly through this incision and the abdominal cavity under direct vision. The laparoscope was then moved to the suprapubic port. Using a Glassman grasper and harmonic scalpel the right lower quadrant was inspected. The appendix was readily identified. The appendix was elevated anteriorly and the mesoappendix was taken down sharply with the harmonic scalpel. Once the base of the appendix where it joined the cecum was identified and cleared of any tissue then a laparoscopic GIA blue load 6 row stapler was placed through the [facility]. The stapler was placed across the base of the appendix clamped and fired thereby dividing the base of the appendix between staple lines. A laparoscopic bag was then inserted through the [facility]. The appendix was placed within the bag and the bag was sealed. The abdomen was then irrigated with copious amounts of saline until the effluent was clear. No other abnormalities were noted. The appendix and bag were removed with the [facility] through the infraumbilical port without difficulty. The fascial defect was closed with the previously placed Vicryl pursestring stitch as well as with another interrupted 0 Vicryl figure-of-eight stitch. The rest of the ports were removed under direct vision and were found to be hemostatic. The gas was allowed to escape. The skin incisions were closed with interrupted 4-0 Monocryl subcuticular stitches. Dermabond dressings were  applied. The patient tolerated the procedure well. At the end of the case all needle sponge and instrument counts were correct. The patient was then awakened and taken to recovery in stable condition.  PLAN OF CARE: Admit for overnight observation  PATIENT  DISPOSITION:  PACU - hemodynamically stable.   Delay start of Pharmacological VTE agent (>24hrs) due to surgical blood loss or risk of bleeding: no

## 2015-09-09 ENCOUNTER — Encounter (HOSPITAL_COMMUNITY): Payer: Self-pay | Admitting: General Surgery

## 2015-09-09 MED ORDER — OXYCODONE-ACETAMINOPHEN 5-325 MG PO TABS: 1.0000 | ORAL_TABLET | ORAL | Status: DC | PRN

## 2015-09-09 NOTE — Progress Notes (Signed)
Patient tolerated regular diet well, ambulating in halls, pain controlled with oral pain medications, passing gas and without nausea or vomiting.  Discharge instructions reviewed with patient.  Patient discharged to self care with spouse.

## 2015-09-09 NOTE — Addendum Note (Signed)
Addendum  created 09/09/15 1533 by Elyn PeersSandra J Dimitri Shakespeare, CRNA   Modules edited: Charges VN

## 2015-09-09 NOTE — Progress Notes (Signed)
1 Day Post-Op  Subjective: No complaints  Objective: Vital signs in last 24 hours: Temp:  [97.6 F (36.4 C)-99.6 F (37.6 C)] 97.7 F (36.5 C) (12/19 0515) Pulse Rate:  [72-123] 85 (12/19 0515) Resp:  [12-18] 18 (12/19 0515) BP: (115-144)/(61-77) 115/63 mmHg (12/19 0515) SpO2:  [96 %-100 %] 96 % (12/19 0515) Last BM Date: 09/07/15  Intake/Output from previous day: 12/18 0701 - 12/19 0700 In: 2920 [P.O.:720; I.V.:2200] Out: 1750 [Urine:1750] Intake/Output this shift:    Resp: clear to auscultation bilaterally Cardio: regular rate and rhythm GI: soft, nontender. incisions look good  Lab Results:   Recent Labs  09/07/15 2020  WBC 18.0*  HGB 15.1  HCT 44.8  PLT 286   BMET  Recent Labs  09/07/15 2020  NA 139  K 4.2  CL 100*  CO2 29  GLUCOSE 108*  BUN 21*  CREATININE 1.36*  CALCIUM 9.2   PT/INR No results for input(s): LABPROT, INR in the last 72 hours. ABG No results for input(s): PHART, HCO3 in the last 72 hours.  Invalid input(s): PCO2, PO2  Studies/Results: Ct Abdomen Pelvis W Contrast  09/07/2015  CLINICAL DATA:  Sharp right lower quadrant pain for 1 day with nausea. EXAM: CT ABDOMEN AND PELVIS WITH CONTRAST TECHNIQUE: Multidetector CT imaging of the abdomen and pelvis was performed using the standard protocol following bolus administration of intravenous contrast. CONTRAST:  100mL OMNIPAQUE IOHEXOL 300 MG/ML  SOLN COMPARISON:  None. FINDINGS: Walls of the appendix appear thickened throughout and there is considerable periappendiceal inflammation/fluid stranding indicating acute appendicitis. No appendicolith identified. No periappendiceal abscess collection seen. No free intraperitoneal air. Liver is diffusely low in density indicating fatty infiltration. Gallbladder, spleen, pancreas, and adrenal glands appear normal. Kidneys appear normal without stone or hydronephrosis. Abdominal aorta is normal in caliber. Stomach appears normal. Small bowel is normal  in caliber and configuration. Large bowel is normal in caliber and configuration. Lung bases are clear. No osseous abnormality. IMPRESSION: 1. Findings are consistent with acute appendicitis. Walls of the appendix appear thickened and there is considerable periappendiceal inflammation/fluid stranding. No periappendiceal abscess collection identified. No free intraperitoneal air. 2. Fatty infiltration of the liver. These results were called by telephone at the time of interpretation on 09/07/2015 at 11:21 pm to Dr. Alvira MondayERIN SCHLOSSMAN , who verbally acknowledged these results. Electronically Signed   By: Bary RichardStan  Maynard M.D.   On: 09/07/2015 23:22    Anti-infectives: Anti-infectives    Start     Dose/Rate Route Frequency Ordered Stop   09/08/15 0800  piperacillin-tazobactam (ZOSYN) IVPB 3.375 g     3.375 g 12.5 mL/hr over 240 Minutes Intravenous Every 8 hours 09/08/15 0029     09/07/15 2330  piperacillin-tazobactam (ZOSYN) IVPB 3.375 g  Status:  Discontinued     3.375 g 12.5 mL/hr over 240 Minutes Intravenous  Once 09/07/15 2322 09/08/15 0031      Assessment/Plan: s/p Procedure(s): APPENDECTOMY LAPAROSCOPIC (N/A) Advance diet Discharge     TOTH III,Fabrizio Filip S 09/09/2015

## 2015-09-10 NOTE — Discharge Summary (Signed)
Physician Discharge Summary  Patient ID: Roberto BowWilliam Agresti Jr. MRN: [id number] DOB/AGE: March 06, 1981 34 y.o.  Admit date: 09/07/2015 Discharge date: 09/10/2015  Admission Diagnoses:  Discharge Diagnoses:  Active Problems:   Appendicitis   Discharged Condition: good  Hospital Course: the pt underwent lap appy. He tolerated surgery well. On pod 1 he was ready for discharge home  Consults: None  Significant Diagnostic Studies: none  Treatments: surgery: as above  Discharge Exam: Blood pressure 115/63, pulse 85, temperature 97.7 F (36.5 C), temperature source Oral, resp. rate 18, height 6\' 2"  (1.88 m), weight 113.399 kg (250 lb), SpO2 96 %. Resp: clear to auscultation bilaterally Cardio: regular rate and rhythm GI: soft, minimal tenderness. incisions look good  Disposition: 01-Home or Self Care  Discharge Instructions    Call MD for:  difficulty breathing, headache or visual disturbances    Complete by:  As directed      Call MD for:  extreme fatigue    Complete by:  As directed      Call MD for:  hives    Complete by:  As directed      Call MD for:  persistant dizziness or light-headedness    Complete by:  As directed      Call MD for:  persistant nausea and vomiting    Complete by:  As directed      Call MD for:  redness, tenderness, or signs of infection (pain, swelling, redness, odor or green/yellow discharge around incision site)    Complete by:  As directed      Call MD for:  severe uncontrolled pain    Complete by:  As directed      Call MD for:  temperature >100.4    Complete by:  As directed      Diet - low sodium heart healthy    Complete by:  As directed      Discharge instructions    Complete by:  As directed   May shower. Diet as tolerated. No heavy lifting     Increase activity slowly    Complete by:  As directed      No wound care    Complete by:  As directed             Medication List    TAKE these medications        acetaminophen 500 MG  tablet  Commonly known as:  TYLENOL  Take 1,000 mg by mouth every 6 (six) hours as needed for moderate pain or headache.     ARIPiprazole 10 MG tablet  Commonly known as:  ABILIFY  Take 10 mg by mouth daily.     divalproex 250 MG 24 hr tablet  Commonly known as:  DEPAKOTE ER  Take 250 mg by mouth daily.     doxycycline 100 MG tablet  Commonly known as:  VIBRA-TABS  Take 1 tablet (100 mg total) by mouth 2 (two) times daily.     GOODY HEADACHE PO  Take 1 packet by mouth daily as needed (headache).     lisinopril-hydrochlorothiazide 10-12.5 MG tablet  Commonly known as:  PRINZIDE,ZESTORETIC  TAKE ONE TABLET BY MOUTH ONE TIME DAILY **NEW INS CARD??     oxyCODONE-acetaminophen 5-325 MG tablet  Commonly known as:  ROXICET  Take 1-2 tablets by mouth every 4 (four) hours as needed for severe pain.     PEPCID AC PO  Take 1 tablet by mouth daily as needed (acid reflux).  Follow-up Information    Follow up with Robyne Askew, MD In 2 weeks.   Specialty:  General Surgery   Contact information:   [address] [place] [address] [phone]       Signed: Robyne Askew 09/10/2015, 4:31 PM

## 2015-11-27 ENCOUNTER — Ambulatory Visit (INDEPENDENT_AMBULATORY_CARE_PROVIDER_SITE_OTHER): Payer: BLUE CROSS/BLUE SHIELD | Admitting: Medical

## 2015-11-27 ENCOUNTER — Encounter: Payer: Self-pay | Admitting: Medical

## 2015-11-27 VITALS — BP 122/70 | HR 111 | Wt 243.0 lb

## 2015-11-27 DIAGNOSIS — R7989 Other specified abnormal findings of blood chemistry: Secondary | ICD-10-CM

## 2015-11-27 DIAGNOSIS — R5382 Chronic fatigue, unspecified: Secondary | ICD-10-CM

## 2015-11-27 DIAGNOSIS — R7301 Impaired fasting glucose: Secondary | ICD-10-CM | POA: Diagnosis not present

## 2015-11-27 DIAGNOSIS — R21 Rash and other nonspecific skin eruption: Secondary | ICD-10-CM

## 2015-11-27 DIAGNOSIS — R945 Abnormal results of liver function studies: Secondary | ICD-10-CM

## 2015-11-27 DIAGNOSIS — N289 Disorder of kidney and ureter, unspecified: Secondary | ICD-10-CM

## 2015-11-27 MED ORDER — FAMOTIDINE 40 MG PO TABS: 40.0000 mg | ORAL_TABLET | Freq: Every day | ORAL | Status: DC

## 2015-11-27 MED ORDER — DIPHENHYDRAMINE HCL 25 MG PO TABS: ORAL_TABLET | ORAL | Status: DC

## 2015-11-27 NOTE — Patient Instructions (Signed)
Pityriasis Rosea  Pityriasis rosea is a rash that usually appears on the trunk of the body. It may also appear on the upper arms and upper legs. It usually begins as a single patch, and then more patches begin to develop. The rash may cause mild itching, but it normally does not cause other problems. It usually goes away without treatment. However, it may take weeks or months for the rash to go away completely.  CAUSES  The cause of this condition is not known. The condition does not spread from person to person (is noncontagious).  RISK FACTORS  This condition is more likely to develop in young adults and children. It is most common in the spring and fall.  SYMPTOMS  The main symptom of this condition is a rash.  · The rash usually begins with a single oval patch that is larger than the ones that follow. This is called a herald patch. It generally appears a week or more before the rest of the rash appears.  · When more patches start to develop, they spread quickly on the trunk, back, and arms. These patches are smaller than the first one.  · The patches that make up the rash are usually oval-shaped and pink or red in color. They are usually flat, but they may sometimes be raised so that they can be felt with a finger. They may also be finely crinkled and have a scaly ring around the edge.  · The rash does not typically appear on areas of the skin that are exposed to the sun.  Most people who have this condition do not have other symptoms, but some have mild itching. In a few cases, a mild headache or body aches may occur before the rash appears and then go away.  DIAGNOSIS  Your health care provider may diagnose this condition by doing a physical exam and taking your medical history. To rule out other possible causes for the rash, the health care provider may order blood tests or take a skin sample from the rash to be looked at under a microscope.  TREATMENT  Usually, treatment is not needed for this condition. The  rash will probably go away on its own in 4-8 weeks. In some cases, a health care provider may recommend or prescribe medicine to reduce itching.  HOME CARE INSTRUCTIONS  · Take medicines only as directed by your health care provider.  · Avoid scratching the affected areas of skin.  · Do not take hot baths or use a sauna. Use only warm water when bathing or showering. Heat can increase itching.  SEEK MEDICAL CARE IF:  · Your rash does not go away in 8 weeks.  · Your rash gets much worse.  · You have a fever.  · You have swelling or pain in the rash area.  · You have fluid, blood, or pus coming from the rash area.     This information is not intended to replace advice given to you by your health care provider. Make sure you discuss any questions you have with your health care provider.     Document Released: 10/14/2001 Document Revised: 01/22/2015 Document Reviewed: 08/15/2014  Elsevier Interactive Patient Education ©2016 [employer]

## 2015-11-27 NOTE — Progress Notes (Signed)
Subjective:   Roberto BowWilliam Mizer Jr. is a 35 y.o. male who presents for evaluation of a rash.  Started 4- 5weeks ago.  Its very itchy, flares up worse from time to time.   The first rash he noticed was chest and neck.    [facility], and was told it was possibly contact dermatitis.   It has spread to armpits, groin, on penis on legs.    No one in household has same rash.  No new exposures.  No fever, no body aches, no chills, no myalgia.   He does note chronic fatigue.  No other aggravating or relieving factors. No other complaint.  The following portions of the patient's history were reviewed and updated as appropriate: allergies, current medications, past family history, past medical history, past social history and problem list.  Review of Systems As in subjective above   Objective:   Gen: wd, wn, nad Skin: scattered pink /red nonspecific slightly raised rash throughout neck, torso, axilla.  Few areas of larger scabbed lesions on torso, arms, glans of penis left ankle.  nonspecific   Assessment:   Encounter Diagnoses  Name Primary?  . Rash and nonspecific skin eruption Yes  . Elevated LFTs   . Impaired fasting blood sugar   . Chronic fatigue   . Renal insufficiency         Plan:   Rash - etiology pityriasis rosea vs hypersensitivity dermatitis, unclear.  Doubt scabies.  Etiology not completely clear.  He does not do well with prednisone combined with his Abilify and Depakote.   Begin benadryl, famotidine, can use the steroid cream he has at home for worse rash, avoid spicy foods, hot showers, sweets and unhealthy foods.   If not improving within a week let me know.  Return in a few months for recheck on labs given prior abnormal labs.    Of note, he has fatty liver disease on CT just a few months ago, prior mildly elevated LFTs, impaired glucose.   Advised diet and exercise and weight loss changes.

## 2015-12-07 ENCOUNTER — Other Ambulatory Visit: Payer: Self-pay | Admitting: Family Medicine

## 2015-12-16 ENCOUNTER — Ambulatory Visit (INDEPENDENT_AMBULATORY_CARE_PROVIDER_SITE_OTHER): Payer: BLUE CROSS/BLUE SHIELD | Admitting: Medical

## 2015-12-16 ENCOUNTER — Encounter: Payer: Self-pay | Admitting: Medical

## 2015-12-16 VITALS — BP 146/94 | HR 112 | Temp 97.4°F | Resp 16 | Wt 240.0 lb

## 2015-12-16 DIAGNOSIS — R945 Abnormal results of liver function studies: Principal | ICD-10-CM

## 2015-12-16 DIAGNOSIS — D692 Other nonthrombocytopenic purpura: Secondary | ICD-10-CM | POA: Diagnosis not present

## 2015-12-16 DIAGNOSIS — R7989 Other specified abnormal findings of blood chemistry: Secondary | ICD-10-CM

## 2015-12-16 DIAGNOSIS — R21 Rash and other nonspecific skin eruption: Secondary | ICD-10-CM

## 2015-12-16 DIAGNOSIS — R52 Pain, unspecified: Secondary | ICD-10-CM

## 2015-12-16 DIAGNOSIS — R000 Tachycardia, unspecified: Secondary | ICD-10-CM | POA: Diagnosis not present

## 2015-12-16 DIAGNOSIS — R7301 Impaired fasting glucose: Secondary | ICD-10-CM | POA: Diagnosis not present

## 2015-12-16 LAB — HEPATIC FUNCTION PANEL
ALBUMIN: 4.6 g/dL (ref 3.6–5.1)
ALK PHOS: 70 U/L (ref 40–115)
ALT: 74 U/L — ABNORMAL HIGH (ref 9–46)
AST: 58 U/L — AB (ref 10–40)
Bilirubin, Direct: 0.2 mg/dL (ref <=0.2)
Indirect Bilirubin: 0.5 mg/dL (ref 0.2–1.2)
TOTAL PROTEIN: 7.3 g/dL (ref 6.1–8.1)
Total Bilirubin: 0.7 mg/dL (ref 0.2–1.2)

## 2015-12-16 LAB — CBC WITH DIFFERENTIAL/PLATELET
BASOS ABS: 0 10*3/uL (ref 0.0–0.1)
BASOS PCT: 0 % (ref 0–1)
Eosinophils Absolute: 0.1 10*3/uL (ref 0.0–0.7)
Eosinophils Relative: 2 % (ref 0–5)
HCT: 46.6 % (ref 39.0–52.0)
HEMOGLOBIN: 16 g/dL (ref 13.0–17.0)
LYMPHS ABS: 1.8 10*3/uL (ref 0.7–4.0)
Lymphocytes Relative: 26 % (ref 12–46)
MCH: 31.1 pg (ref 26.0–34.0)
MCHC: 34.3 g/dL (ref 30.0–36.0)
MCV: 90.7 fL (ref 78.0–100.0)
MONOS PCT: 15 % — AB (ref 3–12)
MPV: 9.5 fL (ref 8.6–12.4)
Monocytes Absolute: 1.1 10*3/uL — ABNORMAL HIGH (ref 0.1–1.0)
NEUTROS ABS: 4 10*3/uL (ref 1.7–7.7)
NEUTROS PCT: 57 % (ref 43–77)
Platelets: 285 10*3/uL (ref 150–400)
RBC: 5.14 MIL/uL (ref 4.22–5.81)
RDW: 13.2 % (ref 11.5–15.5)
WBC: 7.1 10*3/uL (ref 4.0–10.5)

## 2015-12-16 LAB — HEPATITIS PANEL, ACUTE
HCV Ab: NEGATIVE
HEP A IGM: NONREACTIVE
Hep B C IgM: NONREACTIVE
Hepatitis B Surface Ag: NEGATIVE

## 2015-12-16 LAB — HEMOGLOBIN A1C
HEMOGLOBIN A1C: 5.7 % — AB (ref <5.7)
MEAN PLASMA GLUCOSE: 117 mg/dL

## 2015-12-16 NOTE — Progress Notes (Signed)
Subjective: Chief Complaint  Patient presents with  . flu like symptoms    and is getting things that looks like blood blisters on his fingers and hands, one is on his leg. has been having chills and a low grade fever. bodyaches. diarrhea.    Here for illness.  He notes Friday evening 4 days ago was feeling down, fatigued.   By night from waist down was having aches and pains.  Later into the night had stomach ache, fever, diarrhea, chills, body aches.   Never had cough, sore throat.  For past few weeks getting blood blisters under skin, left upper thigh, left middle, right index finger.  He bought some ducks, keeping them indoors under heat lamp.  Has changed poop container several times.  One of the ducks died but several ducks had some loose stool.  No other aggravating or relieving factors. No other complaint.  Past Medical History  Diagnosis Date  . Migraine   . Anxiety     (f/b by psych)  . Smoker     former  . Hypertension    Past Surgical History  Procedure Laterality Date  . Incision and drainage abscess anal  04/10/2011    Dr Daphine DeutscherMartin  . Incision and drainage abscess anal  May 2013    Dr Magnus IvanBlackman  . Incision and drainage abscess anal  09/15/2012    Dr Biagio QuintLayton  . Laparoscopic appendectomy N/A 09/08/2015    Procedure: APPENDECTOMY LAPAROSCOPIC;  Surgeon: Chevis PrettyPaul Toth III, MD;  Location: [facility];  Service: General;  Laterality: N/A;    ROS as in subjective   Objective: BP 146/94 mmHg  Pulse 112  Temp(Src) 97.4 F (36.3 C) (Tympanic)  Resp 16  Wt 240 lb (108.863 kg)  General appearance: alert, no distress, WD/WN HEENT: normocephalic, sclerae anicteric, TMs pearly, nares patent, no discharge or erythema, pharynx normal Oral cavity: MMM, no lesions Neck: supple, no lymphadenopathy, no thyromegaly, no masses,no JVD, no bruits Heart: around 100 bpm, otherwise RRR, normal S1, S2, no murmurs Lungs: CTA bilaterally, no wheezes, rhonchi, or rales Abdomen: +bs, soft, non tender,  non distended, no masses, no hepatomegaly, no splenomegaly Pulses: 2+ symmetric, upper and lower extremities, normal cap refill Skin: left distal 3rd phalanx swollen with purplish fluid underneath skin, similar finding of left upper thigh medially 1cm diameter swollen purplish lesion, right index finger with similar lesion that is resolving, no other abnormal skin findings    Assessment: Encounter Diagnoses  Name Primary?  . Elevated LFTs Yes  . Rash and nonspecific skin eruption   . Tachycardia   . Impaired fasting glucose   . Body aches   . Purpura (HCC)      Plan: etiology unclear.  Last recent labs showed elevated LFTs.   Additional labs today, hydrate well, rest, advised he not pop the blisters, he will take a picture of the left middle finger lesion for future refererence. Discussed case with Dr. Susann GivensLalonde supervising physician who also examined him.  F/u pending labs.

## 2015-12-17 LAB — SEDIMENTATION RATE: SED RATE: 4 mm/h (ref 0–15)

## 2015-12-17 LAB — RPR

## 2015-12-17 LAB — HIV ANTIBODY (ROUTINE TESTING W REFLEX): HIV 1&2 Ab, 4th Generation: NONREACTIVE

## 2016-02-06 ENCOUNTER — Telehealth: Payer: Self-pay

## 2016-02-06 NOTE — Telephone Encounter (Signed)
Pt called into office to report that he is having CP x 5-7 days. He was taking Lisinopril/HCTZ but forgot about doses for 2-3 days intermittently. He has been taking it daily for about 1 week. He reports that he has woke up the past 2 mornings and vomited. Pt is sweating more at work and becoming drained with feeling tired. C/O cough, but no SHOB. Describes paint as a constant pressure around his heart. He has not checked his BP.   Reviewed concerns with Dr. Susann GivensLalonde- he reports pt needs to be seen tomorrow 02/07/2016. Discussed symptom of emergent heart issues and when to go to the E.R. Pt scheduled for 02/07/16./ RLB

## 2016-02-07 ENCOUNTER — Encounter: Payer: Self-pay | Admitting: Family Medicine

## 2016-02-07 ENCOUNTER — Ambulatory Visit (INDEPENDENT_AMBULATORY_CARE_PROVIDER_SITE_OTHER): Payer: 59 | Admitting: Family Medicine

## 2016-02-07 VITALS — BP 126/70 | HR 88 | Wt 240.0 lb

## 2016-02-07 DIAGNOSIS — R0789 Other chest pain: Secondary | ICD-10-CM

## 2016-02-07 NOTE — Progress Notes (Signed)
   Subjective:    Patient ID: Roberto BowWilliam Bessey Jr., male    DOB: Aug 19, 1981, 35 y.o.   MRN: [id number]  HPI  He complains of a one-week history of intermittent chest pressure. Increase physical activity tends to increase it as well as lying down. He has also noted increased gas production and on at least one occasion he did have an episode of vomiting with increased pressure. He has noted some acid with the vomiting. He also had one episode became weak sensation in his left arm but not necessarily related to the pressure. He has not tried any medications. He has had no recent overuse injuries   Review of Systems     Objective:   Physical Exam Alert and in no distress. Tympanic membranes and canals are normal. Pharyngeal area is normal. Neck is supple without adenopathy or thyromegaly. Cardiac exam shows a regular sinus rhythm without murmurs or gallops. Lungs are clear to auscultation.  Abdominalexam shows no masses or tenderness.        Assessment & Plan:  Left chest pressure  I will have him try OTC Nexium. Samples given. He is to take 2 per day through the weekend and leave me a message on My Chart

## 2016-02-07 NOTE — Patient Instructions (Signed)
Take two Nexium daily through the weekend and let me know

## 2016-04-03 ENCOUNTER — Telehealth: Payer: Self-pay

## 2016-04-03 NOTE — Telephone Encounter (Signed)
[facility]. Trixie Rude/[facility]

## 2016-06-01 ENCOUNTER — Other Ambulatory Visit: Payer: Self-pay | Admitting: General Surgery

## 2017-04-08 ENCOUNTER — Other Ambulatory Visit: Payer: Self-pay | Admitting: Internal Medicine

## 2017-04-08 DIAGNOSIS — R740 Nonspecific elevation of levels of transaminase and lactic acid dehydrogenase [LDH]: Principal | ICD-10-CM

## 2017-04-08 DIAGNOSIS — R7401 Elevation of levels of liver transaminase levels: Secondary | ICD-10-CM

## 2017-04-15 ENCOUNTER — Other Ambulatory Visit: Payer: BLUE CROSS/BLUE SHIELD

## 2017-04-23 ENCOUNTER — Ambulatory Visit
Admission: RE | Admit: 2017-04-23 | Discharge: 2017-04-23 | Disposition: A | Discharge status: Home / Self Care | Payer: BLUE CROSS/BLUE SHIELD | Source: Ambulatory Visit | Attending: Internal Medicine | Admitting: Internal Medicine

## 2017-04-23 DIAGNOSIS — R740 Nonspecific elevation of levels of transaminase and lactic acid dehydrogenase [LDH]: Principal | ICD-10-CM

## 2017-04-23 DIAGNOSIS — R7401 Elevation of levels of liver transaminase levels: Secondary | ICD-10-CM

## 2017-08-26 ENCOUNTER — Emergency Department (HOSPITAL_COMMUNITY)
Admission: EM | Admit: 2017-08-26 | Discharge: 2017-08-26 | Disposition: A | Discharge status: Home / Self Care | Payer: Self-pay | Attending: Emergency Medicine | Admitting: Emergency Medicine

## 2017-08-26 ENCOUNTER — Other Ambulatory Visit: Payer: Self-pay

## 2017-08-26 ENCOUNTER — Emergency Department (HOSPITAL_COMMUNITY): Payer: Self-pay

## 2017-08-26 ENCOUNTER — Encounter (HOSPITAL_COMMUNITY): Payer: Self-pay | Admitting: Nurse Practitioner

## 2017-08-26 DIAGNOSIS — Z79899 Other long term (current) drug therapy: Secondary | ICD-10-CM | POA: Insufficient documentation

## 2017-08-26 DIAGNOSIS — I10 Essential (primary) hypertension: Secondary | ICD-10-CM | POA: Insufficient documentation

## 2017-08-26 DIAGNOSIS — R1032 Left lower quadrant pain: Secondary | ICD-10-CM | POA: Insufficient documentation

## 2017-08-26 DIAGNOSIS — F17210 Nicotine dependence, cigarettes, uncomplicated: Secondary | ICD-10-CM | POA: Insufficient documentation

## 2017-08-26 LAB — COMPREHENSIVE METABOLIC PANEL
ALK PHOS: 90 U/L (ref 38–126)
ALT: 75 U/L — AB (ref 17–63)
ANION GAP: 9 (ref 5–15)
AST: 56 U/L — ABNORMAL HIGH (ref 15–41)
Albumin: 4.4 g/dL (ref 3.5–5.0)
BUN: 20 mg/dL (ref 6–20)
CALCIUM: 9.5 mg/dL (ref 8.9–10.3)
CHLORIDE: 104 mmol/L (ref 101–111)
CO2: 25 mmol/L (ref 22–32)
CREATININE: 1.01 mg/dL (ref 0.61–1.24)
Glucose, Bld: 108 mg/dL — ABNORMAL HIGH (ref 65–99)
Potassium: 3.8 mmol/L (ref 3.5–5.1)
SODIUM: 138 mmol/L (ref 135–145)
Total Bilirubin: 0.8 mg/dL (ref 0.3–1.2)
Total Protein: 7.9 g/dL (ref 6.5–8.1)

## 2017-08-26 LAB — CBC WITH DIFFERENTIAL/PLATELET
BASOS ABS: 0 10*3/uL (ref 0.0–0.1)
Basophils Relative: 0 %
EOS PCT: 2 %
Eosinophils Absolute: 0.2 10*3/uL (ref 0.0–0.7)
HCT: 47.4 % (ref 39.0–52.0)
Hemoglobin: 16.4 g/dL (ref 13.0–17.0)
LYMPHS PCT: 32 %
Lymphs Abs: 2.9 10*3/uL (ref 0.7–4.0)
MCH: 31.4 pg (ref 26.0–34.0)
MCHC: 34.6 g/dL (ref 30.0–36.0)
MCV: 90.8 fL (ref 78.0–100.0)
MONO ABS: 0.9 10*3/uL (ref 0.1–1.0)
Monocytes Relative: 10 %
Neutro Abs: 5 10*3/uL (ref 1.7–7.7)
Neutrophils Relative %: 56 %
PLATELETS: 274 10*3/uL (ref 150–400)
RBC: 5.22 MIL/uL (ref 4.22–5.81)
RDW: 12.5 % (ref 11.5–15.5)
WBC: 8.9 10*3/uL (ref 4.0–10.5)

## 2017-08-26 LAB — URINALYSIS, ROUTINE W REFLEX MICROSCOPIC
BILIRUBIN URINE: NEGATIVE
Glucose, UA: NEGATIVE mg/dL
HGB URINE DIPSTICK: NEGATIVE
Ketones, ur: NEGATIVE mg/dL
Leukocytes, UA: NEGATIVE
NITRITE: NEGATIVE
PROTEIN: NEGATIVE mg/dL
Specific Gravity, Urine: 1.008 (ref 1.005–1.030)
pH: 6 (ref 5.0–8.0)

## 2017-08-26 LAB — LIPASE, BLOOD: LIPASE: 37 U/L (ref 11–51)

## 2017-08-26 MED ORDER — IOPAMIDOL (ISOVUE-300) INJECTION 61%
100.0000 mL | Freq: Once | INTRAVENOUS | Status: AC | PRN
  Administered 2017-08-26: 100 mL via INTRAVENOUS

## 2017-08-26 MED ORDER — IOPAMIDOL (ISOVUE-300) INJECTION 61%
INTRAVENOUS | Status: AC
  Filled 2017-08-26: qty 100

## 2017-08-26 MED ORDER — SODIUM CHLORIDE 0.9 % IV BOLUS (SEPSIS)
1000.0000 mL | Freq: Once | INTRAVENOUS | Status: AC
  Administered 2017-08-26: 1000 mL via INTRAVENOUS

## 2017-08-26 MED ORDER — ONDANSETRON HCL 4 MG PO TABS: 4.0000 mg | ORAL_TABLET | Freq: Three times a day (TID) | ORAL | 0 refills | Status: DC | PRN

## 2017-08-26 NOTE — ED Triage Notes (Signed)
Patient presents with c/o left side abdominal/flank area pain that he states radiates to the back area. States the pain I interemittent that comes in a variety of severity. Denies any urinary complications but the pain does make him nauseated. Patient reports seeing his PCP on Monday and and she started him on abx for diverticulitis. States no x-rays or anything were done at that time. Reports it is painful to lie on that left side, denies fever, but does report night sweats.

## 2017-08-26 NOTE — ED Provider Notes (Signed)
[facility]-EMERGENCY DEPT Provider Note   CSN: [id number] Arrival date & time: 08/26/17  [id number]     History   Chief Complaint Chief Complaint  Patient presents with  . Flank Pain    HPI Roberto BowWilliam Wakeman Jr. is a 36 y.o. male.  HPI Patient presents with left lower quadrant pain for the past 2 weeks.  States the pain is dull but occasionally has sharp pain that radiates through to his back.  Occasionally associated with nausea.  No fevers or chills but states he does have night sweats.  He has had intermittent grossly bloody stools.  Has a history of a perianal fistula.  Was seen by his primary doctor's office on Monday and started on antibiotics for diverticulitis.  States he has been taking the antibiotics for 4 days now with no improvement of his symptoms.  Patient denies dysuria, frequency or urgency.  He does state that his urine has a different smell for the past 2 weeks. Past Medical History:  Diagnosis Date  . Anxiety    (f/b by psych)  . Hypertension   . Migraine   . Smoker    former    Patient Active Problem List   Diagnosis Date Noted  . Hypertension 08/09/2012  . Migraine     Past Surgical History:  Procedure Laterality Date  . INCISION AND DRAINAGE ABSCESS ANAL  04/10/2011   Dr Daphine DeutscherMartin  . INCISION AND DRAINAGE ABSCESS ANAL  May 2013   Dr Magnus IvanBlackman  . INCISION AND DRAINAGE ABSCESS ANAL  09/15/2012   Dr Biagio QuintLayton  . LAPAROSCOPIC APPENDECTOMY N/A 09/08/2015   Procedure: APPENDECTOMY LAPAROSCOPIC;  Surgeon: Chevis PrettyPaul Toth III, MD;  Location: [facility];  Service: General;  Laterality: N/A;       Home Medications    Prior to Admission medications   Medication Sig Start Date End Date Taking? Authorizing Provider  ARIPiprazole (ABILIFY) 10 MG tablet Take 10 mg by mouth at bedtime.    Yes [provider]  ciprofloxacin (CIPRO) 500 MG tablet Take 500 mg by mouth 2 (two) times daily. Started 12/03 for 10 days   Yes [provider]    divalproex (DEPAKOTE) 250 MG 24 hr tablet Take 250 mg by mouth at bedtime.    Yes [provider]  metroNIDAZOLE (FLAGYL) 500 MG tablet Take 500 mg by mouth 3 (three) times daily. Started 12/03 for 10 days   Yes [provider]  lisinopril-hydrochlorothiazide (PRINZIDE,ZESTORETIC) 10-12.5 MG tablet TAKE ONE TABLET BY MOUTH ONE TIME DAILY **NEW INS CARD?? Patient not taking: Reported on 08/26/2017 12/09/15   Ronnald NianLalonde, John C, MD  ondansetron (ZOFRAN) 4 MG tablet Take 1 tablet (4 mg total) by mouth every 8 (eight) hours as needed for nausea or vomiting. 08/26/17   Loren RacerYelverton, Genni Buske, MD    Family History Family History  Problem Relation Age of Onset  . COPD Father   . Heart disease Sister        congenital    Social History Social History   Tobacco Use  . Smoking status: Former Smoker    Packs/day: 1.00    Types: Cigarettes    Last attempt to quit: 03/29/2011    Years since quitting: 6.4  . Smokeless tobacco: Never Used  Substance Use Topics  . Alcohol use: No  . Drug use: No     Allergies   Effexor [venlafaxine hydrochloride]   Review of Systems Review of Systems  Constitutional: Positive for diaphoresis. Negative for chills and fever.  Respiratory: Negative for cough and shortness of breath.   Cardiovascular: Negative for chest pain, palpitations and leg swelling.  Gastrointestinal: Positive for abdominal pain, blood in stool and nausea. Negative for constipation, diarrhea and vomiting.  Genitourinary: Negative for dysuria, flank pain, frequency and hematuria.  Musculoskeletal: Positive for back pain. Negative for neck pain.  Skin: Negative for rash and wound.  Neurological: Negative for dizziness, weakness, light-headedness, numbness and headaches.  All other systems reviewed and are negative.    Physical Exam Updated Vital Signs BP (!) 148/101   Pulse 77   Temp 98.2 F (36.8 C) (Oral)   Resp 12   Ht 6\' 1"  (1.854 m)   Wt 113.4 kg (250 lb)   SpO2  100%   BMI 32.98 kg/m   Physical Exam  Constitutional: He is oriented to person, place, and time. He appears well-developed and well-nourished.  HENT:  Head: Normocephalic and atraumatic.  Mouth/Throat: Oropharynx is clear and moist.  Eyes: EOM are normal. Pupils are equal, round, and reactive to light.  Neck: Normal range of motion. Neck supple.  Cardiovascular: Normal rate and regular rhythm.  Pulmonary/Chest: Effort normal and breath sounds normal.  Abdominal: Soft. There is tenderness. There is rebound. There is no guarding.  Patient with left lower hydrant tenderness to palpation with rebound tenderness as well.  Mild right lower quadrant tenderness with deep palpation.  Diminished bowel sounds throughout.  Musculoskeletal: Normal range of motion. He exhibits no edema or tenderness.  No CVA tenderness bilaterally.  No lower extremity swelling or asymmetry.  Neurological: He is alert and oriented to person, place, and time.  Moving all extremities without focal deficit.  Sensation fully intact.  Skin: Skin is warm and dry. Capillary refill takes less than 2 seconds. No rash noted. No erythema.  Psychiatric: He has a normal mood and affect. His behavior is normal.  Nursing note and vitals reviewed.    ED Treatments / Results  Labs (all labs ordered are listed, but only abnormal results are displayed) Labs Reviewed  COMPREHENSIVE METABOLIC PANEL - Abnormal; Notable for the following components:      Result Value   Glucose, Bld 108 (*)    AST 56 (*)    ALT 75 (*)    All other components within normal limits  CBC WITH DIFFERENTIAL/PLATELET  LIPASE, BLOOD  URINALYSIS, ROUTINE W REFLEX MICROSCOPIC    EKG  EKG Interpretation None       Radiology Ct Abdomen Pelvis W Contrast  Result Date: 08/26/2017 CLINICAL DATA:  Left-sided abdominal pain. EXAM: CT ABDOMEN AND PELVIS WITH CONTRAST TECHNIQUE: Multidetector CT imaging of the abdomen and pelvis was performed using the  standard protocol following bolus administration of intravenous contrast. CONTRAST:  100mL ISOVUE-300 IOPAMIDOL (ISOVUE-300) INJECTION 61% COMPARISON:  CT scan of September 07, 2015. FINDINGS: Lower chest: No acute abnormality. Hepatobiliary: No gallstones are noted. Fatty infiltration of the liver is noted. Pancreas: Unremarkable. No pancreatic ductal dilatation or surrounding inflammatory changes. Spleen: Normal in size without focal abnormality. Adrenals/Urinary Tract: Adrenal glands are unremarkable. Kidneys are normal, without renal calculi, focal lesion, or hydronephrosis. Bladder is unremarkable. Stomach/Bowel: The stomach appears normal. There is no evidence of bowel obstruction or inflammation. Status post appendectomy. Vascular/Lymphatic: No significant vascular findings are present. No enlarged abdominal or pelvic lymph nodes. Reproductive: Prostate is unremarkable. Other: No abdominal wall hernia or abnormality. No abdominopelvic ascites. Musculoskeletal: No acute or significant osseous findings. IMPRESSION: Fatty infiltration of the liver. No other abnormality seen in the  abdomen or pelvis. Electronically Signed   By: Lupita Raider, M.D.   On: 08/26/2017 10:30    Procedures Procedures (including critical care time)  Medications Ordered in ED Medications  iopamidol (ISOVUE-300) 61 % injection (not administered)  sodium chloride 0.9 % bolus 1,000 mL (0 mLs Intravenous Stopped 08/26/17 1002)  iopamidol (ISOVUE-300) 61 % injection 100 mL (100 mLs Intravenous Contrast Given 08/26/17 1011)     Initial Impression / Assessment and Plan / ED Course  I have reviewed the triage vital signs and the nursing notes.  Pertinent labs & imaging results that were available during my care of the patient were reviewed by me and considered in my medical decision making (see chart for details).     CT abdomen is normal.  Normal white blood cell count.  Suspect patient did have diverticulitis and is  significantly improving on antibiotics.  Advised him to continue the antibiotics as prescribed.  He does need to follow-up with a gastroenterologist.  He has been given return precautions and has voiced understanding.  Final Clinical Impressions(s) / ED Diagnoses   Final diagnoses:  Abdominal pain, acute, left lower quadrant    ED Discharge Orders        Ordered    ondansetron (ZOFRAN) 4 MG tablet  Every 8 hours PRN     08/26/17 1109       Loren Racer, MD 08/26/17 1112

## 2017-08-26 NOTE — Discharge Instructions (Signed)
Continue antibiotics as previously prescribed.  You may take Tylenol for pain.  Follow-up with the gastroenterologist.

## 2017-09-06 IMAGING — CR DG CHEST 2V
2 series · 2 of 2 positions shown · non-contrast
Comparison: 09/29/2008

CLINICAL DATA: Former smoker.  Dyspnea on exertion.

EXAM:
CHEST  2 VIEW

[view not recorded (1 of 2)]
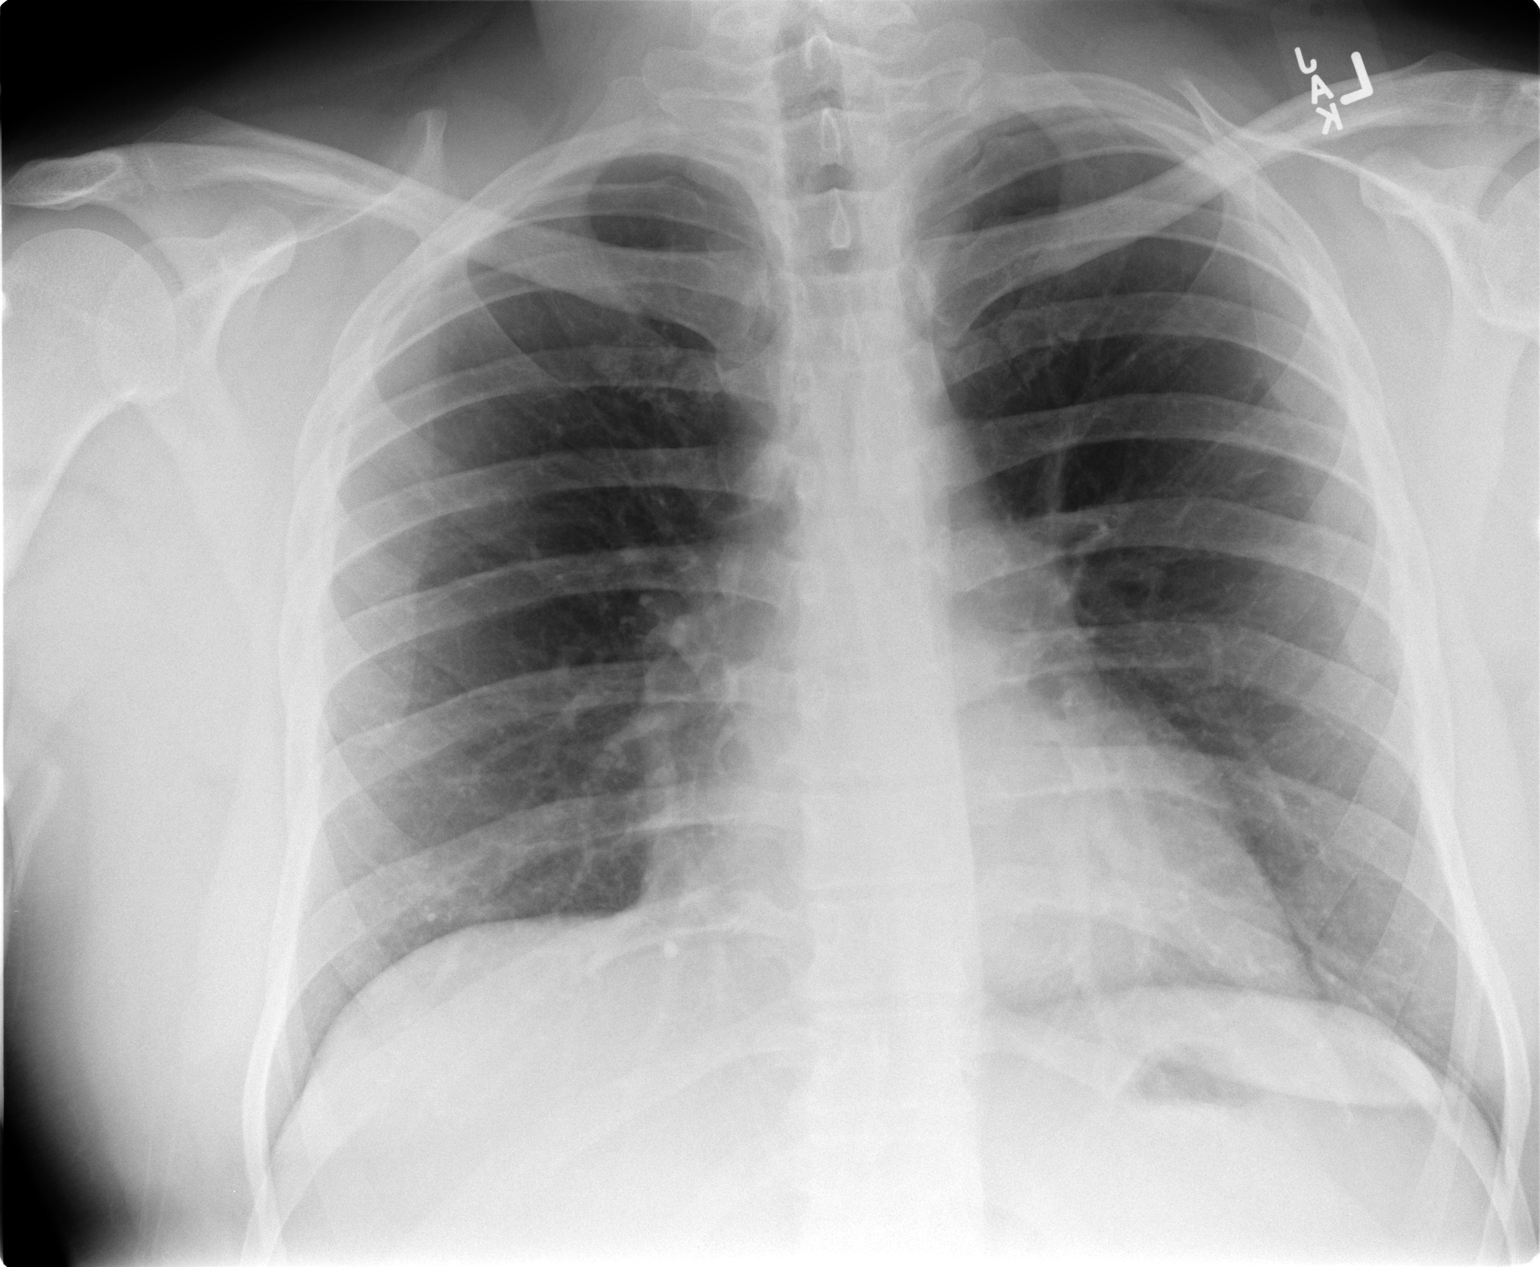

[view not recorded (2 of 2)]
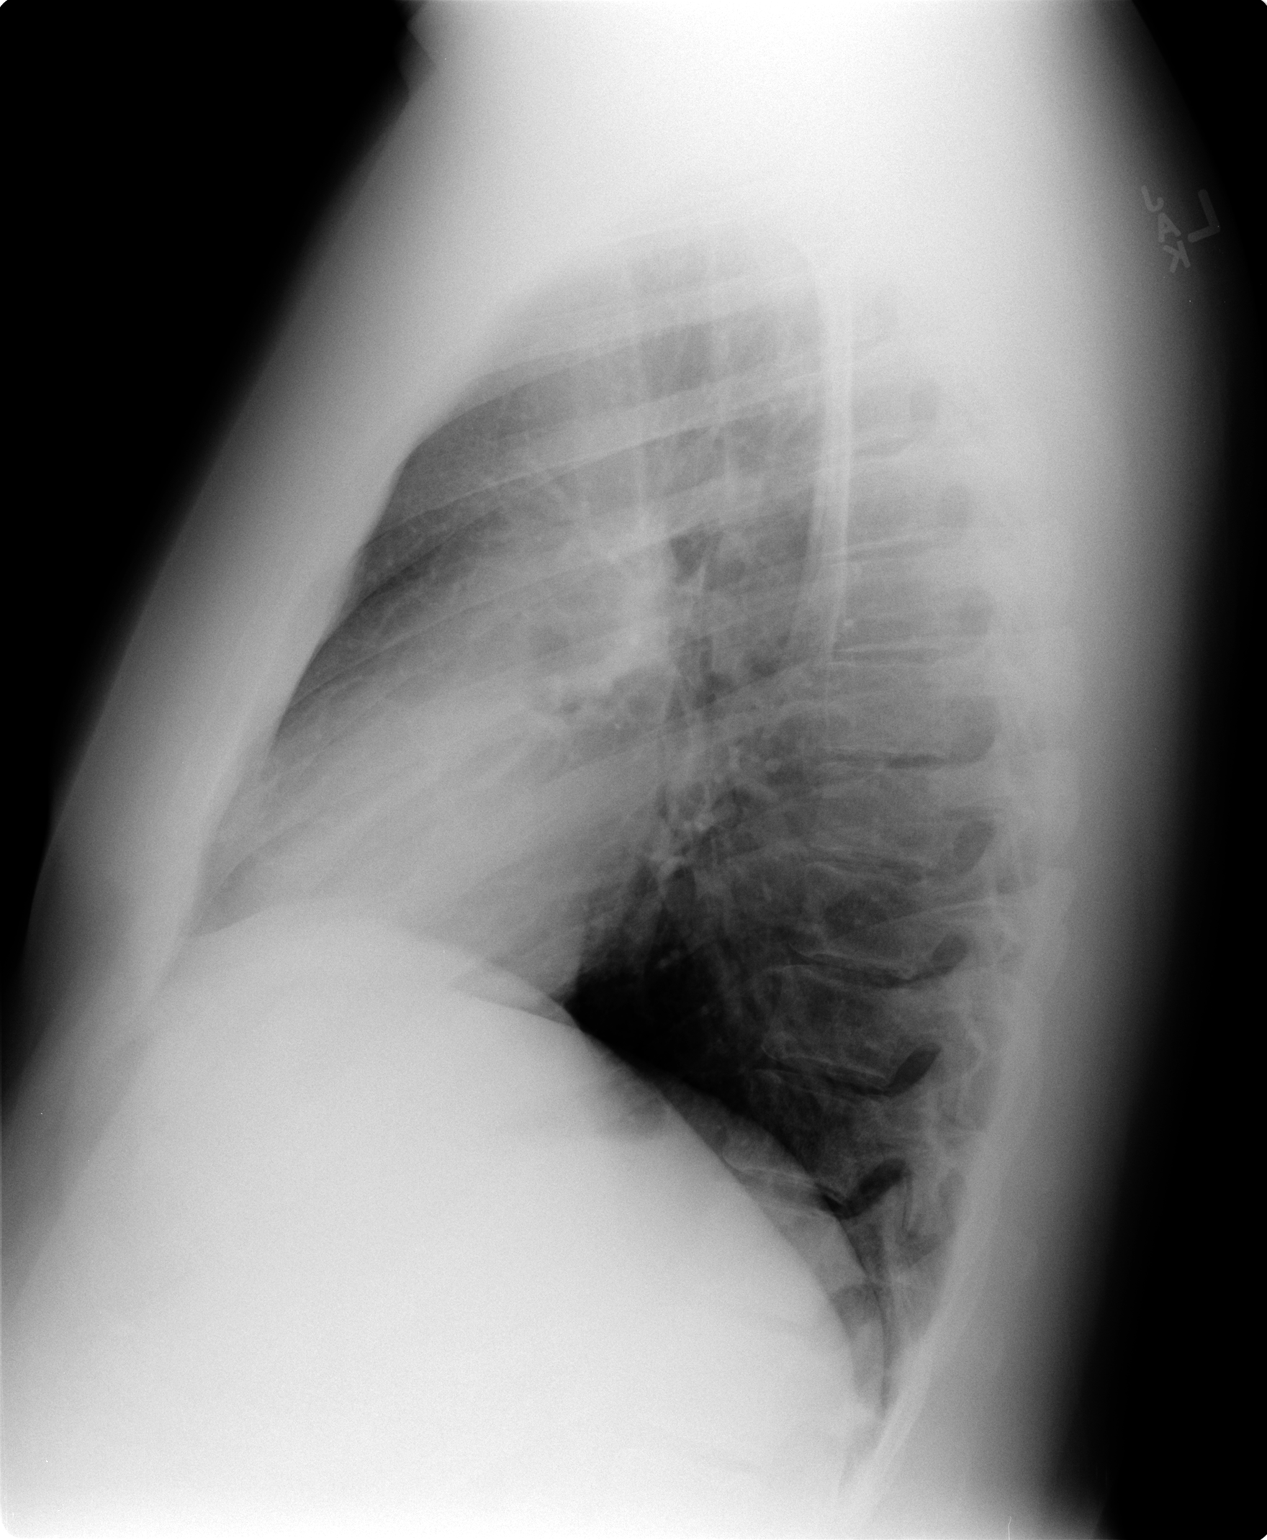

[2 of 2 positions shown; findings below may reference images not displayed]

FINDINGS: The heart size and mediastinal contours are within normal limits.
Both lungs are clear. The visualized skeletal structures are
unremarkable.
IMPRESSION: No active cardiopulmonary disease.

## 2018-04-04 ENCOUNTER — Other Ambulatory Visit: Payer: Self-pay | Admitting: Internal Medicine

## 2018-04-04 DIAGNOSIS — H53459 Other localized visual field defect, unspecified eye: Secondary | ICD-10-CM

## 2018-04-07 ENCOUNTER — Other Ambulatory Visit: Payer: Self-pay

## 2018-04-10 ENCOUNTER — Ambulatory Visit
Admission: RE | Admit: 2018-04-10 | Discharge: 2018-04-10 | Disposition: A | Discharge status: Home / Self Care | Payer: Self-pay | Source: Ambulatory Visit | Attending: Internal Medicine | Admitting: Internal Medicine

## 2018-04-10 DIAGNOSIS — H53459 Other localized visual field defect, unspecified eye: Secondary | ICD-10-CM

## 2018-04-10 MED ORDER — GADOBENATE DIMEGLUMINE 529 MG/ML IV SOLN
20.0000 mL | Freq: Once | INTRAVENOUS | Status: AC | PRN
  Administered 2018-04-10: 20 mL via INTRAVENOUS

## 2018-04-29 ENCOUNTER — Emergency Department (HOSPITAL_COMMUNITY): Payer: Self-pay

## 2018-04-29 ENCOUNTER — Emergency Department (HOSPITAL_COMMUNITY)
Admission: EM | Admit: 2018-04-29 | Discharge: 2018-04-29 | Disposition: A | Discharge status: Home / Self Care | Payer: Self-pay | Attending: Emergency Medicine | Admitting: Emergency Medicine

## 2018-04-29 ENCOUNTER — Encounter (HOSPITAL_COMMUNITY): Payer: Self-pay | Admitting: Emergency Medicine

## 2018-04-29 DIAGNOSIS — K610 Anal abscess: Secondary | ICD-10-CM | POA: Insufficient documentation

## 2018-04-29 DIAGNOSIS — I10 Essential (primary) hypertension: Secondary | ICD-10-CM | POA: Insufficient documentation

## 2018-04-29 DIAGNOSIS — Z87891 Personal history of nicotine dependence: Secondary | ICD-10-CM | POA: Insufficient documentation

## 2018-04-29 DIAGNOSIS — Z79899 Other long term (current) drug therapy: Secondary | ICD-10-CM | POA: Insufficient documentation

## 2018-04-29 LAB — BASIC METABOLIC PANEL
ANION GAP: 11 (ref 5–15)
BUN: 15 mg/dL (ref 6–20)
CHLORIDE: 103 mmol/L (ref 98–111)
CO2: 25 mmol/L (ref 22–32)
Calcium: 9.2 mg/dL (ref 8.9–10.3)
Creatinine, Ser: 1.18 mg/dL (ref 0.61–1.24)
GFR calc Af Amer: >60 mL/min (ref >60)
GFR calc non Af Amer: >60 mL/min (ref >60)
Glucose, Bld: 110 mg/dL — ABNORMAL HIGH (ref 70–99)
POTASSIUM: 4.2 mmol/L (ref 3.5–5.1)
SODIUM: 139 mmol/L (ref 135–145)

## 2018-04-29 LAB — CBC
HCT: 44.3 % (ref 39.0–52.0)
HEMOGLOBIN: 14.5 g/dL (ref 13.0–17.0)
MCH: 30.7 pg (ref 26.0–34.0)
MCHC: 32.7 g/dL (ref 30.0–36.0)
MCV: 93.9 fL (ref 78.0–100.0)
Platelets: 242 10*3/uL (ref 150–400)
RBC: 4.72 MIL/uL (ref 4.22–5.81)
RDW: 12.2 % (ref 11.5–15.5)
WBC: 12.7 10*3/uL — AB (ref 4.0–10.5)

## 2018-04-29 MED ORDER — LIDOCAINE-EPINEPHRINE-TETRACAINE (LET) SOLUTION
3.0000 mL | Freq: Once | NASAL | Status: DC
  Filled 2018-04-29: qty 3

## 2018-04-29 MED ORDER — MORPHINE SULFATE (PF) 4 MG/ML IV SOLN
6.0000 mg | Freq: Once | INTRAVENOUS | Status: AC
  Administered 2018-04-29: 6 mg via INTRAVENOUS
  Filled 2018-04-29: qty 2

## 2018-04-29 MED ORDER — LIDOCAINE-EPINEPHRINE (PF) 2 %-1:200000 IJ SOLN
20.0000 mL | Freq: Once | INTRAMUSCULAR | Status: AC
  Administered 2018-04-29: 20 mL via INTRADERMAL
  Filled 2018-04-29: qty 20

## 2018-04-29 MED ORDER — AMOXICILLIN-POT CLAVULANATE 875-125 MG PO TABS: 1.0000 | ORAL_TABLET | Freq: Two times a day (BID) | ORAL | 0 refills | Status: AC

## 2018-04-29 MED ORDER — IOHEXOL 300 MG/ML  SOLN
100.0000 mL | Freq: Once | INTRAMUSCULAR | Status: AC | PRN
  Administered 2018-04-29: 100 mL via INTRAVENOUS

## 2018-04-29 NOTE — ED Notes (Signed)
Patient transported to CT 

## 2018-04-29 NOTE — H&P (Signed)
[facility] Surgery Consult Note  Roberto Lewis. 13-Jan-1981  [id number].    Requesting MD: Alvino Chapel  Chief Complaint/Reason for Consult: perianal abscess HPI:  Patient is a 37 year old male with hx of recurrent perirectal abscesses. Presented to [facility] with 3-4 day history of perirectal pain and swelling. No drainage from area. Last time he had area lanced was in 2018. Patient reports associated chills but denies fever, chest pain, SOB, urinary symptoms, diarrhea. Does report occasional bleeding with stools when he has been constipated. No significant PMH. Allergic to effexor.   ROS: Review of Systems  Constitutional: Positive for chills. Negative for fever.  Respiratory: Negative for shortness of breath.   Cardiovascular: Negative for chest pain.  Gastrointestinal: Positive for blood in stool (occasional ). Negative for abdominal pain, diarrhea, nausea and vomiting.  Genitourinary: Negative for dysuria, frequency and urgency.  All other systems reviewed and are negative.   Family History  Problem Relation Age of Onset  . COPD Father   . Heart disease Sister        congenital    Past Medical History:  Diagnosis Date  . Anxiety    (f/b by psych)  . Hypertension   . Migraine   . Smoker    former    Past Surgical History:  Procedure Laterality Date  . INCISION AND DRAINAGE ABSCESS ANAL  04/10/2011   Dr Hassell Done  . INCISION AND DRAINAGE ABSCESS ANAL  May 2013   Dr Ninfa Linden  . INCISION AND DRAINAGE ABSCESS ANAL  09/15/2012   Dr Lilyan Punt  . LAPAROSCOPIC APPENDECTOMY N/A 09/08/2015   Procedure: APPENDECTOMY LAPAROSCOPIC;  Surgeon: Autumn Messing III, MD;  Location: [facility];  Service: General;  Laterality: N/A;    Social History:  reports that he quit smoking about 7 years ago. His smoking use included cigarettes. He smoked 1.00 pack per day. He has never used smokeless tobacco. He reports that he does not drink alcohol or use drugs.  Allergies:  Allergies  Allergen  Reactions  . Effexor [Venlafaxine Hydrochloride] Swelling and Other (See Comments)    Throat swelling, blurred vision     (Not in a hospital admission)  Blood pressure 115/67, pulse 78, temperature 98.5 F (36.9 C), temperature source Oral, resp. rate 16, SpO2 100 %. Physical Exam: Physical Exam  Constitutional: He is oriented to person, place, and time. He appears well-developed and well-nourished. He is cooperative.  Non-toxic appearance. No distress.  HENT:  Head: Normocephalic and atraumatic.  Right Ear: External ear normal.  Left Ear: External ear normal.  Nose: Nose normal.  Mouth/Throat: Mucous membranes are normal.  Eyes: Pupils are equal, round, and reactive to light. Conjunctivae, EOM and lids are normal. No scleral icterus.  Neck: Normal range of motion and phonation normal. Neck supple.  Cardiovascular: Normal rate and regular rhythm.  Pulses:      Radial pulses are 2+ on the right side, and 2+ on the left side.       Dorsalis pedis pulses are 2+ on the right side, and 2+ on the left side.  Pulmonary/Chest: Effort normal and breath sounds normal.  Abdominal: Soft. Bowel sounds are normal. He exhibits no distension. There is no tenderness.  Genitourinary:  Genitourinary Comments: L buttock with area of erythema and induration medially at gluteal cleft. No drainage. TTP  Musculoskeletal:  ROM grossly intact in bilateral upper and lower extremities  Neurological: He is alert and oriented to person, place, and time.  Skin: Skin is warm, dry and  intact.  Psychiatric: He has a normal mood and affect. His speech is normal and behavior is normal.    Results for orders placed or performed during the hospital encounter of 04/29/18 (from the past 48 hour(s))  CBC     Status: Abnormal   Collection Time: 04/29/18  6:09 AM  Result Value Ref Range   WBC 12.7 (H) 4.0 - 10.5 K/uL   RBC 4.72 4.22 - 5.81 MIL/uL   Hemoglobin 14.5 13.0 - 17.0 g/dL   HCT 44.3 39.0 - 52.0 %   MCV  93.9 78.0 - 100.0 fL   MCH 30.7 26.0 - 34.0 pg   MCHC 32.7 30.0 - 36.0 g/dL   RDW 12.2 11.5 - 15.5 %   Platelets 242 150 - 400 K/uL    Comment: Performed at [facility] [address], [place], [place] [address]  Basic metabolic panel     Status: Abnormal   Collection Time: 04/29/18  6:09 AM  Result Value Ref Range   Sodium 139 135 - 145 mmol/L   Potassium 4.2 3.5 - 5.1 mmol/L   Chloride 103 98 - 111 mmol/L   CO2 25 22 - 32 mmol/L   Glucose, Bld 110 (H) 70 - 99 mg/dL   BUN 15 6 - 20 mg/dL   Creatinine, Ser 1.18 0.61 - 1.24 mg/dL   Calcium 9.2 8.9 - 10.3 mg/dL   GFR calc non Af Amer >60 >60 mL/min   GFR calc Af Amer >60 >60 mL/min    Comment: (NOTE) The eGFR has been calculated using the CKD EPI equation. This calculation has not been validated in all clinical situations. eGFR's persistently <60 mL/min signify possible Chronic Kidney Disease.    Anion gap 11 5 - 15    Comment: Performed at [facility] [address] [place], [place] [address]   Ct Pelvis W Contrast  Result Date: 04/29/2018 CLINICAL DATA:  Anorectal abscess flare up EXAM: CT PELVIS WITH CONTRAST TECHNIQUE: Multidetector CT imaging of the pelvis was performed using the standard protocol following the bolus administration of intravenous contrast. CONTRAST:  176m OMNIPAQUE IOHEXOL 300 MG/ML SOLN IV. No oral contrast. COMPARISON:  08/26/2017 FINDINGS: Urinary Tract:  Bladder and distal ureters normal appearance Bowel:  Prior appendectomy.  Visualized large bowel loops normal. Vascular/Lymphatic: Few normal sized pelvic lymph nodes. No adenopathy. Reproductive:  Unremarkable prostate gland Other: No free intraperitoneal air or fluid. Mild infiltrative changes are seen inferior to the LEFT perianal region extending into the medial LEFT buttock crease where a small fluid collection 18 x 8 x 24 mm is identified compatible with abscess. No hernia. Musculoskeletal: Unremarkable IMPRESSION: LEFT inferior perianal  inflammatory changes with a small perianal abscess extending into the medial LEFT buttock crease 18 x 8 x 24 mm in size. Electronically Signed   By: MLavonia DanaM.D.   On: 04/29/2018 09:25   Incision and Drainage Procedure Note  Pre-operative Diagnosis: perirectal abscess  Post-operative Diagnosis: same  Indications: perirectal abscess seen on CT  Anesthesia: 2% lidocaine with epinephrine  Procedure Details  The procedure, risks and complications have been discussed in detail (including, but not limited to airway compromise, infection, bleeding) with the patient, and the patient has signed consent to the procedure.  The skin was sterilely prepped and draped over the affected area in the usual fashion. After adequate local anesthesia, I&D with an 18G needle and #11 blade was performed on the left medial buttock. Purulent drainage: present. Loculations broken  up. Wound packed with 1/4" iodoform and covered with dry dressing.  The patient was observed until stable.  Findings: Purulent drainage on aspirations, loculated abscess cavity  EBL: 5 cc's  Drains: none  Condition: Tolerated procedure well and Stable   Complications: none.    Assessment/Plan Perirectal abcess - s/p I&D today at the bedside - BID dressing changes with iodoform packing  - tylenol and ibuprofen for pain control - 1 week augmentin  - sitz baths - f/u in [facility] office - discharge home from ED  Brigid Re, [facility] Surgery 04/29/2018, 10:31 AM Pager: [phone] Consults: [phone] Mon-Fri 7:00 am-4:30 pm Sat-Sun 7:00 am-11:30 am

## 2018-04-29 NOTE — ED Notes (Signed)
Discharge instructions and prescriptions discussed with Pt. Pt verbalized understanding. Pt stable and ambulatory.   

## 2018-04-29 NOTE — Discharge Instructions (Signed)
How to Take a Sitz Bath A sitz bath is a warm water bath that is taken while you are sitting down. The water should only come up to your hips and should cover your buttocks. Your health care provider may recommend a sitz bath to help you:  Clean the lower part of your body, including your genital area.  With itching.  With pain.  With sore muscles or muscles that tighten or spasm.  How to take a sitz bath Take 3-4 sitz baths per day or as told by your health care provider. 1. Partially fill a bathtub with warm water. You will only need the water to be deep enough to cover your hips and buttocks when you are sitting in it. 2. If your health care provider told you to put medicine in the water, follow the directions exactly. 3. Sit in the water and open the tub drain a little. 4. Turn on the warm water again to keep the tub at the correct level. Keep the water running constantly. 5. Soak in the water for 15-20 minutes or as told by your health care provider. 6. After the sitz bath, pat the affected area dry first. Do not rub it. 7. Be careful when you stand up after the sitz bath because you may feel dizzy.  Contact a health care provider if:  Your symptoms get worse. Do not continue with sitz baths if your symptoms get worse.  You have new symptoms. Do not continue with sitz baths until you talk with your health care provider. This information is not intended to replace advice given to you by your health care provider. Make sure you discuss any questions you have with your health care provider. Document Released: 05/30/2004 Document Revised: 02/05/2016 Document Reviewed: 09/05/2014 Elsevier Interactive Patient Education  2018 [employer]  Perirectal Abscess An abscess is an infected area that contains a collection of pus. A perirectal abscess is an abscess that is near the opening of the anus or around the rectum. A perirectal abscess can cause a lot of pain, especially during bowel  movements. What are the causes? This condition is almost always caused by an infection that starts in an anal gland. What increases the risk? This condition is more likely to develop in:  People with diabetes or inflammatory bowel disease.  People whose body defense system (immune system) is weak.  People who have anal sex.  People who have a sexually transmitted disease (STD).  People who have certain kinds of cancers, such as rectal carcinoma, leukemia, or lymphoma.  What are the signs or symptoms? The main symptom of this condition is pain. The pain may be a throbbing pain that gets worse during bowel movements. Other symptoms include:  Fever.  Swelling.  Redness.  Bleeding.  Constipation.  How is this diagnosed? The condition is diagnosed with a physical exam. If the abscess is not visible, a health care provider may need to place a finger inside the rectum to find the abscess. Sometimes, imaging tests are done to determine the size and location of the abscess. These tests may include:  An ultrasound.  An MRI.  A CT scan.  How is this treated? This condition is usually treated with incision and drainage surgery. Incision and drainage surgery involves making an incision over the abscess to drain the pus. Treatment may also involve antibiotic medicine, pain medicine, stool softeners, or laxatives. Follow these instructions at home:  Take medicines only as directed by your health care  provider.  If you were prescribed an antibiotic, finish all of it even if you start to feel better.  To relieve pain, try sitting: ? In a warm, shallow bath (sitz bath). ? On a heating pad with the setting on low. ? On an inflatable donut-shaped cushion.  Follow any diet instructions as directed by your health care provider.  Keep all follow-up visits as directed by your health care provider. This is important. Contact a health care provider if:  Your abscess is bleeding.  You  have pain, swelling, or redness that is getting worse.  You are constipated.  You feel ill.  You have muscle aches or chills.  You have a fever.  Your symptoms return after the abscess has healed. This information is not intended to replace advice given to you by your health care provider. Make sure you discuss any questions you have with your health care provider. Document Released: 09/04/2000 Document Revised: 02/13/2016 Document Reviewed: 07/18/2014 Elsevier Interactive Patient Education  2018 [employer].

## 2018-04-29 NOTE — ED Provider Notes (Signed)
  Physical Exam  BP 127/75   Pulse (!) 102   Temp 98.5 F (36.9 C) (Oral)   Resp 16   SpO2 99%   Physical Exam  ED Course/Procedures     Procedures  MDM  Patient with perianal abscess.  I was not able to clearly palpate where the fluctuance was.  Consult to general surgery, who drained in the ER.  We will follow-up as an outpatient.  Antibiotics E prescribed by general surgery.       Benjiman CorePickering, Jeananne Bedwell, MD 04/29/18 1239

## 2018-04-29 NOTE — ED Provider Notes (Signed)
[facility] EMERGENCY DEPARTMENT Provider Note   CSN: [id number] Arrival date & time: 04/29/18  0440     History   Chief Complaint Chief Complaint  Patient presents with  . Abscess    HPI Ngoc Daughtridge. is a 37 y.o. male.  HPI Patient is a 37 year old male who has a history of peri-anal abscess reports recurrent increasing pain over the past 24 hours.  He states that pain and pressure is unbearable to this point.  He has pain with sitting.  No fevers or chills.  He has been referred to surgery but states he was unable to afford the surgical procedure.  Symptoms are moderate to severe in severity.  No other complaints at this time   Past Medical History:  Diagnosis Date  . Anxiety    (f/b by psych)  . Hypertension   . Migraine   . Smoker    former    Patient Active Problem List   Diagnosis Date Noted  . Hypertension 08/09/2012  . Migraine     Past Surgical History:  Procedure Laterality Date  . INCISION AND DRAINAGE ABSCESS ANAL  04/10/2011   Dr Daphine Deutscher  . INCISION AND DRAINAGE ABSCESS ANAL  May 2013   Dr Magnus Ivan  . INCISION AND DRAINAGE ABSCESS ANAL  09/15/2012   Dr Biagio Quint  . LAPAROSCOPIC APPENDECTOMY N/A 09/08/2015   Procedure: APPENDECTOMY LAPAROSCOPIC;  Surgeon: Chevis Pretty III, MD;  Location: [facility];  Service: General;  Laterality: N/A;        Home Medications    Prior to Admission medications   Medication Sig Start Date End Date Taking? Authorizing Provider  ARIPiprazole (ABILIFY) 10 MG tablet Take 10 mg by mouth at bedtime.    Yes [provider]  divalproex (DEPAKOTE) 250 MG 24 hr tablet Take 250 mg by mouth at bedtime.    Yes [provider]  lisinopril-hydrochlorothiazide (PRINZIDE,ZESTORETIC) 10-12.5 MG tablet TAKE ONE TABLET BY MOUTH ONE TIME DAILY **NEW INS CARD?? Patient not taking: Reported on 08/26/2017 12/09/15   Ronnald Nian, MD  ondansetron (ZOFRAN) 4 MG tablet Take 1 tablet (4 mg total) by mouth  every 8 (eight) hours as needed for nausea or vomiting. Patient not taking: Reported on 04/29/2018 08/26/17   Loren Racer, MD    Family History Family History  Problem Relation Age of Onset  . COPD Father   . Heart disease Sister        congenital    Social History Social History   Tobacco Use  . Smoking status: Former Smoker    Packs/day: 1.00    Types: Cigarettes    Last attempt to quit: 03/29/2011    Years since quitting: 7.0  . Smokeless tobacco: Never Used  Substance Use Topics  . Alcohol use: No  . Drug use: No     Allergies   Effexor [venlafaxine hydrochloride]   Review of Systems Review of Systems  All other systems reviewed and are negative.    Physical Exam Updated Vital Signs BP 117/78   Pulse 72   Temp 98.5 F (36.9 C) (Oral)   Resp 16   SpO2 98%   Physical Exam  Constitutional: He is oriented to person, place, and time. He appears well-developed and well-nourished.  HENT:  Head: Normocephalic and atraumatic.  Eyes: EOM are normal.  Neck: Normal range of motion.  Cardiovascular: Normal rate and regular rhythm.  Pulmonary/Chest: Effort normal and breath sounds normal.  Abdominal: Soft. He exhibits no distension.  Genitourinary:  Genitourinary Comments: Mild fullness without obvious palpable abscess of the right perianal space.  No anal fissures noted.  No anal or gluteal cellulitis present.  Perineum is soft  Musculoskeletal: Normal range of motion.  Neurological: He is alert and oriented to person, place, and time.  Skin: Skin is warm and dry.  Psychiatric: He has a normal mood and affect. Judgment normal.  Nursing note and vitals reviewed.    ED Treatments / Results  Labs (all labs ordered are listed, but only abnormal results are displayed) Labs Reviewed  CBC - Abnormal; Notable for the following components:      Result Value   WBC 12.7 (*)    All other components within normal limits  BASIC METABOLIC PANEL - Abnormal; Notable  for the following components:   Glucose, Bld 110 (*)    All other components within normal limits    EKG None  Radiology No results found.  Procedures Procedures (including critical care time)  Medications Ordered in ED Medications  morphine 4 MG/ML injection 6 mg (6 mg Intravenous Given 04/29/18 0626)     Initial Impression / Assessment and Plan / ED Course  I have reviewed the triage vital signs and the nursing notes.  Pertinent labs & imaging results that were available during my care of the patient were reviewed by me and considered in my medical decision making (see chart for details).     Patient will undergo CT imaging to evaluate for perirectal abscess.  He does have some mild perianal fullness but there is no obvious focal area at this time that I would feel comfortable placing a blade into.  Given the degree of pain in the absence of significant findings on the skin surface he will undergo CT imaging.  Care to Dr Rubin PayorPickering  Final Clinical Impressions(s) / ED Diagnoses   Final diagnoses:  None    ED Discharge Orders    None       Azalia Bilisampos, Dovie Kapusta, MD 04/29/18 [phone]

## 2018-04-29 NOTE — ED Triage Notes (Signed)
Pt diagnoses w/ anal abscess, "it has flared up again".  Pt reports it has had to be drained several times over the past several years.  Pt states the pressure from the abscess is "unbearable."

## 2018-04-30 ENCOUNTER — Encounter (HOSPITAL_COMMUNITY): Payer: Self-pay | Admitting: Emergency Medicine

## 2018-04-30 ENCOUNTER — Emergency Department (HOSPITAL_COMMUNITY)
Admission: EM | Admit: 2018-04-30 | Discharge: 2018-04-30 | Disposition: A | Discharge status: Home / Self Care | Payer: Self-pay | Attending: Emergency Medicine | Admitting: Emergency Medicine

## 2018-04-30 ENCOUNTER — Other Ambulatory Visit: Payer: Self-pay

## 2018-04-30 DIAGNOSIS — R509 Fever, unspecified: Secondary | ICD-10-CM | POA: Insufficient documentation

## 2018-04-30 DIAGNOSIS — I10 Essential (primary) hypertension: Secondary | ICD-10-CM | POA: Insufficient documentation

## 2018-04-30 DIAGNOSIS — Z87891 Personal history of nicotine dependence: Secondary | ICD-10-CM | POA: Insufficient documentation

## 2018-04-30 DIAGNOSIS — K610 Anal abscess: Secondary | ICD-10-CM | POA: Insufficient documentation

## 2018-04-30 NOTE — ED Provider Notes (Signed)
[facility]   CSN: [id number] Arrival date & time: 04/30/18  1553     History   Chief Complaint Chief Complaint  Patient presents with  . Abscess    HPI Roberto BowWilliam Spackman Jr. is a 37 y.o. male.  HPI  37 year old male with a known history of frequent perianal abscesses and fistula presents with concern for worsening infection.  [facility] and had a perianal abscess drained by general surgery.  He then went home and developed a fever of 102 with chills.  He filled his antibiotics after this and took his first dose of Augmentin.  This morning after having a bowel movement he soaked and washed in a bath due to the pain and remove the packing.  Developed another fever of 100 or 101 this morning but none since.  Has been having significant bloody and brown drainage into his underwear.  He is concerned that something may be wrong given the amount of drainage and he thought he pulled "meat" out of the wound.  Has been taking the Tylenol and ibuprofen.  Past Medical History:  Diagnosis Date  . Anxiety    (f/b by psych)  . Hypertension   . Migraine   . Smoker    former    Patient Active Problem List   Diagnosis Date Noted  . Hypertension 08/09/2012  . Migraine     Past Surgical History:  Procedure Laterality Date  . INCISION AND DRAINAGE ABSCESS ANAL  04/10/2011   Dr Daphine DeutscherMartin  . INCISION AND DRAINAGE ABSCESS ANAL  May 2013   Dr Magnus IvanBlackman  . INCISION AND DRAINAGE ABSCESS ANAL  09/15/2012   Dr Biagio QuintLayton  . LAPAROSCOPIC APPENDECTOMY N/A 09/08/2015   Procedure: APPENDECTOMY LAPAROSCOPIC;  Surgeon: Chevis PrettyPaul Toth III, MD;  Location: [facility];  Service: General;  Laterality: N/A;        Home Medications    Prior to Admission medications   Medication Sig Start Date End Date Taking? Authorizing Provider  amoxicillin-clavulanate (AUGMENTIN) 875-125 MG tablet Take 1 tablet by mouth every 12 (twelve)  hours for 7 days. 04/29/18 05/06/18 Yes Rayburn, Alphonsus SiasKelly A, PA-C  ARIPiprazole (ABILIFY) 10 MG tablet Take 10 mg by mouth at bedtime.    Yes [provider]  divalproex (DEPAKOTE) 250 MG 24 hr tablet Take 250 mg by mouth at bedtime.    Yes [provider]  lisinopril-hydrochlorothiazide (PRINZIDE,ZESTORETIC) 10-12.5 MG tablet TAKE ONE TABLET BY MOUTH ONE TIME DAILY **NEW INS CARD?? Patient not taking: Reported on 08/26/2017 12/09/15   Ronnald NianLalonde, John C, MD  ondansetron (ZOFRAN) 4 MG tablet Take 1 tablet (4 mg total) by mouth every 8 (eight) hours as needed for nausea or vomiting. Patient not taking: Reported on 04/29/2018 08/26/17   Loren RacerYelverton, David, MD    Family History Family History  Problem Relation Age of Onset  . COPD Father   . Heart disease Sister        congenital    Social History Social History   Tobacco Use  . Smoking status: Former Smoker    Packs/day: 1.00    Types: Cigarettes    Last attempt to quit: 03/29/2011    Years since quitting: 7.0  . Smokeless tobacco: Never Used  Substance Use Topics  . Alcohol use: No  . Drug use: No     Allergies   Effexor [venlafaxine hydrochloride]   Review of Systems Review of Systems  Constitutional: Positive for  chills and fever.  Skin: Positive for wound.     Physical Exam Updated Vital Signs BP (!) 164/102 (BP Location: Left Arm)   Pulse (!) 101   Temp 98.3 F (36.8 C) (Oral)   Resp 18   SpO2 96%   Physical Exam  Constitutional: He appears well-developed and well-nourished. No distress.  HENT:  Head: Normocephalic.  Nose: Nose normal.  Eyes: Right eye exhibits no discharge. Left eye exhibits no discharge.  Cardiovascular:  Heart rate 90s  Pulmonary/Chest: Effort normal.  Abdominal: He exhibits no distension.  Musculoskeletal: He exhibits no edema.  Neurological: He is alert.  Skin: Skin is warm and dry. He is not diaphoretic. There is erythema.  Small linear incision in the medial left buttocks.   There is minimal surrounding erythema and no current drainage.  There is some soft tissue found that appears to be either tissue paper versus possible leave the packing material.  However there is no "meat" or obvious dehiscence.  There is no obvious fluctuance or induration remaining.  Nursing note and vitals reviewed.    ED Treatments / Results  Labs (all labs ordered are listed, but only abnormal results are displayed) Labs Reviewed - No data to display  EKG None  Radiology Ct Pelvis W Contrast  Result Date: 04/29/2018 CLINICAL DATA:  Anorectal abscess flare up EXAM: CT PELVIS WITH CONTRAST TECHNIQUE: Multidetector CT imaging of the pelvis was performed using the standard protocol following the bolus administration of intravenous contrast. CONTRAST:  OMNIPAQUE IOHEXOL 300 MG/ML SOLN IV. No oral contrast. COMPARISON:  08/26/2017 FINDINGS: Urinary Tract:  Bladder and distal ureters normal appearance Bowel:  Prior appendectomy.  Visualized large bowel loops normal. Vascular/Lymphatic: Few normal sized pelvic lymph nodes. No adenopathy. Reproductive:  Unremarkable prostate gland Other: No free intraperitoneal air or fluid. Mild infiltrative changes are seen inferior to the LEFT perianal region extending into the medial LEFT buttock crease where a small fluid collection 18 x 8 x 24 mm is identified compatible with abscess. No hernia. Musculoskeletal: Unremarkable IMPRESSION: LEFT inferior perianal inflammatory changes with a small perianal abscess extending into the medial LEFT buttock crease 18 x 8 x 24 mm in size. Electronically Signed   By: Ulyses Southward M.D.   On: 04/29/2018 09:25    Procedures Procedures (including critical care time)  Medications Ordered in ED Medications - No data to display   Initial Impression / Assessment and Plan / ED Course  I have reviewed the triage vital signs and the nursing notes.  Pertinent labs & imaging results that were available during my care of  the patient were reviewed by me and considered in my medical decision making (see chart for details).     I do not see any obvious muscle or other acute abnormality related to his wound.  I do not feel a recurrent abscess and it appears that they had good drainage yesterday.  The fever is a little odd but it is still been just about 24 hours since the actual incision and drainage so I think fever is still okay in this time.[name]  However I have discussed that if he still has fever tomorrow or he develops any other new/concerning symptoms he should call central, surgery and/or come back to the ER.  Otherwise Augmentin appears to be a good choice given the location I have advised him to continue this.  Follow-up with [facility] on [facility]/15 as scheduled.  We discussed wound care.  Final Clinical Impressions(s) /  ED Diagnoses   Final diagnoses:  Perianal abscess    ED Discharge Orders    None       Pricilla Loveless, MD 04/30/18 1650

## 2018-04-30 NOTE — ED Triage Notes (Addendum)
Patient reports he had perianal abscess drained yesterday. C/o fever and chills since last night. Also reports bloody drainage from site today. Afebrile in triage. States last tylenol at 0830 today.

## 2018-04-30 NOTE — Discharge Instructions (Signed)
Call the [facility] [place] [facility] office or return to the emergency department if you develop worsening pain, fevers, vomiting, or any other new/concerning symptoms.  Otherwise take the antibiotics to completion, even if you are feeling better.

## 2018-05-01 LAB — AEROBIC CULTURE  (SUPERFICIAL SPECIMEN)

## 2018-05-02 ENCOUNTER — Telehealth: Payer: Self-pay | Admitting: Emergency Medicine

## 2018-05-02 NOTE — Telephone Encounter (Signed)
Post ED Visit - Positive Culture Follow-up  Culture report reviewed by antimicrobial stewardship pharmacist:  []  Enzo BiNathan Batchelder, Pharm.D. []  Celedonio MiyamotoJeremy Frens, Pharm.D., BCPS AQ-ID []  Garvin FilaMike Maccia, Pharm.D., BCPS []  Georgina PillionElizabeth Martin, Pharm.D., BCPS []  [place]armony[place]Minh Pham, [address]Pharm.D., BCPS, AAHIVP []  Estella HuskMichelle Turner, Pharm.D., BCPS, AAHIVP []  Lysle Pearlachel Rumbarger, PharmD, BCPS []  Phillips Climeshuy Dang, PharmD, BCPS []  Agapito GamesAlison Masters, PharmD, BCPS []  Verlan FriendsErin Deja, PharmD Wilhemina BonitoMelissa Love PharmD  Positive wound culture Treated with augmentin, organism sensitive to the same and no further patient follow-up is required at this time.  Berle MullMiller, Daryl Beehler 05/02/2018, 10:56 AM

## 2018-06-05 ENCOUNTER — Ambulatory Visit (HOSPITAL_COMMUNITY)
Admission: EM | Admit: 2018-06-05 | Discharge: 2018-06-05 | Disposition: A | Discharge status: Home / Self Care | Payer: Self-pay | Attending: Family Medicine | Admitting: Family Medicine

## 2018-06-05 ENCOUNTER — Ambulatory Visit (INDEPENDENT_AMBULATORY_CARE_PROVIDER_SITE_OTHER): Payer: Self-pay

## 2018-06-05 DIAGNOSIS — S63501A Unspecified sprain of right wrist, initial encounter: Secondary | ICD-10-CM

## 2018-06-05 MED ORDER — ACETAMINOPHEN-CODEINE #3 300-30 MG PO TABS: 1.0000 | ORAL_TABLET | Freq: Four times a day (QID) | ORAL | 0 refills | Status: DC | PRN

## 2018-06-05 MED ORDER — MELOXICAM 15 MG PO TABS: 15.0000 mg | ORAL_TABLET | Freq: Every day | ORAL | 1 refills | Status: DC

## 2018-06-05 NOTE — Discharge Instructions (Addendum)
Take all medication as prescribed. If no improvement, [facility].
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
## 2019-05-12 ENCOUNTER — Other Ambulatory Visit: Payer: Self-pay

## 2019-05-12 ENCOUNTER — Emergency Department (HOSPITAL_COMMUNITY): Payer: Self-pay

## 2019-05-12 ENCOUNTER — Encounter (HOSPITAL_COMMUNITY): Payer: Self-pay | Admitting: Emergency Medicine

## 2019-05-12 ENCOUNTER — Emergency Department (HOSPITAL_COMMUNITY)
Admission: EM | Admit: 2019-05-12 | Discharge: 2019-05-13 | Disposition: A | Discharge status: Home / Self Care | Payer: Self-pay | Attending: Emergency Medicine | Admitting: Emergency Medicine

## 2019-05-12 DIAGNOSIS — I10 Essential (primary) hypertension: Secondary | ICD-10-CM | POA: Insufficient documentation

## 2019-05-12 DIAGNOSIS — K610 Anal abscess: Secondary | ICD-10-CM | POA: Insufficient documentation

## 2019-05-12 DIAGNOSIS — Z87891 Personal history of nicotine dependence: Secondary | ICD-10-CM | POA: Insufficient documentation

## 2019-05-12 LAB — COMPREHENSIVE METABOLIC PANEL
ALT: 46 U/L — ABNORMAL HIGH (ref 0–44)
AST: 34 U/L (ref 15–41)
Albumin: 4.5 g/dL (ref 3.5–5.0)
Alkaline Phosphatase: 87 U/L (ref 38–126)
Anion gap: 12 (ref 5–15)
BUN: 18 mg/dL (ref 6–20)
CO2: 26 mmol/L (ref 22–32)
Calcium: 9.5 mg/dL (ref 8.9–10.3)
Chloride: 101 mmol/L (ref 98–111)
Creatinine, Ser: 1.17 mg/dL (ref 0.61–1.24)
GFR calc Af Amer: >60 mL/min (ref >60)
GFR calc non Af Amer: >60 mL/min (ref >60)
Glucose, Bld: 107 mg/dL — ABNORMAL HIGH (ref 70–99)
Potassium: 3.6 mmol/L (ref 3.5–5.1)
Sodium: 139 mmol/L (ref 135–145)
Total Bilirubin: 0.6 mg/dL (ref 0.3–1.2)
Total Protein: 8.2 g/dL — ABNORMAL HIGH (ref 6.5–8.1)

## 2019-05-12 LAB — LACTIC ACID, PLASMA: Lactic Acid, Venous: 1.3 mmol/L (ref 0.5–1.9)

## 2019-05-12 LAB — CBC WITH DIFFERENTIAL/PLATELET
Abs Immature Granulocytes: 0.08 10*3/uL — ABNORMAL HIGH (ref 0.00–0.07)
Basophils Absolute: 0.1 10*3/uL (ref 0.0–0.1)
Basophils Relative: 1 %
Eosinophils Absolute: 0.1 10*3/uL (ref 0.0–0.5)
Eosinophils Relative: 1 %
HCT: 46.9 % (ref 39.0–52.0)
Hemoglobin: 16.4 g/dL (ref 13.0–17.0)
Immature Granulocytes: 1 %
Lymphocytes Relative: 21 %
Lymphs Abs: 3.4 10*3/uL (ref 0.7–4.0)
MCH: 32.3 pg (ref 26.0–34.0)
MCHC: 35 g/dL (ref 30.0–36.0)
MCV: 92.3 fL (ref 80.0–100.0)
Monocytes Absolute: 1.4 10*3/uL — ABNORMAL HIGH (ref 0.1–1.0)
Monocytes Relative: 9 %
Neutro Abs: 11 10*3/uL — ABNORMAL HIGH (ref 1.7–7.7)
Neutrophils Relative %: 67 %
Platelets: 346 10*3/uL (ref 150–400)
RBC: 5.08 MIL/uL (ref 4.22–5.81)
RDW: 12.1 % (ref 11.5–15.5)
WBC: 16.1 10*3/uL — ABNORMAL HIGH (ref 4.0–10.5)
nRBC: 0 % (ref 0.0–0.2)

## 2019-05-12 LAB — URINALYSIS, ROUTINE W REFLEX MICROSCOPIC
Bacteria, UA: NONE SEEN
Bilirubin Urine: NEGATIVE
Glucose, UA: NEGATIVE mg/dL
Hgb urine dipstick: NEGATIVE
Ketones, ur: NEGATIVE mg/dL
Leukocytes,Ua: NEGATIVE
Nitrite: NEGATIVE
Protein, ur: 30 mg/dL — AB
Specific Gravity, Urine: 1.034 — ABNORMAL HIGH (ref 1.005–1.030)
pH: 6 (ref 5.0–8.0)

## 2019-05-12 MED ORDER — SODIUM CHLORIDE (PF) 0.9 % IJ SOLN
INTRAMUSCULAR | Status: AC
  Filled 2019-05-12: qty 50

## 2019-05-12 MED ORDER — METRONIDAZOLE IN NACL 5-0.79 MG/ML-% IV SOLN
500.0000 mg | Freq: Once | INTRAVENOUS | Status: AC
  Administered 2019-05-12: 500 mg via INTRAVENOUS
  Filled 2019-05-12: qty 100

## 2019-05-12 MED ORDER — IOHEXOL 300 MG/ML  SOLN
100.0000 mL | Freq: Once | INTRAMUSCULAR | Status: AC | PRN
  Administered 2019-05-12: 21:00:00 100 mL via INTRAVENOUS

## 2019-05-12 MED ORDER — SODIUM CHLORIDE 0.9 % IV SOLN
2.0000 g | Freq: Once | INTRAVENOUS | Status: AC
  Administered 2019-05-12: 22:00:00 2 g via INTRAVENOUS
  Filled 2019-05-12: qty 20

## 2019-05-12 MED ORDER — LIDOCAINE-EPINEPHRINE 2 %-1:100000 IJ SOLN
30.0000 mL | Freq: Once | INTRAMUSCULAR | Status: AC
  Administered 2019-05-12: 30 mL via INTRADERMAL
  Filled 2019-05-12: qty 2

## 2019-05-12 NOTE — ED Provider Notes (Signed)
Patient care signed out from Dr. Zenia Resides at end of shift.   He presents to ED with fever and rectal pain, found to have a perianal abscess.   Plan: surgery (Dr. Marcello Moores) to drain at bedside. Anticipate discharge home after procedure.   Dr. Marcello Moores has seen the patient and performed bedside procedure. No further instructions - patient to be discharged home with office follow up per Dr. Zenia Resides.   Antibiotics have been completed here. He can be discharged home per plan of previous treatment team.    Charlann Lange, PA-C 05/12/19 2359    Lacretia Leigh, MD 05/13/19 0800

## 2019-05-12 NOTE — Discharge Instructions (Addendum)
Home Instructions Following Incision and Drainage of Perirectal Abscess  Wound care - A dressing has been applied to control any bleeding or drainage immediately after your procedure.  You may remove this dressing at your first bowel movement or tomorrow morning, whichever comes first.  There may be packing inside your wound as well that should be removed with the dressing.  You do not need to repack the area.  After the dressing is removed, clean the area gently with a mild soap and warm water and place a piece of 100% cotton over the area.  Change to cotton ever 1-3 hours while awake to keep the area clean and dry.   - Beginning tomorrow, sit in a tub of warm water for 15-20 minutes at least twice a day and after bowel movements.  This will help with healing, pain and discomfort. - A small amount of bleeding is to be expected.  If you notice an increase in the bleeding, place a large piece of cotton (about the size of a golf ball) next to the anal opening and sit on a hard surface for 15 minutes.  If the bleeding persists or if you are concerned, please call the office.  Do not sit on rubber rings.  Instead, sit on a soft pillow.    Diet -Eat a regular diet.  Avoid foods that may constipate you or give you diarrhea.  Drink 6-8 glasses of water a day and avoid seeds, nuts and popcorn until the area heals.  Medication -Take pain medication as directed.  Do not drive or operate machinery if you are taking a prescription pain medication.   - We recommend Extra Strength Tylenol for mild to moderate pain.  This can be taken as instructed on the bottle.   - If you are given a prescription for antibiotics, take as instructed by your doctor until the entire course is completed  Bowel Habits Avoid laxatives unless instructed by your doctor. Take a fiber supplement twice a day (Metamucil, FiberCon, Benefiber) Avoid excessive straining to have a bowel movement Do not go for more than 3 days without a bowel  movement.  Take a regular Fleet enema if you are constipated.  Call the office if unable to do this or no results.    Activity Resume activities as tolerated beginning tomorrow.  Avoid strenuous activities or sports for one week.    Call the office if you have any questions.  Call IMMEDIATELY if you should develop persistent heavy rectal bleeding, increase in pain, difficulty urinating or fever greater than 100 F.   

## 2019-05-12 NOTE — Consult Note (Signed)
Reason for Consult: perirectal abscess Referring Physician:  Dr Barbie BannerAllen  Roberto Franki MonteWorley Jr. is an 38 y.o. male.  HPI: 38 y.o. M with recurrent perirectal abscesses who presents to ED with pain and anterior mass since Mon.  Low grade fevers.  CT shows 2 cm anterior abscess.  EDP unable to attempt bedside drainage   Past Medical History:  Diagnosis Date  . Anxiety    (f/b by psych)  . Hypertension   . Migraine   . Smoker    former    Past Surgical History:  Procedure Laterality Date  . INCISION AND DRAINAGE ABSCESS ANAL  04/10/2011   Dr Daphine DeutscherMartin  . INCISION AND DRAINAGE ABSCESS ANAL  May 2013   Dr Magnus IvanBlackman  . INCISION AND DRAINAGE ABSCESS ANAL  09/15/2012   Dr Biagio QuintLayton  . LAPAROSCOPIC APPENDECTOMY N/A 09/08/2015   Procedure: APPENDECTOMY LAPAROSCOPIC;  Surgeon: Chevis PrettyPaul Toth III, MD;  Location: [facility];  Service: General;  Laterality: N/A;    Family History  Problem Relation Age of Onset  . COPD Father   . Heart disease Sister        congenital    Social History:  reports that he quit smoking about 8 years ago. His smoking use included cigarettes. He smoked 1.00 pack per day. He has never used smokeless tobacco. He reports that he does not drink alcohol or use drugs.  Allergies:  Allergies  Allergen Reactions  . Effexor [Venlafaxine Hydrochloride] Swelling and Other (See Comments)    Throat swelling, blurred vision    Medications: I have reviewed the patient's current medications.  Results for orders placed or performed during the hospital encounter of 05/12/19 (from the past 48 hour(s))  Comprehensive metabolic panel     Status: Abnormal   Collection Time: 05/12/19  5:36 PM  Result Value Ref Range   Sodium 139 135 - 145 mmol/L   Potassium 3.6 3.5 - 5.1 mmol/L   Chloride 101 98 - 111 mmol/L   CO2 26 22 - 32 mmol/L   Glucose, Bld 107 (H) 70 - 99 mg/dL   BUN 18 6 - 20 mg/dL   Creatinine, Ser 1.611.17 0.61 - 1.24 mg/dL   Calcium 9.5 8.9 - 09.610.3 mg/dL   Total Protein 8.2 (H) 6.5 -  8.1 g/dL   Albumin 4.5 3.5 - 5.0 g/dL   AST 34 15 - 41 U/L   ALT 46 (H) 0 - 44 U/L   Alkaline Phosphatase 87 38 - 126 U/L   Total Bilirubin 0.6 0.3 - 1.2 mg/dL   GFR calc non Af Amer >60 >60 mL/min   GFR calc Af Amer >60 >60 mL/min   Anion gap 12 5 - 15    Comment: Performed at [facility], [address] [place], [place] [address]  CBC with Differential     Status: Abnormal   Collection Time: 05/12/19  5:36 PM  Result Value Ref Range   WBC 16.1 (H) 4.0 - 10.5 K/uL   RBC 5.08 4.22 - 5.81 MIL/uL   Hemoglobin 16.4 13.0 - 17.0 g/dL   HCT 98.146.9 19.139.0 - 47.852.0 %   MCV 92.3 80.0 - 100.0 fL   MCH 32.3 26.0 - 34.0 pg   MCHC 35.0 30.0 - 36.0 g/dL   RDW 29.512.1 62.111.5 - 30.815.5 %   Platelets 346 150 - 400 K/uL   nRBC 0.0 0.0 - 0.2 %   Neutrophils Relative % 67 %   Neutro Abs 11.0 (H) 1.7 - 7.7 K/uL  Lymphocytes Relative 21 %   Lymphs Abs 3.4 0.7 - 4.0 K/uL   Monocytes Relative 9 %   Monocytes Absolute 1.4 (H) 0.1 - 1.0 K/uL   Eosinophils Relative 1 %   Eosinophils Absolute 0.1 0.0 - 0.5 K/uL   Basophils Relative 1 %   Basophils Absolute 0.1 0.0 - 0.1 K/uL   Immature Granulocytes 1 %   Abs Immature Granulocytes 0.08 (H) 0.00 - 0.07 K/uL    Comment: Performed at [facility], [address] [place], [place] [address]  Urinalysis, Routine w reflex microscopic     Status: Abnormal   Collection Time: 05/12/19  7:30 PM  Result Value Ref Range   Color, Urine YELLOW YELLOW   APPearance CLEAR CLEAR   Specific Gravity, Urine 1.034 (H) 1.005 - 1.030   pH 6.0 5.0 - 8.0   Glucose, UA NEGATIVE NEGATIVE mg/dL   Hgb urine dipstick NEGATIVE NEGATIVE   Bilirubin Urine NEGATIVE NEGATIVE   Ketones, ur NEGATIVE NEGATIVE mg/dL   Protein, ur 30 (A) NEGATIVE mg/dL   Nitrite NEGATIVE NEGATIVE   Leukocytes,Ua NEGATIVE NEGATIVE   WBC, UA 0-5 0 - 5 WBC/hpf   Bacteria, UA NONE SEEN NONE SEEN   Mucus PRESENT    Ca Oxalate Crys, UA PRESENT     Comment: Performed at [facility], [address] [place], [place] [address]  Lactic acid, plasma     Status: None   Collection Time: 05/12/19  8:00 PM  Result Value Ref Range   Lactic Acid, Venous 1.3 0.5 - 1.9 mmol/L    Comment: Performed at [facility], [address] [place], [place] [address]    Ct Abdomen Pelvis W Contrast  Result Date: 05/12/2019 CLINICAL DATA:  Abdominal pain.  Fever. EXAM: CT ABDOMEN AND PELVIS WITH CONTRAST TECHNIQUE: Multidetector CT imaging of the abdomen and pelvis was performed using the standard protocol following bolus administration of intravenous contrast. CONTRAST:  100mL OMNIPAQUE IOHEXOL 300 MG/ML  SOLN COMPARISON:  08/26/2017. FINDINGS: Lower chest: The lung bases are clear. The heart size is normal. Hepatobiliary: There is severe hepatic steatosis. Normal gallbladder.There is no biliary ductal dilation. Pancreas: Normal contours without ductal dilatation. No peripancreatic fluid collection. Spleen: No splenic laceration or hematoma. Adrenals/Urinary Tract: --Adrenal glands: No adrenal hemorrhage. --Right kidney/ureter: No hydronephrosis or perinephric hematoma. --Left kidney/ureter: No hydronephrosis or perinephric hematoma. --Urinary bladder: Unremarkable. Stomach/Bowel: --Stomach/Duodenum: No hiatal hernia or other gastric abnormality. Normal duodenal course and caliber. --Small bowel: No dilatation or inflammation. --Colon: No focal abnormality. --Appendix: Surgically absent. Vascular/Lymphatic: Normal course and caliber of the major abdominal vessels. --No retroperitoneal lymphadenopathy. --No mesenteric lymphadenopathy. --No pelvic or inguinal lymphadenopathy. Reproductive: Unremarkable Other: There is a 2.1 x 1.2 cm fluid collection in the right perianal region. The abdominal wall is normal. Musculoskeletal. No acute displaced fractures. IMPRESSION: 1. There is a 2.1 x 1.2 cm perianal abscess. 2. Hepatic steatosis. Electronically Signed   By:  Katherine Mantlehristopher  Green M.D.   On: 05/12/2019 21:13    Review of Systems  Constitutional: Negative for chills and fever.  HENT: Negative for hearing loss.   Eyes: Negative for blurred vision.  Respiratory: Negative for cough and shortness of breath.   Cardiovascular: Negative for chest pain and palpitations.  Gastrointestinal: Negative for nausea and vomiting.  Genitourinary: Negative for dysuria and urgency.  Musculoskeletal: Negative for myalgias.  Neurological: Negative for dizziness.   Blood pressure 133/82, pulse 82, temperature 98.8 F (37.1 C), temperature source  Oral, resp. rate 20, SpO2 96 %. Physical Exam  Constitutional: He appears well-developed and well-nourished. No distress.  HENT:  Head: Normocephalic and atraumatic.  Eyes: Pupils are equal, round, and reactive to light. Conjunctivae and EOM are normal.  Neck: Normal range of motion. Neck supple.  Cardiovascular: Normal rate and regular rhythm.  Respiratory: Effort normal. No respiratory distress.  GI: Soft. He exhibits no distension.  Genitourinary:    Genitourinary Comments: Fluctuance l lateral and ant midline   Musculoskeletal: Normal range of motion.  Neurological: He is alert.  Skin: Skin is warm and dry.    Assessment/Plan: 38 y.o. M with bilateral perirectal abscess.  I&D performed at bedside.  Instructions given to patient.  No need for antibiotics.  F/u in office in 2 wks.      PROCEDURE:  Incision and drainage  SURGEON:  Roberto Ruff, MD  ANESTHESIA:   local 1% Xylocaine  EBL:  min  PATIENT DISPOSITION:  home  INDICATION: perirectal abscess  FINDINGS: R ant midline abscess, L anterolateral abscess  DESCRIPTION: the patient was identified and verbal consent was obtained.  They were laid lateral on the ed room table.  Local anesthesia was induced without difficulty (1% Xylocaine). The patient was then prepped in the usual sterile fashion.  I made a small incision over the L lateral fluid  collection and enlarged this for adequate drainage.  I then incised over the anterior abscess in similar fashion.  Hemostasis was obtained with direct pressure.  A dressing was applied.     Rosario Adie 3/97/6734, 10:11 PM

## 2019-05-12 NOTE — ED Notes (Signed)
Pt provided with urinal. Pt currently attempting to provide urine sample.

## 2019-05-12 NOTE — ED Provider Notes (Signed)
[facility]-EMERGENCY DEPT Provider Note   CSN: [id number] Arrival date & time: 05/12/19  1642     History   Chief Complaint Chief Complaint  Patient presents with  . Abscess    HPI Roberto BowWilliam Bram Jr. is a 38 y.o. male.     38 year old male presents with several days of perineal fullness with fever to 102 which is been medicating with Tylenol.  History of abscesses in that region in the past.  Denies any rectal drainage at this time.  No emesis noted.  No bloody stools.  Symptoms have been persistent and nothing makes them better worse.     Past Medical History:  Diagnosis Date  . Anxiety    (f/b by psych)  . Hypertension   . Migraine   . Smoker    former    Patient Active Problem List   Diagnosis Date Noted  . Hypertension 08/09/2012  . Migraine     Past Surgical History:  Procedure Laterality Date  . INCISION AND DRAINAGE ABSCESS ANAL  04/10/2011   Dr Daphine DeutscherMartin  . INCISION AND DRAINAGE ABSCESS ANAL  May 2013   Dr Magnus IvanBlackman  . INCISION AND DRAINAGE ABSCESS ANAL  09/15/2012   Dr Biagio QuintLayton  . LAPAROSCOPIC APPENDECTOMY N/A 09/08/2015   Procedure: APPENDECTOMY LAPAROSCOPIC;  Surgeon: Chevis PrettyPaul Toth III, MD;  Location: [facility];  Service: General;  Laterality: N/A;        Home Medications    Prior to Admission medications   Medication Sig Start Date End Date Taking? Authorizing Provider  acetaminophen-codeine (TYLENOL #3) 300-30 MG tablet Take 1-2 tablets by mouth every 6 (six) hours as needed for moderate pain. 06/05/18   Bing NeighborsHarris, Kimberly S, FNP  ARIPiprazole (ABILIFY) 10 MG tablet Take 10 mg by mouth at bedtime.     [provider]  divalproex (DEPAKOTE) 250 MG 24 hr tablet Take 250 mg by mouth at bedtime.     [provider]  lisinopril-hydrochlorothiazide (PRINZIDE,ZESTORETIC) 10-12.5 MG tablet TAKE ONE TABLET BY MOUTH ONE TIME DAILY **NEW INS CARD?? Patient not taking: Reported on 08/26/2017 12/09/15   Ronnald NianLalonde, John C, MD   meloxicam (MOBIC) 15 MG tablet Take 1 tablet (15 mg total) by mouth daily. 06/05/18   Bing NeighborsHarris, Kimberly S, FNP  ondansetron (ZOFRAN) 4 MG tablet Take 1 tablet (4 mg total) by mouth every 8 (eight) hours as needed for nausea or vomiting. Patient not taking: Reported on 04/29/2018 08/26/17   Loren RacerYelverton, David, MD    Family History Family History  Problem Relation Age of Onset  . COPD Father   . Heart disease Sister        congenital    Social History Social History   Tobacco Use  . Smoking status: Former Smoker    Packs/day: 1.00    Types: Cigarettes    Quit date: 03/29/2011    Years since quitting: 8.1  . Smokeless tobacco: Never Used  Substance Use Topics  . Alcohol use: No  . Drug use: No     Allergies   Effexor [venlafaxine hydrochloride]   Review of Systems Review of Systems  All other systems reviewed and are negative.    Physical Exam Updated Vital Signs BP (!) 157/100 (BP Location: Right Arm)   Pulse 97   Temp 98.8 F (37.1 C) (Oral)   Resp 20   SpO2 96%   Physical Exam Vitals signs and nursing note reviewed.  Constitutional:      General: He is not in acute  distress.    Appearance: Normal appearance. He is well-developed. He is not toxic-appearing.  HENT:     Head: Normocephalic and atraumatic.  Eyes:     General: Lids are normal.     Conjunctiva/sclera: Conjunctivae normal.     Pupils: Pupils are equal, round, and reactive to light.  Neck:     Musculoskeletal: Normal range of motion and neck supple.     Thyroid: No thyroid mass.     Trachea: No tracheal deviation.  Cardiovascular:     Rate and Rhythm: Normal rate and regular rhythm.     Heart sounds: Normal heart sounds. No murmur. No gallop.   Pulmonary:     Effort: Pulmonary effort is normal. No respiratory distress.     Breath sounds: Normal breath sounds. No stridor. No decreased breath sounds, wheezing, rhonchi or rales.  Abdominal:     General: Bowel sounds are normal. There is no  distension.     Palpations: Abdomen is soft.     Tenderness: There is no abdominal tenderness. There is no rebound.  Genitourinary:    Rectum: No mass.    Musculoskeletal: Normal range of motion.        General: No tenderness.  Skin:    General: Skin is warm and dry.     Findings: No abrasion or rash.  Neurological:     Mental Status: He is alert and oriented to person, place, and time.     GCS: GCS eye subscore is 4. GCS verbal subscore is 5. GCS motor subscore is 6.     Cranial Nerves: No cranial nerve deficit.     Sensory: No sensory deficit.  Psychiatric:        Speech: Speech normal.        Behavior: Behavior normal.      ED Treatments / Results  Labs (all labs ordered are listed, but only abnormal results are displayed) Labs Reviewed  COMPREHENSIVE METABOLIC PANEL - Abnormal; Notable for the following components:      Result Value   Glucose, Bld 107 (*)    Total Protein 8.2 (*)    ALT 46 (*)    All other components within normal limits  CBC WITH DIFFERENTIAL/PLATELET - Abnormal; Notable for the following components:   WBC 16.1 (*)    Neutro Abs 11.0 (*)    Monocytes Absolute 1.4 (*)    Abs Immature Granulocytes 0.08 (*)    All other components within normal limits  LACTIC ACID, PLASMA  LACTIC ACID, PLASMA  URINALYSIS, ROUTINE W REFLEX MICROSCOPIC    EKG None  Radiology No results found.  Procedures Procedures (including critical care time)  Medications Ordered in ED Medications  cefTRIAXone (ROCEPHIN) 2 g in sodium chloride 0.9 % 100 mL IVPB (has no administration in time range)  metroNIDAZOLE (FLAGYL) IVPB 500 mg (has no administration in time range)     Initial Impression / Assessment and Plan / ED Course  I have reviewed the triage vital signs and the nursing notes.  Pertinent labs & imaging results that were available during my care of the patient were reviewed by me and considered in my medical decision making (see chart for details).        Patient with evidence of leukocytosis. Patient given parenteral antibiotics.  For suspected intra-abdominal infection.  Patient's abdominal CT shows a perianal abscess.  Discussed with Dr. Maisie Fushomas from general surgery and she will come see.  Final Clinical Impressions(s) / ED Diagnoses   Final diagnoses:  None    ED Discharge Orders    None       Lacretia Leigh, MD 05/12/19 2130

## 2019-05-12 NOTE — ED Triage Notes (Signed)
Per patient, states he has an perirectal abscess-states increased pain-has been running a fever, controlled via Tylenol

## 2019-05-13 MED ORDER — CEPHALEXIN 500 MG PO CAPS: 500.0000 mg | ORAL_CAPSULE | Freq: Four times a day (QID) | ORAL | 0 refills | Status: DC

## 2019-05-13 MED ORDER — OXYCODONE-ACETAMINOPHEN 5-325 MG PO TABS: 1.0000 | ORAL_TABLET | ORAL | 0 refills | Status: DC | PRN

## 2019-05-15 ENCOUNTER — Encounter (HOSPITAL_COMMUNITY): Payer: Self-pay | Admitting: Emergency Medicine

## 2019-05-15 ENCOUNTER — Emergency Department (HOSPITAL_COMMUNITY)
Admission: EM | Admit: 2019-05-15 | Discharge: 2019-05-15 | Disposition: A | Discharge status: Home / Self Care | Payer: Self-pay | Attending: Emergency Medicine | Admitting: Emergency Medicine

## 2019-05-15 ENCOUNTER — Other Ambulatory Visit: Payer: Self-pay

## 2019-05-15 DIAGNOSIS — K611 Rectal abscess: Secondary | ICD-10-CM | POA: Insufficient documentation

## 2019-05-15 DIAGNOSIS — Z87891 Personal history of nicotine dependence: Secondary | ICD-10-CM | POA: Insufficient documentation

## 2019-05-15 DIAGNOSIS — Z5189 Encounter for other specified aftercare: Secondary | ICD-10-CM

## 2019-05-15 DIAGNOSIS — I10 Essential (primary) hypertension: Secondary | ICD-10-CM | POA: Insufficient documentation

## 2019-05-15 DIAGNOSIS — Z4800 Encounter for change or removal of nonsurgical wound dressing: Secondary | ICD-10-CM | POA: Insufficient documentation

## 2019-05-15 DIAGNOSIS — Z79899 Other long term (current) drug therapy: Secondary | ICD-10-CM | POA: Insufficient documentation

## 2019-05-15 MED ORDER — AMOXICILLIN-POT CLAVULANATE 875-125 MG PO TABS: 1.0000 | ORAL_TABLET | Freq: Two times a day (BID) | ORAL | 0 refills | Status: AC

## 2019-05-15 NOTE — ED Triage Notes (Signed)
Pt reports that had rectal abscess lanced last week and is currently on antibiotics. C/o pain and swelling is worse. Reports right side was swollen and had lots of infection in it but during night did bust and has had lots of bloody drainage from it. Reports also intermittent fevers.

## 2019-05-15 NOTE — ED Provider Notes (Signed)
[facility] Provider Note   CSN: [id number] Arrival date & time: 05/15/19  0940     History   Chief Complaint Chief Complaint  Patient presents with  . Abscess    HPI Roberto BowWilliam Benefiel Jr. is a 38 y.o. male.     38 year old male with prior medical history as detailed below presents for evaluation of his perirectal abscess status post recent I&D.  Patient was seen and evaluated at this facility 3 days prior.  At that time he had 2 perirectal abscesses drained by Dr. Maisie Fushomas of surgery.  He presents today primarily for wound check.  He reports that his packing fell out over the weekend.  He reports continued drainage from both sites.  He denies fever in the last 24 hours.  He was prescribed Keflex on Friday and continues to take this.  He denies other complaint.  The history is provided by the patient and medical records.  Abscess Abscess location: Perirectal. Abscess quality: draining, painful and redness   Pain details:    Quality:  Dull Chronicity:  New Relieved by:  Nothing Worsened by:  Nothing   Past Medical History:  Diagnosis Date  . Anxiety    (f/b by psych)  . Hypertension   . Migraine   . Smoker    former    Patient Active Problem List   Diagnosis Date Noted  . Hypertension 08/09/2012  . Migraine     Past Surgical History:  Procedure Laterality Date  . INCISION AND DRAINAGE ABSCESS ANAL  04/10/2011   Dr Daphine DeutscherMartin  . INCISION AND DRAINAGE ABSCESS ANAL  May 2013   Dr Magnus IvanBlackman  . INCISION AND DRAINAGE ABSCESS ANAL  09/15/2012   Dr Biagio QuintLayton  . LAPAROSCOPIC APPENDECTOMY N/A 09/08/2015   Procedure: APPENDECTOMY LAPAROSCOPIC;  Surgeon: Chevis PrettyPaul Toth III, MD;  Location: [facility];  Service: General;  Laterality: N/A;        Home Medications    Prior to Admission medications   Medication Sig Start Date End Date Taking? Authorizing Provider  acetaminophen (TYLENOL) 325 MG tablet Take 650 mg by mouth every 6 (six) hours as needed  for moderate pain.    [provider]  acetaminophen-codeine (TYLENOL #3) 300-30 MG tablet Take 1-2 tablets by mouth every 6 (six) hours as needed for moderate pain. Patient not taking: Reported on 05/12/2019 06/05/18   Bing NeighborsHarris, Kimberly S, FNP  ARIPiprazole (ABILIFY) 10 MG tablet Take 10 mg by mouth at bedtime.     [provider]  cephALEXin (KEFLEX) 500 MG capsule Take 1 capsule (500 mg total) by mouth 4 (four) times daily. 05/13/19   Elpidio AnisUpstill, Shari, PA-C  divalproex (DEPAKOTE) 250 MG 24 hr tablet Take 250 mg by mouth at bedtime.     [provider]  ibuprofen (ADVIL) 200 MG tablet Take 600 mg by mouth every 6 (six) hours as needed for moderate pain.    [provider]  lisinopril-hydrochlorothiazide (PRINZIDE,ZESTORETIC) 10-12.5 MG tablet TAKE ONE TABLET BY MOUTH ONE TIME DAILY **NEW INS CARD?? Patient not taking: Reported on 08/26/2017 12/09/15   Ronnald NianLalonde, John C, MD  meloxicam (MOBIC) 15 MG tablet Take 1 tablet (15 mg total) by mouth daily. Patient not taking: Reported on 05/12/2019 06/05/18   Bing NeighborsHarris, Kimberly S, FNP  ondansetron (ZOFRAN) 4 MG tablet Take 1 tablet (4 mg total) by mouth every 8 (eight) hours as needed for nausea or vomiting. Patient not taking: Reported on 04/29/2018 08/26/17   Loren RacerYelverton, David, MD  oxyCODONE-acetaminophen (  PERCOCET/ROXICET) 5-325 MG tablet Take 1 tablet by mouth every 4 (four) hours as needed for severe pain. 05/13/19   Charlann Lange, PA-C    Family History Family History  Problem Relation Age of Onset  . COPD Father   . Heart disease Sister        congenital    Social History Social History   Tobacco Use  . Smoking status: Former Smoker    Packs/day: 1.00    Types: Cigarettes    Quit date: 03/29/2011    Years since quitting: 8.1  . Smokeless tobacco: Never Used  Substance Use Topics  . Alcohol use: No  . Drug use: No     Allergies   Effexor [venlafaxine hydrochloride]   Review of Systems Review of Systems   All other systems reviewed and are negative.    Physical Exam Updated Vital Signs BP 140/81 (BP Location: Left Arm)   Pulse (!) 126   Temp 99.3 F (37.4 C) (Oral)   Resp 20   SpO2 98%   Physical Exam Vitals signs and nursing note reviewed.  Constitutional:      General: He is not in acute distress.    Appearance: He is well-developed.  HENT:     Head: Normocephalic and atraumatic.  Eyes:     Conjunctiva/sclera: Conjunctivae normal.     Pupils: Pupils are equal, round, and reactive to light.  Neck:     Musculoskeletal: Normal range of motion and neck supple.  Cardiovascular:     Rate and Rhythm: Normal rate and regular rhythm.     Heart sounds: Normal heart sounds.  Pulmonary:     Effort: Pulmonary effort is normal. No respiratory distress.     Breath sounds: Normal breath sounds.  Abdominal:     General: There is no distension.     Palpations: Abdomen is soft.     Tenderness: There is no abdominal tenderness.  Musculoskeletal: Normal range of motion.        General: No deformity.  Skin:    General: Skin is warm and dry.     Comments: I and D sites on perineum appear mildly erythematous with minimal bloody drainage noted.     Neurological:     General: No focal deficit present.     Mental Status: He is alert and oriented to person, place, and time. Mental status is at baseline.      ED Treatments / Results  Labs (all labs ordered are listed, but only abnormal results are displayed) Labs Reviewed - No data to display  EKG None  Radiology No results found.  Procedures Procedures (including critical care time)  Medications Ordered in ED Medications - No data to display   Initial Impression / Assessment and Plan / ED Course  I have reviewed the triage vital signs and the nursing notes.  Pertinent labs & imaging results that were available during my care of the patient were reviewed by me and considered in my medical decision making (see chart for  details).         MDM  Screen complete  Roberto Lewis. was evaluated in Emergency Department on 05/15/2019 for the symptoms described in the history of present illness. He was evaluated in the context of the global COVID-19 pandemic, which necessitated consideration that the patient might be at risk for infection with the SARS-CoV-2 virus that causes COVID-19. Institutional protocols and algorithms that pertain to the evaluation of patients at risk for COVID-19 are in a  state of rapid change based on information released by regulatory bodies including the CDC and federal and state organizations. These policies and algorithms were followed during the patient's care in the ED.   Patient is presenting for reevaluation status post recent I&D.  His wounds appear to be healing appropriately.  Surgery has evaluated the patient given that Dr. Maisie Fushomas performed his initial I&D in the ED.  Patient is appropriate for discharge.  Importance of close follow-up is stressed.  Strict return precautions given and understood.  Final Clinical Impressions(s) / ED Diagnoses   Final diagnoses:  Wound check, abscess    ED Discharge Orders    None       Wynetta FinesMessick, Johnetta Sloniker C, MD 05/15/19 1109

## 2019-05-15 NOTE — Progress Notes (Signed)
Pt came back to the ED because he was not draining from the I&D site on the left after I&D procedure 05/12/19 in the ED here.   Sitting in the car to come to the ED today it did start draining.  I examined the site it is about 2 CM deep and all old hematoma.  He says he is sitting in the tub TID, nothing was draining.  I recommended he continue Sitz bath and use hand held nozzle on shower to Irrigate the site also.  Continue antibiotics, ED is going to convert him from Keflex to Augmentin.  The site on the right is minimally tender, and does not warrant I&D, he was quite happy with that.  He will call the office for follow up with Dr. Marcello Moores.  He was asking if the MRI could be done sooner, and we told him to call the office and talk with Dr. Micael Hampshire.  Dr. Marlou Starks was also at the bedside as I was examining patient and aggress with the plan.

## 2019-05-15 NOTE — ED Notes (Signed)
Pt ambulatory from triage to room, no assistance needed.

## 2019-05-15 NOTE — Discharge Instructions (Addendum)
Please return for any problem.  Follow-up with your regular care provider as instructed.  Follow-up with Dr. Marcello Moores as instructed.

## 2019-08-01 ENCOUNTER — Other Ambulatory Visit: Payer: Self-pay | Admitting: General Surgery

## 2019-08-01 ENCOUNTER — Other Ambulatory Visit (HOSPITAL_COMMUNITY): Payer: Self-pay | Admitting: General Surgery

## 2019-08-01 DIAGNOSIS — K6289 Other specified diseases of anus and rectum: Secondary | ICD-10-CM

## 2019-08-01 DIAGNOSIS — K611 Rectal abscess: Secondary | ICD-10-CM

## 2019-08-01 DIAGNOSIS — K603 Anal fistula: Secondary | ICD-10-CM

## 2019-08-11 ENCOUNTER — Ambulatory Visit (HOSPITAL_COMMUNITY)
Admission: RE | Admit: 2019-08-11 | Discharge: 2019-08-11 | Disposition: A | Discharge status: Home / Self Care | Payer: Self-pay | Source: Ambulatory Visit | Attending: General Surgery | Admitting: General Surgery

## 2019-08-11 ENCOUNTER — Other Ambulatory Visit: Payer: Self-pay

## 2019-08-11 DIAGNOSIS — K611 Rectal abscess: Secondary | ICD-10-CM | POA: Insufficient documentation

## 2019-08-11 DIAGNOSIS — K6289 Other specified diseases of anus and rectum: Secondary | ICD-10-CM | POA: Insufficient documentation

## 2019-08-11 DIAGNOSIS — K603 Anal fistula: Secondary | ICD-10-CM | POA: Insufficient documentation

## 2019-08-11 LAB — POCT I-STAT CREATININE: Creatinine, Ser: 1.1 mg/dL (ref 0.61–1.24)

## 2019-08-11 MED ORDER — GADOBUTROL 1 MMOL/ML IV SOLN
10.0000 mL | Freq: Once | INTRAVENOUS | Status: AC | PRN
  Administered 2019-08-11: 17:00:00 10 mL via INTRAVENOUS

## 2019-08-28 ENCOUNTER — Ambulatory Visit: Payer: Self-pay | Admitting: General Surgery

## 2020-03-21 ENCOUNTER — Ambulatory Visit (HOSPITAL_COMMUNITY): Payer: Self-pay | Admitting: Clinical

## 2020-03-21 ENCOUNTER — Other Ambulatory Visit: Payer: Self-pay

## 2020-05-28 ENCOUNTER — Encounter (HOSPITAL_COMMUNITY): Payer: Self-pay | Admitting: Psychiatry

## 2020-05-28 ENCOUNTER — Other Ambulatory Visit: Payer: Self-pay

## 2020-05-28 ENCOUNTER — Telehealth (INDEPENDENT_AMBULATORY_CARE_PROVIDER_SITE_OTHER): Payer: No Payment, Other | Admitting: Psychiatry

## 2020-05-28 DIAGNOSIS — F324 Major depressive disorder, single episode, in partial remission: Secondary | ICD-10-CM | POA: Diagnosis not present

## 2020-05-28 MED ORDER — ARIPIPRAZOLE 10 MG PO TABS: 10.0000 mg | ORAL_TABLET | Freq: Every day | ORAL | 2 refills | Status: DC
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
## 2020-08-27 ENCOUNTER — Encounter (HOSPITAL_COMMUNITY): Payer: Self-pay | Admitting: Psychiatry

## 2020-08-27 ENCOUNTER — Telehealth (INDEPENDENT_AMBULATORY_CARE_PROVIDER_SITE_OTHER): Payer: No Payment, Other | Admitting: Psychiatry

## 2020-08-27 ENCOUNTER — Other Ambulatory Visit: Payer: Self-pay

## 2020-08-27 DIAGNOSIS — F324 Major depressive disorder, single episode, in partial remission: Secondary | ICD-10-CM | POA: Diagnosis not present

## 2020-08-27 MED ORDER — ARIPIPRAZOLE 10 MG PO TABS: 10.0000 mg | ORAL_TABLET | Freq: Every day | ORAL | 2 refills | Status: DC

## 2020-08-27 NOTE — Progress Notes (Signed)
BH MD/PA/NP OP Progress Note Virtual Visit via Telephone Note  I connected with Roberto Bow. on 08/27/20 at  3:30 PM EST by telephone and verified that I am speaking with the correct person using two identifiers.  Location: Patient: home Provider: Clinic   I discussed the limitations, risks, security and privacy concerns of performing an evaluation and management service by telephone and the availability of in person appointments. I also discussed with the patient that there may be a patient responsible charge related to this service. The patient expressed understanding and agreed to proceed.   I provided of non-face-to-face time during this encounter.      08/27/2020 3:41 PM Roberto Bow.  MRN:  [id number]  Chief Complaint: "Things are going well"  HPI: 39 year old male seen today for follow up psychiatric evaluation. He has a psychiatric history or anxiety and depression. He noted that years before being seen at [facility] he was diagnosed with Bipolar disorder however notes that he was misdiagnosed.  He is currently managed on Abilify 10 mg daily.  Today patient unable to log on virtually so the assessment was done on the phone.  During assessment patient is pleasant, calm, cooperative, and engaged in conversation.  He informed provider that things are going well.  He noted that he and his wife will soon refinance their home and is looking to buy property so that they can build a home.  Today he denies symptoms of depression however noted he occasionally feels anxious.  Provider conducted a GAD-7 and patient scored a 4.  He informed Clinical research associate that at times he feels that something awful might happen with his wife, kids, or other family members getting Covid.  He noted that this thought is very infrequent.  He denies SI/HI/VH or paranoia.  Today he endorses adequate sleep and appetite.  Patient informed provider that he and his wife has a newborn baby and at times feels tired  but associates it with the new addition to his family and his work schedule.  No medication adjustments made today. Patient is agreeable to continue all medications as prescribed. No other concerns noted at this time.   Visit Diagnosis:    ICD-10-CM   1. Major depressive disorder with single episode, in partial remission (HCC)  F32.4 ARIPiprazole (ABILIFY) 10 MG tablet    Past Psychiatric History: Depression and anxiety.   Past Medical History:  Past Medical History:  Diagnosis Date  . Anxiety    (f/b by psych)  . Hypertension   . Migraine   . Smoker    former    Past Surgical History:  Procedure Laterality Date  . INCISION AND DRAINAGE ABSCESS ANAL  04/10/2011   Dr Daphine Deutscher  . INCISION AND DRAINAGE ABSCESS ANAL  May 2013   Dr Magnus Ivan  . INCISION AND DRAINAGE ABSCESS ANAL  09/15/2012   Dr Biagio Quint  . LAPAROSCOPIC APPENDECTOMY N/A 09/08/2015   Procedure: APPENDECTOMY LAPAROSCOPIC;  Surgeon: Chevis Pretty III, MD;  Location: [facility];  Service: General;  Laterality: N/A;    Family Psychiatric History: Father Bipolar disoder  Family History:  Family History  Problem Relation Age of Onset  . COPD Father   . Heart disease Sister        congenital    Social History:  Social History   Socioeconomic History  . Marital status: Married    Spouse name: Not on file  . Number of children: 1  . Years of education: Not on file  .  Highest education level: Not on file  Occupational History  . Occupation: Forensic scientist: [employer]  Tobacco Use  . Smoking status: Former Smoker    Packs/day: 1.00    Types: Cigarettes    Quit date: 03/29/2011    Years since quitting: 9.4  . Smokeless tobacco: Never Used  Vaping Use  . Vaping Use: Never used  Substance and Sexual Activity  . Alcohol use: No  . Drug use: No  . Sexual activity: Yes  Other Topics Concern  . Not on file  Social History Narrative  . Not on file   Social Determinants of Health   Financial  Resource Strain:   . Difficulty of Paying Living Expenses: Not on file  Food Insecurity:   . Worried About Programme researcher, broadcasting/film/video in the Last Year: Not on file  . Ran Out of Food in the Last Year: Not on file  Transportation Needs:   . Lack of Transportation (Medical): Not on file  . Lack of Transportation (Non-Medical): Not on file  Physical Activity:   . Days of Exercise per Week: Not on file  . Minutes of Exercise per Session: Not on file  Stress:   . Feeling of Stress : Not on file  Social Connections:   . Frequency of Communication with Friends and Family: Not on file  . Frequency of Social Gatherings with Friends and Family: Not on file  . Attends Religious Services: Not on file  . Active Member of Clubs or Organizations: Not on file  . Attends Banker Meetings: Not on file  . Marital Status: Not on file    Allergies:  Allergies  Allergen Reactions  . Effexor [Venlafaxine Hydrochloride] Swelling and Other (See Comments)    Throat swelling, blurred vision    Metabolic Disorder Labs: Lab Results  Component Value Date   HGBA1C 5.7 (H) 12/16/2015   MPG 117 12/16/2015   MPG 117 (H) 07/18/2015   Lab Results  Component Value Date   PROLACTIN 7.5 07/25/2015   No results found for: CHOL, TRIG, HDL, CHOLHDL, VLDL, LDLCALC Lab Results  Component Value Date   TSH 1.050 07/18/2015   TSH 1.295 10/20/2013    Therapeutic Level Labs: No results found for: LITHIUM No results found for: VALPROATE No components found for:  CBMZ  Current Medications: Current Outpatient Medications  Medication Sig Dispense Refill  . acetaminophen (TYLENOL) 325 MG tablet Take 650 mg by mouth every 6 (six) hours as needed for moderate pain.    [name] acetaminophen-codeine (TYLENOL #3) 300-30 MG tablet Take 1-2 tablets by mouth every 6 (six) hours as needed for moderate pain. (Patient not taking: Reported on 05/12/2019) 15 tablet 0  . ARIPiprazole (ABILIFY) 10 MG tablet Take 1 tablet (10 mg  total) by mouth at bedtime. 30 tablet 2  . cephALEXin (KEFLEX) 500 MG capsule Take 1 capsule (500 mg total) by mouth 4 (four) times daily. 28 capsule 0  . ibuprofen (ADVIL) 200 MG tablet Take 600 mg by mouth every 6 (six) hours as needed for moderate pain.    [name] lisinopril-hydrochlorothiazide (PRINZIDE,ZESTORETIC) 10-12.5 MG tablet TAKE ONE TABLET BY MOUTH ONE TIME DAILY **NEW INS CARD?? (Patient not taking: Reported on 08/26/2017) 90 tablet 1  . meloxicam (MOBIC) 15 MG tablet Take 1 tablet (15 mg total) by mouth daily. (Patient not taking: Reported on 05/12/2019) 30 tablet 1  . ondansetron (ZOFRAN) 4 MG tablet Take 1 tablet (4 mg total) by mouth every  8 (eight) hours as needed for nausea or vomiting. (Patient not taking: Reported on 04/29/2018) 10 tablet 0  . oxyCODONE-acetaminophen (PERCOCET/ROXICET) 5-325 MG tablet Take 1 tablet by mouth every 4 (four) hours as needed for severe pain. 8 tablet 0   No current facility-administered medications for this visit.     Musculoskeletal: Strength & Muscle Tone: Unable to assess due to telephone visit Gait & Station: Unable to assess due to telephone visit Patient leans: N/A  Psychiatric Specialty Exam: Review of Systems  There were no vitals taken for this visit.There is no height or weight on file to calculate BMI.  General Appearance: Unable to assess due to telephone visit  Eye Contact:  Unable to assess due to telephone visit  Speech:  Clear and Coherent and Normal Rate  Volume:  Normal  Mood:  Euthymic  Affect:  Appropriate and Congruent  Thought Process:  Coherent, Goal Directed and Linear  Orientation:  Full (Time, Place, and Person)  Thought Content: WDL and Logical   Suicidal Thoughts:  No  Homicidal Thoughts:  No  Memory:  Immediate;   Good Recent;   Good Remote;   Good  Judgement:  Good  Insight:  Good  Psychomotor Activity:  Normal  Concentration:  Concentration: Good and Attention Span: Good  Recall:  Good  Fund of Knowledge:  Good  Language: Good  Akathisia:  No  Handed:  Right  AIMS (if indicated): Not done   Assets:  Communication Skills Desire for Improvement Financial Resources/Insurance Housing Intimacy Social Support  ADL's:  Intact  Cognition: WNL  Sleep:  Good   Screenings: GAD-7     Video Visit [facility] 08/27/2020 in [facility]  Total GAD-7 Score 4    PHQ2-9     Video Visit from 08/27/2020 in [facility]  PHQ-2 Total Score 0  PHQ-9 Total Score 0       Assessment and Plan: Patient informed provider that he is doing well on his current medication regimen.  No medication changes made today.  Patient agreeable to continue all medications as prescribed.  1. Major depressive disorder with single episode, in partial remission (HCC)  Continue- ARIPiprazole (ABILIFY) 10 MG tablet; Take 1 tablet (10 mg total) by mouth at bedtime.  Dispense: 30 tablet; Refill: 2  Follow-up in 3 months   Shanna Cisco, NP 08/27/2020, 3:41 PM

## 2020-11-18 ENCOUNTER — Encounter (HOSPITAL_COMMUNITY): Payer: Self-pay

## 2020-11-18 ENCOUNTER — Other Ambulatory Visit: Payer: Self-pay

## 2020-11-18 ENCOUNTER — Emergency Department (HOSPITAL_COMMUNITY)
Admission: EM | Admit: 2020-11-18 | Discharge: 2020-11-18 | Disposition: A | Discharge status: Home / Self Care | Payer: Self-pay | Attending: Emergency Medicine | Admitting: Emergency Medicine

## 2020-11-18 DIAGNOSIS — H81399 Other peripheral vertigo, unspecified ear: Secondary | ICD-10-CM | POA: Insufficient documentation

## 2020-11-18 DIAGNOSIS — H6123 Impacted cerumen, bilateral: Secondary | ICD-10-CM | POA: Insufficient documentation

## 2020-11-18 DIAGNOSIS — Z87891 Personal history of nicotine dependence: Secondary | ICD-10-CM | POA: Insufficient documentation

## 2020-11-18 DIAGNOSIS — Z20822 Contact with and (suspected) exposure to covid-19: Secondary | ICD-10-CM | POA: Insufficient documentation

## 2020-11-18 DIAGNOSIS — I10 Essential (primary) hypertension: Secondary | ICD-10-CM | POA: Insufficient documentation

## 2020-11-18 DIAGNOSIS — Z79899 Other long term (current) drug therapy: Secondary | ICD-10-CM | POA: Insufficient documentation

## 2020-11-18 LAB — CBC
HCT: 44.1 % (ref 39.0–52.0)
Hemoglobin: 14.9 g/dL (ref 13.0–17.0)
MCH: 31.5 pg (ref 26.0–34.0)
MCHC: 33.8 g/dL (ref 30.0–36.0)
MCV: 93.2 fL (ref 80.0–100.0)
Platelets: 246 10*3/uL (ref 150–400)
RBC: 4.73 MIL/uL (ref 4.22–5.81)
RDW: 11.9 % (ref 11.5–15.5)
WBC: 11.5 10*3/uL — ABNORMAL HIGH (ref 4.0–10.5)
nRBC: 0 % (ref 0.0–0.2)

## 2020-11-18 LAB — GROUP A STREP BY PCR: Group A Strep by PCR: NOT DETECTED

## 2020-11-18 LAB — BASIC METABOLIC PANEL
Anion gap: 8 (ref 5–15)
BUN: 17 mg/dL (ref 6–20)
CO2: 25 mmol/L (ref 22–32)
Calcium: 8.6 mg/dL — ABNORMAL LOW (ref 8.9–10.3)
Chloride: 105 mmol/L (ref 98–111)
Creatinine, Ser: 0.88 mg/dL (ref 0.61–1.24)
GFR, Estimated: >60 mL/min (ref >60)
Glucose, Bld: 118 mg/dL — ABNORMAL HIGH (ref 70–99)
Potassium: 3.9 mmol/L (ref 3.5–5.1)
Sodium: 138 mmol/L (ref 135–145)

## 2020-11-18 MED ORDER — SODIUM CHLORIDE 0.9 % IV BOLUS
1000.0000 mL | Freq: Once | INTRAVENOUS | Status: AC
  Administered 2020-11-18: 1000 mL via INTRAVENOUS

## 2020-11-18 MED ORDER — MECLIZINE HCL 25 MG PO TABS
25.0000 mg | ORAL_TABLET | Freq: Once | ORAL | Status: AC
  Administered 2020-11-18: 25 mg via ORAL
  Filled 2020-11-18: qty 1

## 2020-11-18 MED ORDER — MECLIZINE HCL 25 MG PO TABS: 25.0000 mg | ORAL_TABLET | Freq: Three times a day (TID) | ORAL | 0 refills | Status: DC | PRN
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
## 2020-11-18 NOTE — ED Provider Notes (Signed)
[facility] Roberto Lewis Note   CSN: [id number] Arrival date & time: 11/18/20  1058     History Chief Complaint  Patient presents with  . Dizziness    Roberto Lewis. is a 40 y.o. male.  The history is provided by the patient. No language interpreter was used.  Dizziness    40 year old male significant history of hypertension, anxiety, migraine, tobacco use, sent here via EMS from urgent care center for evaluation of dizziness.  Patient report he was driving on his way to work today when he developed acute onset of dizziness.  He described as a room spinning sensation that came on acutely causing him to have to pull his car over on the side of the road.  He subsequently was able to make it to work when his symptoms did subside.  Symptoms then became more intense with movement.  He did felt nauseous, and some mild tightness to his forehead.  He report for the past few days he did have some mild cold symptoms including a mild headache, congestion, sore throat, lasted for about 2 3 days but has since improved.  He has not been vaccinated for COVID-19.  Does not complain of any fever chills, no diplopia, focal numbness or focal weakness, severe headache, loss of taste or smell.  He denies any recent sick contact.  He denies any loss of hearing or ringing in his ears.  He did went to urgent care center but was recommended to come here for further care.  Past Medical History:  Diagnosis Date  . Anxiety    (f/b by psych)  . Hypertension   . Migraine   . Smoker    former    Patient Active Problem List   Diagnosis Date Noted  . Major depressive disorder with single episode, in partial remission (HCC) 05/28/2020  . Hypertension 08/09/2012  . Migraine     Past Surgical History:  Procedure Laterality Date  . INCISION AND DRAINAGE ABSCESS ANAL  04/10/2011   Dr Daphine Deutscher  . INCISION AND DRAINAGE ABSCESS ANAL  May 2013   Dr Magnus Ivan  . INCISION AND DRAINAGE  ABSCESS ANAL  09/15/2012   Dr Biagio Quint  . LAPAROSCOPIC APPENDECTOMY N/A 09/08/2015   Procedure: APPENDECTOMY LAPAROSCOPIC;  Surgeon: Chevis Pretty III, MD;  Location: [facility];  Service: General;  Laterality: N/A;       Family History  Problem Relation Age of Onset  . COPD Father   . Heart disease Sister        congenital    Social History   Tobacco Use  . Smoking status: Former Smoker    Packs/day: 1.00    Types: Cigarettes    Quit date: 03/29/2011    Years since quitting: 9.6  . Smokeless tobacco: Never Used  Vaping Use  . Vaping Use: Never used  Substance Use Topics  . Alcohol use: No  . Drug use: No    Home Medications Prior to Admission medications   Medication Sig Start Date End Date Taking? Authorizing Roberto Lewis  acetaminophen (TYLENOL) 325 MG tablet Take 650 mg by mouth every 6 (six) hours as needed for moderate pain.    Roberto Lewis, Historical, MD  acetaminophen-codeine (TYLENOL #3) 300-30 MG tablet Take 1-2 tablets by mouth every 6 (six) hours as needed for moderate pain. Patient not taking: Reported on 05/12/2019 06/05/18   Bing Neighbors, FNP  ARIPiprazole (ABILIFY) 10 MG tablet Take 1 tablet (10 mg total) by mouth at bedtime. 08/27/20  Toy Cookey E, NP  cephALEXin (KEFLEX) 500 MG capsule Take 1 capsule (500 mg total) by mouth 4 (four) times daily. 05/13/19   Elpidio Anis, PA-C  ibuprofen (ADVIL) 200 MG tablet Take 600 mg by mouth every 6 (six) hours as needed for moderate pain.    Roberto Lewis, Historical, MD  lisinopril-hydrochlorothiazide (PRINZIDE,ZESTORETIC) 10-12.5 MG tablet TAKE ONE TABLET BY MOUTH ONE TIME DAILY **NEW INS CARD?? Patient not taking: Reported on 08/26/2017 12/09/15   Ronnald Nian, MD  meloxicam (MOBIC) 15 MG tablet Take 1 tablet (15 mg total) by mouth daily. Patient not taking: Reported on 05/12/2019 06/05/18   Bing Neighbors, FNP  ondansetron (ZOFRAN) 4 MG tablet Take 1 tablet (4 mg total) by mouth every 8 (eight) hours as needed for nausea  or vomiting. Patient not taking: Reported on 04/29/2018 08/26/17   Loren Racer, MD  oxyCODONE-acetaminophen (PERCOCET/ROXICET) 5-325 MG tablet Take 1 tablet by mouth every 4 (four) hours as needed for severe pain. 05/13/19   Elpidio Anis, PA-C    Allergies    Effexor [venlafaxine hydrochloride]  Review of Systems   Review of Systems  Neurological: Positive for dizziness.  All other systems reviewed and are negative.   Physical Exam Updated Vital Signs BP (!) 139/94   Pulse 77   Temp 98.5 F (36.9 C) (Oral)   Resp 14   Ht 6' (1.829 m)   Wt 113.4 kg   SpO2 99%   BMI 33.91 kg/m   Physical Exam Vitals and nursing note reviewed.  Constitutional:      General: He is not in acute distress.    Appearance: He is well-developed and well-nourished.  HENT:     Head: Normocephalic and atraumatic.     Ears:     Comments: Cerumen impaction involve both ear canals and unable to visualize TMs.    Nose: Nose normal.     Mouth/Throat:     Mouth: Mucous membranes are moist.     Pharynx: Oropharynx is clear. No oropharyngeal exudate or posterior oropharyngeal erythema.  Eyes:     Extraocular Movements: Extraocular movements intact.     Conjunctiva/sclera: Conjunctivae normal.     Pupils: Pupils are equal, round, and reactive to light.     Comments: No nystagmus.  Cardiovascular:     Rate and Rhythm: Normal rate and regular rhythm.     Pulses: Normal pulses.     Heart sounds: Normal heart sounds.  Pulmonary:     Effort: Pulmonary effort is normal.     Breath sounds: Normal breath sounds.  Abdominal:     Palpations: Abdomen is soft.  Musculoskeletal:     Cervical back: Normal range of motion and neck supple. No rigidity.  Skin:    Findings: No rash.  Neurological:     Mental Status: He is alert and oriented to person, place, and time.     GCS: GCS eye subscore is 4. GCS verbal subscore is 5. GCS motor subscore is 6.     Cranial Nerves: Cranial nerves are intact.     Sensory:  Sensation is intact.     Motor: Motor function is intact.     Coordination: Coordination is intact.     Gait: Gait is intact.  Psychiatric:        Mood and Affect: Mood and affect normal.     ED Results / Procedures / Treatments   Labs (all labs ordered are listed, but only abnormal results are displayed) Labs Reviewed  BASIC  METABOLIC PANEL - Abnormal; Notable for the following components:      Result Value   Glucose, Bld 118 (*)    Calcium 8.6 (*)    All other components within normal limits  CBC - Abnormal; Notable for the following components:   WBC 11.5 (*)    All other components within normal limits  GROUP A STREP BY PCR  SARS CORONAVIRUS 2 (TAT 6-24 HRS)    EKG None  Radiology No results found.  Procedures Procedures   Medications Ordered in ED Medications  meclizine (ANTIVERT) tablet 25 mg (25 mg Oral Given 11/18/20 1249)  sodium chloride 0.9 % bolus 1,000 mL (1,000 mLs Intravenous New Bag/Given 11/18/20 1245)    ED Course  I have reviewed the triage vital signs and the nursing notes.  Pertinent labs & imaging results that were available during my care of the patient were reviewed by me and considered in my medical decision making (see chart for details).    MDM Rules/Calculators/A&P                          BP (!) 139/94   Pulse 77   Temp 98.5 F (36.9 C) (Oral)   Resp 14   Ht 6' (1.829 m)   Wt 113.4 kg   SpO2 99%   BMI 33.91 kg/m   Final Clinical Impression(s) / ED Diagnoses Final diagnoses:  Peripheral vertigo, unspecified laterality    Rx / DC Orders ED Discharge Orders         Ordered    meclizine (ANTIVERT) 25 MG tablet  3 times daily PRN        11/18/20 1457         12:32 PM Patient here with acute onset of dizziness suggestive of peripheral vertigo.  Improved symptoms with rest worse with movement.  He does have cerumen impaction involving both ear which possibly contribute to it but he also endorsed having some cold symptoms  prior to the onset of this.  Throat exam unremarkable, strep test and COVID-19 screening test ordered.  Will check basic labs, EKG, orthostatic vital signs and will give IV fluid and meclizine.  2:22 PM Labs are reassuring, strep test negative, normal orthostatic vital sign, Covid test is currently pending.  Patient report improvement of symptoms after receiving IV fluid as well as meclizine.  Previously said that on exam he does have induced vertigo with head positional change likely consistent with BPPV.  Doubt central cause.  Glenice Bow. was evaluated in Emergency Department on 11/18/2020 for the symptoms described in the history of present illness. He was evaluated in the context of the global COVID-19 pandemic, which necessitated consideration that the patient might be at risk for infection with the SARS-CoV-2 virus that causes COVID-19. Institutional protocols and algorithms that pertain to the evaluation of patients at risk for COVID-19 are in a state of rapid change based on information released by regulatory bodies including the CDC and federal and state organizations. These policies and algorithms were followed during the patient's care in the ED.    Fayrene Helper, PA-C 11/18/20 1459    Bethann Berkshire, MD 11/18/20 [phone]

## 2020-11-18 NOTE — Discharge Instructions (Signed)
Your dizziness is likely due to benign paroxysmal positional vertigo which is dizziness worsening with positional changes.  Take meclizine as needed and follow-up for your Covid test through MyChart, link below over the next 6 to 24 hours.  Return to the ER if you have any concerns specifically if your symptoms persist even at rest.

## 2020-11-19 LAB — SARS CORONAVIRUS 2 (TAT 6-24 HRS): SARS Coronavirus 2: NEGATIVE

## 2020-11-26 ENCOUNTER — Encounter (HOSPITAL_COMMUNITY): Payer: Self-pay | Admitting: Psychiatry

## 2020-11-26 ENCOUNTER — Other Ambulatory Visit: Payer: Self-pay

## 2020-11-26 ENCOUNTER — Telehealth (INDEPENDENT_AMBULATORY_CARE_PROVIDER_SITE_OTHER): Payer: No Payment, Other | Admitting: Psychiatry

## 2020-11-26 DIAGNOSIS — F3341 Major depressive disorder, recurrent, in partial remission: Secondary | ICD-10-CM

## 2020-11-26 MED ORDER — ARIPIPRAZOLE 10 MG PO TABS: 10.0000 mg | ORAL_TABLET | Freq: Every day | ORAL | 2 refills | Status: DC

## 2020-11-26 NOTE — Progress Notes (Signed)
BH MD/PA/NP OP Progress Note Virtual Visit via Video Note  I connected with Roberto Bow. on 11/26/20 at  4:00 PM EST by a video enabled telemedicine application and verified that I am speaking with the correct person using two identifiers.  Location: Patient: Home Provider: Clinic   I discussed the limitations of evaluation and management by telemedicine and the availability of in person appointments. The patient expressed understanding and agreed to proceed.  I provided 30 minutes of non-face-to-face time during this encounter.         11/26/2020 4:13 PM Roberto Bow.  MRN:  [id number]  Chief Complaint: "Things are going well"  HPI: 40 year old male seen today for follow up psychiatric evaluation. He has a psychiatric history or anxiety and depression. He noted that years before being seen at [facility] he was diagnosed with Bipolar disorder however notes that he was misdiagnosed.  He is currently managed on Abilify 10 mg daily and reports that it is effective to conditions.  Today he is pleasant, calm, cooperative, maintained eye contact, and engaged in conversation.  He informed provider that since his last visit his anxiety and depression has remained somewhat the same.  He notes that he and his wife refinance their home and are now working on projects around the house.  He notes that the project sometimes makes him anxious.  Today provider conducted a GAD-7 and patient scored a 4, at his last visit he also scored a 4.  Provider also conducted a PHQ-9 and patient scored a 1.  Today he endorses adequate sleep and appetite.  He denies SI/HI/VAH or paranoia.  No medication adjustments made today. Patient is agreeable to continue all medications as prescribed. No other concerns noted at this time.   Visit Diagnosis:    ICD-10-CM   1. Recurrent major depressive disorder, in partial remission (HCC)  F33.41 ARIPiprazole (ABILIFY) 10 MG tablet    Past Psychiatric History:  Depression and anxiety.   Past Medical History:  Past Medical History:  Diagnosis Date  . Anxiety    (f/b by psych)  . Hypertension   . Migraine   . Smoker    former    Past Surgical History:  Procedure Laterality Date  . INCISION AND DRAINAGE ABSCESS ANAL  04/10/2011   Dr Daphine Deutscher  . INCISION AND DRAINAGE ABSCESS ANAL  May 2013   Dr Magnus Ivan  . INCISION AND DRAINAGE ABSCESS ANAL  09/15/2012   Dr Biagio Quint  . LAPAROSCOPIC APPENDECTOMY N/A 09/08/2015   Procedure: APPENDECTOMY LAPAROSCOPIC;  Surgeon: Chevis Pretty III, MD;  Location: [facility];  Service: General;  Laterality: N/A;    Family Psychiatric History: Father Bipolar disoder  Family History:  Family History  Problem Relation Age of Onset  . COPD Father   . Heart disease Sister        congenital    Social History:  Social History   Socioeconomic History  . Marital status: Married    Spouse name: Not on file  . Number of children: 1  . Years of education: Not on file  . Highest education level: Not on file  Occupational History  . Occupation: Forensic scientist: [employer]  Tobacco Use  . Smoking status: Former Smoker    Packs/day: 1.00    Types: Cigarettes    Quit date: 03/29/2011    Years since quitting: 9.6  . Smokeless tobacco: Never Used  Vaping Use  . Vaping Use: Never used  Substance  and Sexual Activity  . Alcohol use: No  . Drug use: No  . Sexual activity: Yes  Other Topics Concern  . Not on file  Social History Narrative  . Not on file   Social Determinants of Health   Financial Resource Strain: Not on file  Food Insecurity: Not on file  Transportation Needs: Not on file  Physical Activity: Not on file  Stress: Not on file  Social Connections: Not on file    Allergies:  Allergies  Allergen Reactions  . Effexor [Venlafaxine Hydrochloride] Swelling and Other (See Comments)    Throat swelling, blurred vision    Metabolic Disorder Labs: Lab Results  Component Value Date    HGBA1C 5.7 (H) 12/16/2015   MPG 117 12/16/2015   MPG 117 (H) 07/18/2015   Lab Results  Component Value Date   PROLACTIN 7.5 07/25/2015   No results found for: CHOL, TRIG, HDL, CHOLHDL, VLDL, LDLCALC Lab Results  Component Value Date   TSH 1.050 07/18/2015   TSH 1.295 10/20/2013    Therapeutic Level Labs: No results found for: LITHIUM No results found for: VALPROATE No components found for:  CBMZ  Current Medications: Current Outpatient Medications  Medication Sig Dispense Refill  . acetaminophen (TYLENOL) 325 MG tablet Take 650 mg by mouth every 6 (six) hours as needed for moderate pain.    . ARIPiprazole (ABILIFY) 10 MG tablet Take 1 tablet (10 mg total) by mouth at bedtime. 30 tablet 2  . ibuprofen (ADVIL) 200 MG tablet Take 600 mg by mouth every 6 (six) hours as needed for moderate pain.    [name] lisinopril-hydrochlorothiazide (PRINZIDE,ZESTORETIC) 10-12.5 MG tablet TAKE ONE TABLET BY MOUTH ONE TIME DAILY **NEW INS CARD?? (Patient not taking: No sig reported) 90 tablet 1  . meclizine (ANTIVERT) 25 MG tablet Take 1 tablet (25 mg total) by mouth 3 (three) times daily as needed for dizziness. 30 tablet 0  . OVER THE COUNTER MEDICATION Take 6 mg by mouth as needed (dipping sensation). Zyn nicotine pouch     No current facility-administered medications for this visit.     Musculoskeletal: Strength & Muscle Tone: Unable to assess due to telehealth visit Gait & Station: Unable to assess due to telehealth visit Patient leans: N/A  Psychiatric Specialty Exam: Review of Systems  There were no vitals taken for this visit.There is no height or weight on file to calculate BMI.  General Appearance: Well Groomed  Eye Contact:  Good  Speech:  Clear and Coherent and Normal Rate  Volume:  Normal  Mood:  Euthymic  Affect:  Appropriate and Congruent  Thought Process:  Coherent, Goal Directed and Linear  Orientation:  Full (Time, Place, and Person)  Thought Content: WDL and Logical    Suicidal Thoughts:  No  Homicidal Thoughts:  No  Memory:  Immediate;   Good Recent;   Good Remote;   Good  Judgement:  Good  Insight:  Good  Psychomotor Activity:  Normal  Concentration:  Concentration: Good and Attention Span: Good  Recall:  Good  Fund of Knowledge: Good  Language: Good  Akathisia:  No  Handed:  Right  AIMS (if indicated): Not done   Assets:  Communication Skills Desire for Improvement Financial Resources/Insurance Housing Intimacy Social Support  ADL's:  Intact  Cognition: WNL  Sleep:  Good   Screenings: GAD-7   Flowsheet Row Video Visit from 11/26/2020 in [facility] Video Visit from 08/27/2020 in [facility]  Total GAD-7  Score 4 4    PHQ2-9   Flowsheet Row Video Visit from 11/26/2020 in [facility] Video Visit from 08/27/2020 in [facility]  PHQ-2 Total Score 0 0  PHQ-9 Total Score 1 0    Flowsheet Row Video Visit from 11/26/2020 in [facility] ED from 11/18/2020 in [facility]  C-SSRS RISK CATEGORY No Risk No Risk       Assessment and Plan: Patient informed provider that he is doing well on his current medication regimen.  No medication changes made today.  Patient agreeable to continue all medications as prescribed.  1.  Recurrent major depressive disorder, in partial remission (HCC)  Continue- ARIPiprazole (ABILIFY) 10 MG tablet; Take 1 tablet (10 mg total) by mouth at bedtime.  Dispense: 30 tablet; Refill: 2   Follow-up in 3 months   Shanna Cisco, NP 11/26/2020, 4:13 PM

## 2021-02-19 ENCOUNTER — Telehealth (INDEPENDENT_AMBULATORY_CARE_PROVIDER_SITE_OTHER): Payer: No Payment, Other | Admitting: Psychiatry

## 2021-02-19 ENCOUNTER — Encounter (HOSPITAL_COMMUNITY): Payer: Self-pay | Admitting: Psychiatry

## 2021-02-19 ENCOUNTER — Other Ambulatory Visit: Payer: Self-pay

## 2021-02-19 DIAGNOSIS — F3341 Major depressive disorder, recurrent, in partial remission: Secondary | ICD-10-CM | POA: Diagnosis not present

## 2021-02-19 MED ORDER — ARIPIPRAZOLE 10 MG PO TABS: 10.0000 mg | ORAL_TABLET | Freq: Every day | ORAL | 2 refills | Status: DC

## 2021-02-19 NOTE — Progress Notes (Signed)
BH MD/PA/NP OP Progress Note Virtual Visit via Telephone Note  I connected with Roberto Bow. on 02/19/21 at  4:00 PM EDT by telephone and verified that I am speaking with the correct person using two identifiers.  Location: Patient: home Provider: Clinic   I discussed the limitations, risks, security and privacy concerns of performing an evaluation and management service by telephone and the availability of in person appointments. I also discussed with the patient that there may be a patient responsible charge related to this service. The patient expressed understanding and agreed to proceed.   I provided 30 minutes of non-face-to-face time during this encounter.           02/19/2021 4:10 PM Roberto Bow.  MRN:  [id number]  Chief Complaint: "I'm Just getting over Covid"  HPI: 40 year old male seen today for follow up psychiatric evaluation. He has a psychiatric history or anxiety and depression.   He is currently managed on Abilify 10 mg daily and reports that it is effective to conditions.  Today he was unable to login virtually so his exam was done over the phone. During exam he was is pleasant, calm, cooperative, and engaged in conversation.  He informed provider that overall he is doing well but notes that he just got over having Covid 19. He informed Clinical research associate that he is now worried because his wife and children has contracted Covid. Today Provider conducted a GAD 7 and patient scored a 3, at his last visit he scored a 4.   Provider also conducted a PHQ-9 and patient scored a 2, at his last visit he scored a1.  Today he endorses adequate sleep and appetite.  He denies SI/HI/VAH or paranoia.  No medication adjustments made today. Patient is agreeable to continue all medications as prescribed. No other concerns noted at this time.   Visit Diagnosis:    ICD-10-CM   1. Recurrent major depressive disorder, in partial remission (HCC)  F33.41 ARIPiprazole (ABILIFY) 10 MG  tablet    Past Psychiatric History: Depression and anxiety.   Past Medical History:  Past Medical History:  Diagnosis Date  . Anxiety    (f/b by psych)  . Hypertension   . Migraine   . Smoker    former    Past Surgical History:  Procedure Laterality Date  . INCISION AND DRAINAGE ABSCESS ANAL  04/10/2011   Dr Daphine Deutscher  . INCISION AND DRAINAGE ABSCESS ANAL  May 2013   Dr Magnus Ivan  . INCISION AND DRAINAGE ABSCESS ANAL  09/15/2012   Dr Biagio Quint  . LAPAROSCOPIC APPENDECTOMY N/A 09/08/2015   Procedure: APPENDECTOMY LAPAROSCOPIC;  Surgeon: Chevis Pretty III, MD;  Location: [facility];  Service: General;  Laterality: N/A;    Family Psychiatric History: Father Bipolar disoder  Family History:  Family History  Problem Relation Age of Onset  . COPD Father   . Heart disease Sister        congenital    Social History:  Social History   Socioeconomic History  . Marital status: Married    Spouse name: Not on file  . Number of children: 1  . Years of education: Not on file  . Highest education level: Not on file  Occupational History  . Occupation: Forensic scientist: [employer]  Tobacco Use  . Smoking status: Former Smoker    Packs/day: 1.00    Types: Cigarettes    Quit date: 03/29/2011    Years since quitting: 9.9  .  Smokeless tobacco: Never Used  Vaping Use  . Vaping Use: Never used  Substance and Sexual Activity  . Alcohol use: No  . Drug use: No  . Sexual activity: Yes  Other Topics Concern  . Not on file  Social History Narrative  . Not on file   Social Determinants of Health   Financial Resource Strain: Not on file  Food Insecurity: Not on file  Transportation Needs: Not on file  Physical Activity: Not on file  Stress: Not on file  Social Connections: Not on file    Allergies:  Allergies  Allergen Reactions  . Effexor [Venlafaxine Hydrochloride] Swelling and Other (See Comments)    Throat swelling, blurred vision    Metabolic Disorder  Labs: Lab Results  Component Value Date   HGBA1C 5.7 (H) 12/16/2015   MPG 117 12/16/2015   MPG 117 (H) 07/18/2015   Lab Results  Component Value Date   PROLACTIN 7.5 07/25/2015   No results found for: CHOL, TRIG, HDL, CHOLHDL, VLDL, LDLCALC Lab Results  Component Value Date   TSH 1.050 07/18/2015   TSH 1.295 10/20/2013    Therapeutic Level Labs: No results found for: LITHIUM No results found for: VALPROATE No components found for:  CBMZ  Current Medications: Current Outpatient Medications  Medication Sig Dispense Refill  . acetaminophen (TYLENOL) 325 MG tablet Take 650 mg by mouth every 6 (six) hours as needed for moderate pain.    . ARIPiprazole (ABILIFY) 10 MG tablet Take 1 tablet (10 mg total) by mouth at bedtime. 30 tablet 2  . ibuprofen (ADVIL) 200 MG tablet Take 600 mg by mouth every 6 (six) hours as needed for moderate pain.    [name] lisinopril-hydrochlorothiazide (PRINZIDE,ZESTORETIC) 10-12.5 MG tablet TAKE ONE TABLET BY MOUTH ONE TIME DAILY **NEW INS CARD?? (Patient not taking: No sig reported) 90 tablet 1  . meclizine (ANTIVERT) 25 MG tablet Take 1 tablet (25 mg total) by mouth 3 (three) times daily as needed for dizziness. 30 tablet 0  . OVER THE COUNTER MEDICATION Take 6 mg by mouth as needed (dipping sensation). Zyn nicotine pouch     No current facility-administered medications for this visit.     Musculoskeletal: Strength & Muscle Tone: Unable to assess due to telephone visit Gait & Station: Unable to assess due to telephone visit Patient leans: Unable to assess due to telephone visit  Psychiatric Specialty Exam: Review of Systems  There were no vitals taken for this visit.There is no height or weight on file to calculate BMI.  General Appearance: Unable to assess due to telephone visit  Eye Contact:  Good  Speech:  Clear and Coherent and Normal Rate  Volume:  Normal  Mood:  Euthymic  Affect:  Appropriate and Congruent  Thought Process:  Coherent, Goal  Directed and Linear  Orientation:  Full (Time, Place, and Person)  Thought Content: WDL and Logical   Suicidal Thoughts:  No  Homicidal Thoughts:  No  Memory:  Immediate;   Good Recent;   Good Remote;   Good  Judgement:  Good  Insight:  Good  Psychomotor Activity:  Unable to assess due to telephone visit  Concentration:  Concentration: Good and Attention Span: Good  Recall:  Good  Fund of Knowledge: Good  Language: Good  Akathisia:  No  Handed:  Right  AIMS (if indicated): Not done   Assets:  Communication Skills Desire for Improvement Financial Resources/Insurance Housing Intimacy Social Support  ADL's:  Intact  Cognition: WNL  Sleep:  Good   Screenings: GAD-7   Flowsheet Row Video Visit from 02/19/2021 in [facility] Video Visit from 11/26/2020 [facility] Video Visit from 08/27/2020 in [facility]  Total GAD-7 Score 3 4 4     PHQ2-9   Flowsheet Row Video Visit from 02/19/2021 in [facility] Video Visit from 11/26/2020 in [facility] Video Visit from 08/27/2020 in [facility]  PHQ-2 Total Score 2 0 0  PHQ-9 Total Score 3 1 0    Flowsheet Row Video Visit from 11/26/2020 in [facility] ED from 11/18/2020 in [facility]  C-SSRS RISK CATEGORY No Risk No Risk       Assessment and Plan: Patient informed provider that he is doing well on his current medication regimen.  No medication changes made today.  Patient agreeable to continue all medications as prescribed.  1.  Recurrent major depressive disorder, in partial remission (HCC)  Continue- ARIPiprazole (ABILIFY) 10 MG tablet; Take 1 tablet (10 mg total) by mouth at bedtime.  Dispense: 30 tablet; Refill: 2   Follow-up in 3 months   11/20/2020, NP 02/19/2021, 4:10 PM

## 2021-05-21 ENCOUNTER — Telehealth (INDEPENDENT_AMBULATORY_CARE_PROVIDER_SITE_OTHER): Payer: No Payment, Other | Admitting: Psychiatry

## 2021-05-21 ENCOUNTER — Other Ambulatory Visit: Payer: Self-pay

## 2021-05-21 ENCOUNTER — Encounter (HOSPITAL_COMMUNITY): Payer: Self-pay | Admitting: Psychiatry

## 2021-05-21 DIAGNOSIS — F3341 Major depressive disorder, recurrent, in partial remission: Secondary | ICD-10-CM | POA: Diagnosis not present

## 2021-05-21 MED ORDER — ARIPIPRAZOLE 10 MG PO TABS: 10.0000 mg | ORAL_TABLET | Freq: Every day | ORAL | 0 refills | Status: DC

## 2021-05-21 NOTE — Progress Notes (Signed)
BH MD/PA/NP OP Progress Note Virtual Visit via Telephone Note  I connected with Roberto Bow. on 05/21/21 at  2:30 PM EDT by telephone and verified that I am speaking with the correct person using two identifiers.  Location: Patient: home Provider: Clinic   I discussed the limitations, risks, security and privacy concerns of performing an evaluation and management service by telephone and the availability of in person appointments. I also discussed with the patient that there may be a patient responsible charge related to this service. The patient expressed understanding and agreed to proceed.   I provided 30 minutes of non-face-to-face time during this encounter.           05/21/2021 2:42 PM Roberto Bow.  MRN:  [id number]  Chief Complaint: "I'm doing pretty good"  HPI: 40 year old male seen today for follow up psychiatric evaluation. He has a psychiatric history or anxiety and depression.   He is currently managed on Abilify 10 mg daily and reports that it is effective to conditions.  Today he was unable to login virtually so his exam was done over the phone. During exam he was is pleasant, calm, cooperative, and engaged in conversation.  He informed provider that he has been doing pretty good. He notes that he and his family are recently getting over a respitory infection and feels tired at times but overall reports that he is well. He notes that he continues to have minimal anxiety and depression.  Today Provider conducted a GAD 7 and patient scored a 4, at his last visit he scored a 3.   Provider also conducted a PHQ-9 and patient scored a 1, at his last visit he scored a2.  Today he endorses adequate sleep and appetite.  He denies SI/HI/VAH or paranoia.  No medication adjustments made today. Patient is agreeable to continue all medications as prescribed. No other concerns noted at this time.   Visit Diagnosis:    ICD-10-CM   1. Recurrent major depressive disorder,  in partial remission (HCC)  F33.41 ARIPiprazole (ABILIFY) 10 MG tablet      Past Psychiatric History: Depression and anxiety.   Past Medical History:  Past Medical History:  Diagnosis Date   Anxiety    (f/b by psych)   Hypertension    Migraine    Smoker    former    Past Surgical History:  Procedure Laterality Date   INCISION AND DRAINAGE ABSCESS ANAL  04/10/2011   Dr Daphine Deutscher   INCISION AND DRAINAGE ABSCESS ANAL  May 2013   Dr Magnus Ivan   INCISION AND DRAINAGE ABSCESS ANAL  09/15/2012   Dr Biagio Quint   LAPAROSCOPIC APPENDECTOMY N/A 09/08/2015   Procedure: APPENDECTOMY LAPAROSCOPIC;  Surgeon: Chevis Pretty III, MD;  Location: [facility];  Service: General;  Laterality: N/A;    Family Psychiatric History: Father Bipolar disoder  Family History:  Family History  Problem Relation Age of Onset   COPD Father    Heart disease Sister        congenital    Social History:  Social History   Socioeconomic History   Marital status: Married    Spouse name: Not on file   Number of children: 1   Years of education: Not on file   Highest education level: Not on file  Occupational History   Occupation: Forensic scientist: [employer]  Tobacco Use   Smoking status: Former    Packs/day: 1.00    Types: Cigarettes  Quit date: 03/29/2011    Years since quitting: 10.1   Smokeless tobacco: Never  Vaping Use   Vaping Use: Never used  Substance and Sexual Activity   Alcohol use: No   Drug use: No   Sexual activity: Yes  Other Topics Concern   Not on file  Social History Narrative   Not on file   Social Determinants of Health   Financial Resource Strain: Not on file  Food Insecurity: Not on file  Transportation Needs: Not on file  Physical Activity: Not on file  Stress: Not on file  Social Connections: Not on file    Allergies:  Allergies  Allergen Reactions   Effexor [Venlafaxine Hydrochloride] Swelling and Other (See Comments)    Throat swelling, blurred vision     Metabolic Disorder Labs: Lab Results  Component Value Date   HGBA1C 5.7 (H) 12/16/2015   MPG 117 12/16/2015   MPG 117 (H) 07/18/2015   Lab Results  Component Value Date   PROLACTIN 7.5 07/25/2015   No results found for: CHOL, TRIG, HDL, CHOLHDL, VLDL, LDLCALC Lab Results  Component Value Date   TSH 1.050 07/18/2015   TSH 1.295 10/20/2013    Therapeutic Level Labs: No results found for: LITHIUM No results found for: VALPROATE No components found for:  CBMZ  Current Medications: Current Outpatient Medications  Medication Sig Dispense Refill   acetaminophen (TYLENOL) 325 MG tablet Take 650 mg by mouth every 6 (six) hours as needed for moderate pain.     ARIPiprazole (ABILIFY) 10 MG tablet Take 1 tablet (10 mg total) by mouth at bedtime. 90 tablet 0   ibuprofen (ADVIL) 200 MG tablet Take 600 mg by mouth every 6 (six) hours as needed for moderate pain.     lisinopril-hydrochlorothiazide (PRINZIDE,ZESTORETIC) 10-12.5 MG tablet TAKE ONE TABLET BY MOUTH ONE TIME DAILY **NEW INS CARD?? (Patient not taking: No sig reported) 90 tablet 1   meclizine (ANTIVERT) 25 MG tablet Take 1 tablet (25 mg total) by mouth 3 (three) times daily as needed for dizziness. 30 tablet 0   OVER THE COUNTER MEDICATION Take 6 mg by mouth as needed (dipping sensation). Zyn nicotine pouch     No current facility-administered medications for this visit.     Musculoskeletal: Strength & Muscle Tone:  Unable to assess due to telephone visit Gait & Station:  Unable to assess due to telephone visit Patient leans:  Unable to assess due to telephone visit  Psychiatric Specialty Exam: Review of Systems  There were no vitals taken for this visit.There is no height or weight on file to calculate BMI.  General Appearance:  Unable to assess due to telephone visit  Eye Contact:  Good  Speech:  Clear and Coherent and Normal Rate  Volume:  Normal  Mood:  Euthymic  Affect:  Appropriate and Congruent  Thought  Process:  Coherent, Goal Directed and Linear  Orientation:  Full (Time, Place, and Person)  Thought Content: WDL and Logical   Suicidal Thoughts:  No  Homicidal Thoughts:  No  Memory:  Immediate;   Good Recent;   Good Remote;   Good  Judgement:  Good  Insight:  Good  Psychomotor Activity:   Unable to assess due to telephone visit  Concentration:  Concentration: Good and Attention Span: Good  Recall:  Good  Fund of Knowledge: Good  Language: Good  Akathisia:  No  Handed:  Right  AIMS (if indicated): Not done   Assets:  Communication Skills Desire for Improvement  Financial Resources/Insurance Housing Intimacy Social Support  ADL's:  Intact  Cognition: WNL  Sleep:  Good   Screenings: GAD-7    Flowsheet Row Video Visit from 05/21/2021 in [facility] Video Visit from 02/19/2021 in [facility] Video Visit from 11/26/2020 in [facility] Video Visit from 08/27/2020 in [facility]  Total GAD-7 Score 4 3 4 4       PHQ2-9    Flowsheet Row Video Visit from 05/21/2021 in [facility] Video Visit from 02/19/2021 in [facility] Video Visit from 11/26/2020 in [facility] Video Visit from 08/27/2020 in [facility]  PHQ-2 Total Score 0 2 0 0  PHQ-9 Total Score 1 3 1  0      Flowsheet Row Video Visit from 11/26/2020 in [facility] ED from 11/18/2020 in [facility]  C-SSRS RISK CATEGORY No Risk No Risk        Assessment and Plan: Patient informed provider that he is doing well on his current medication regimen.  No medication changes made today.  Patient agreeable to continue all medications as prescribed.  1.  Recurrent major depressive disorder, in partial remission (HCC)  Continue- ARIPiprazole (ABILIFY) 10  MG tablet; Take 1 tablet (10 mg total) by mouth at bedtime.  Dispense: 90 tablet; Refill: 0   Follow-up in 3 months   [facility], NP 05/21/2021, 2:42 PM

## 2021-06-23 IMAGING — CT CT ABDOMEN AND PELVIS WITH CONTRAST
2 of 8 series · 12 of 46 positions shown, 18 images · IV contrast (OMNIPAQUE 300)
Comparison: 08/26/2017.

CLINICAL DATA: Abdominal pain.  Fever.

EXAM:
CT ABDOMEN AND PELVIS WITH CONTRAST
TECHNIQUE: Multidetector CT imaging of the abdomen and pelvis was performed
using the standard protocol following bolus administration of
intravenous contrast.
CONTRAST:  100mL OMNIPAQUE IOHEXOL 300 MG/ML  SOLN

[Series 6: coronal st · coronal · 0.72mm/px · 3 of 151 slices shown]
[im 38/151  soft-tissue]
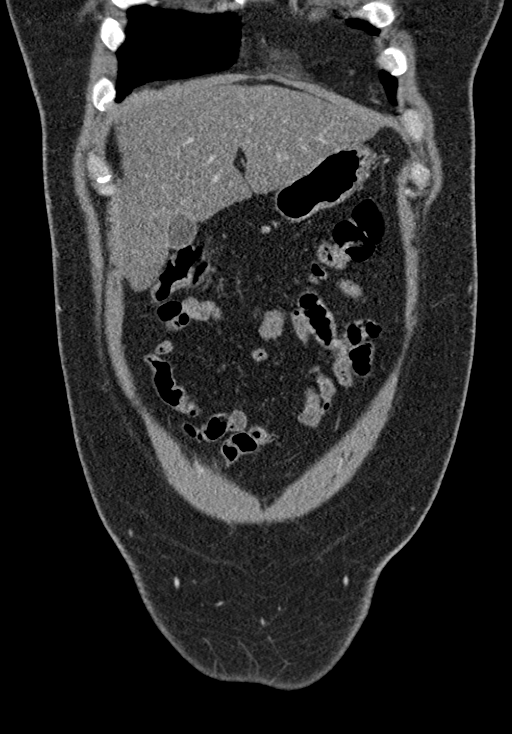
[im 76/151  soft-tissue]
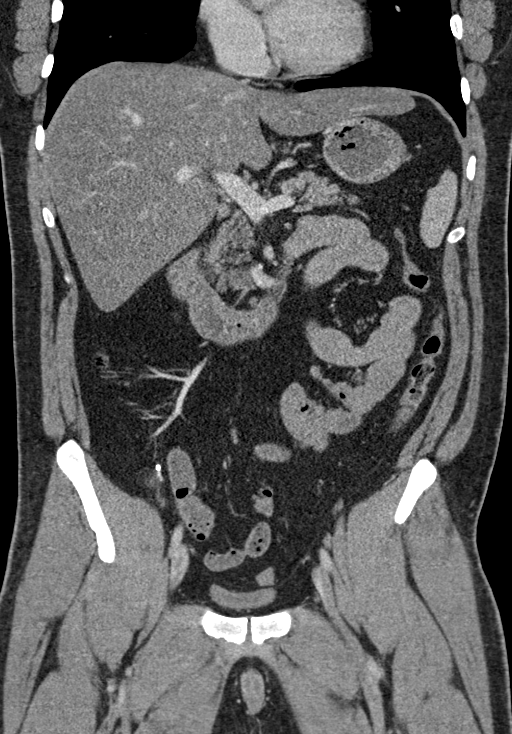
[im 113/151  soft-tissue]
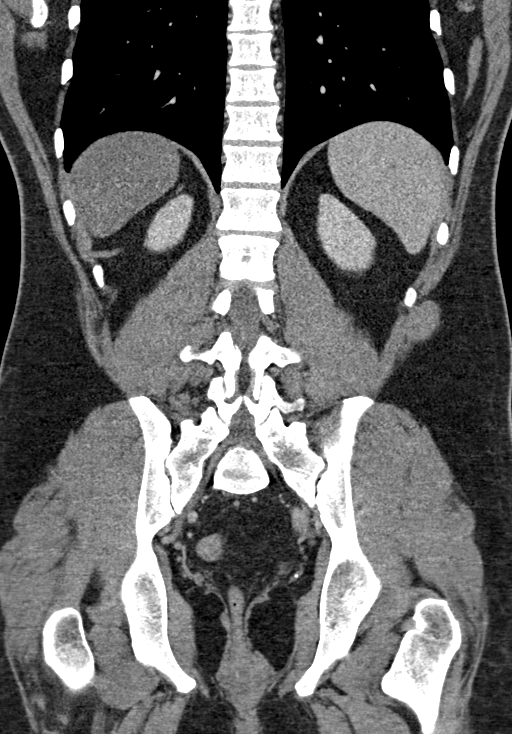

[Series 8: axial st · axial · 0.71mm/px · z∈[-619,-184]mm · 9 of 109 slices shown, 15 images]
[im 11/109  soft-tissue]
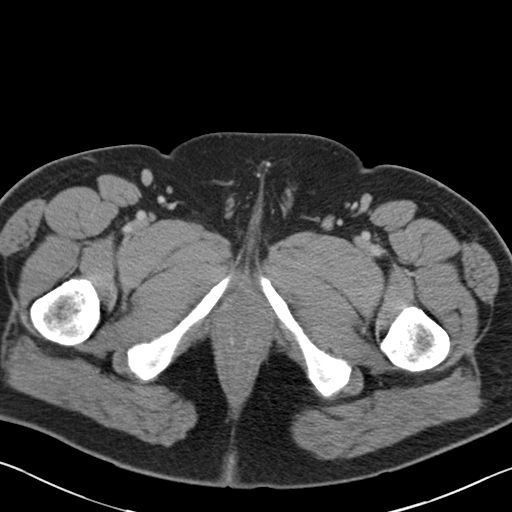
[im 11/109  bone]
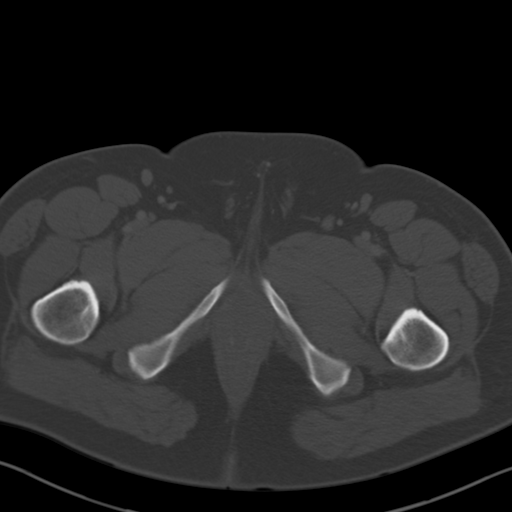
[im 22/109  soft-tissue]
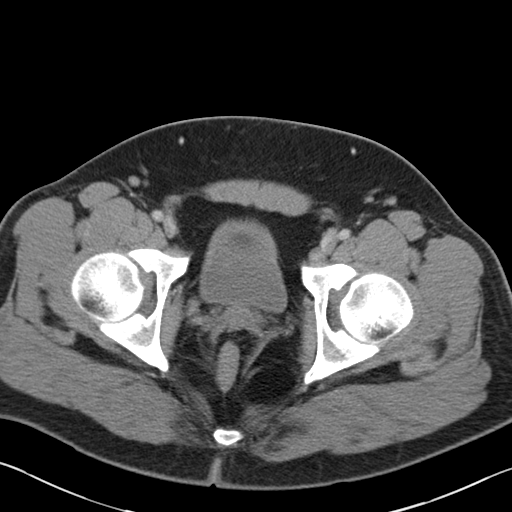
[im 33/109  soft-tissue]
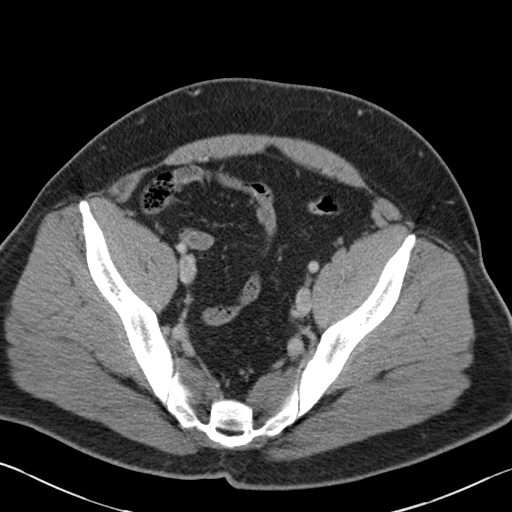
[im 44/109  soft-tissue]
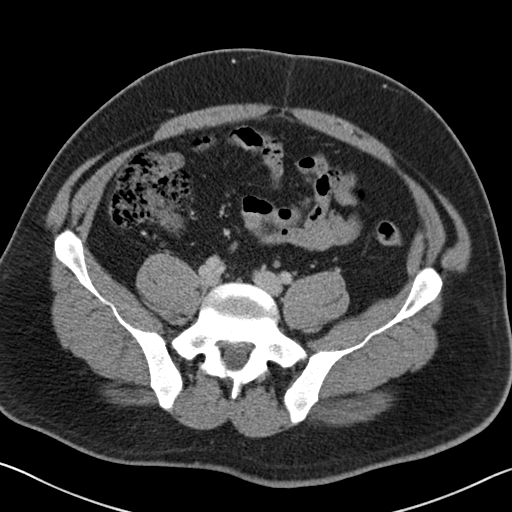
[im 55/109  soft-tissue]
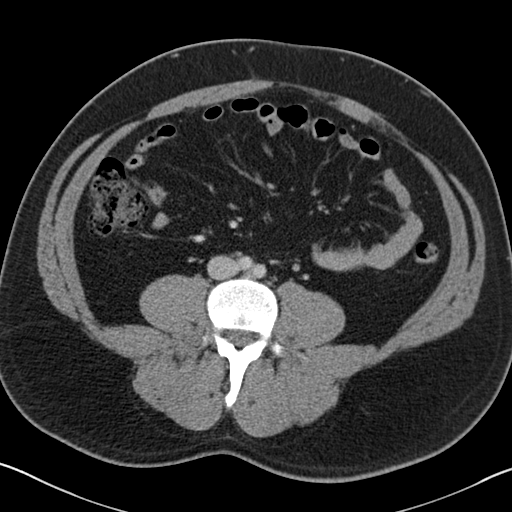
[im 65/109  soft-tissue]
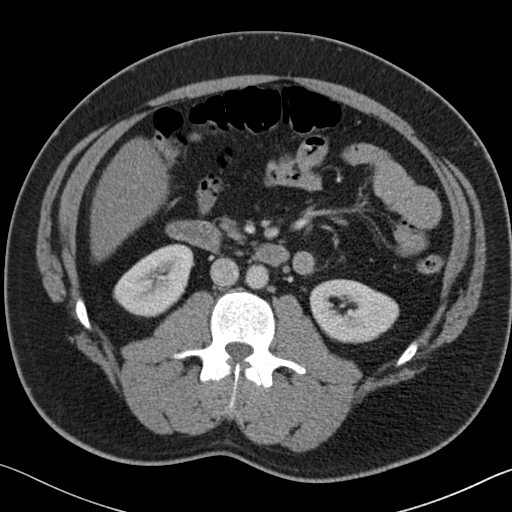
[im 65/109  lung]
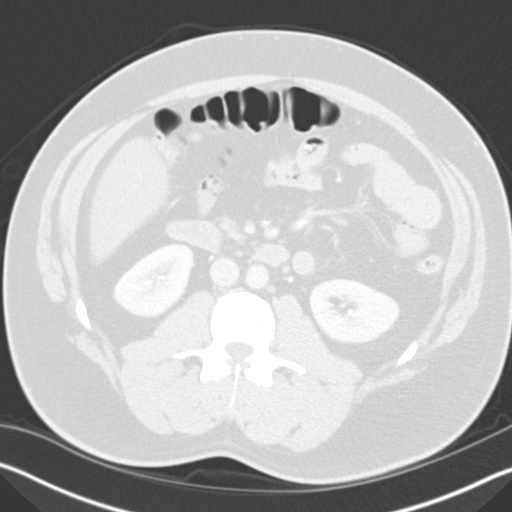
[im 76/109  soft-tissue]
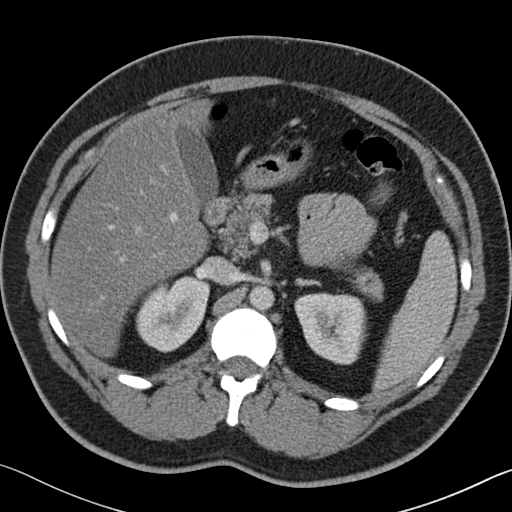
[im 76/109  lung]
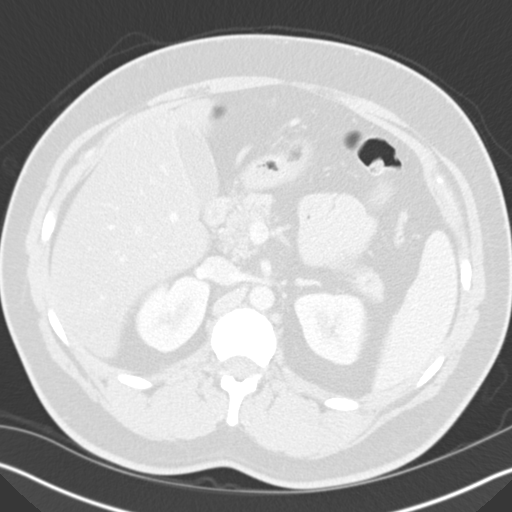
[im 87/109  soft-tissue]
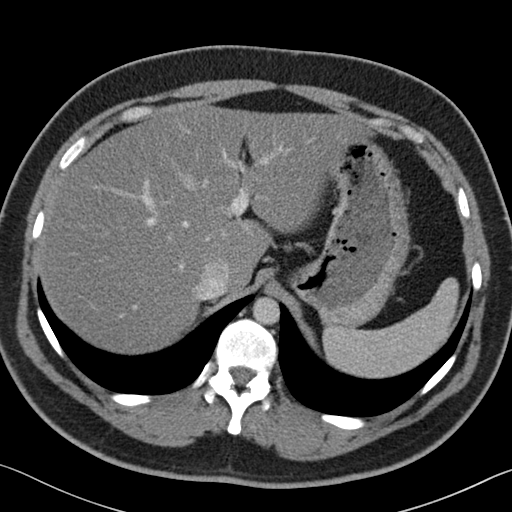
[im 87/109  lung]
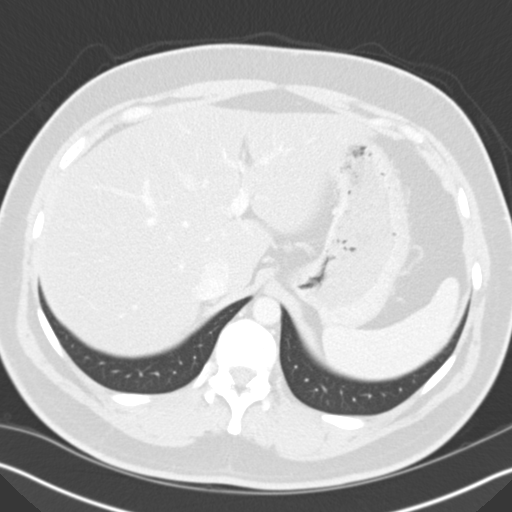
[im 98/109  soft-tissue]
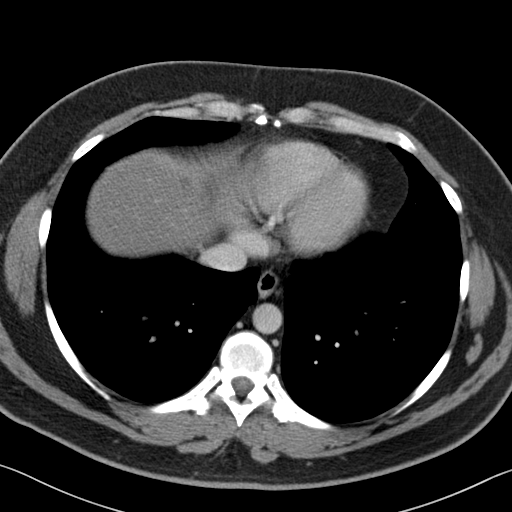
[im 98/109  lung]
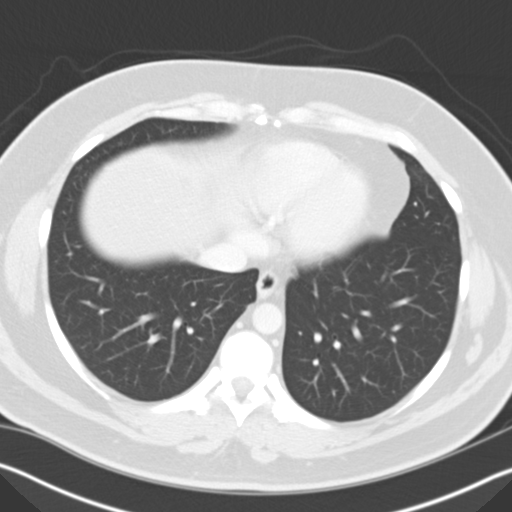
[im 98/109  bone]
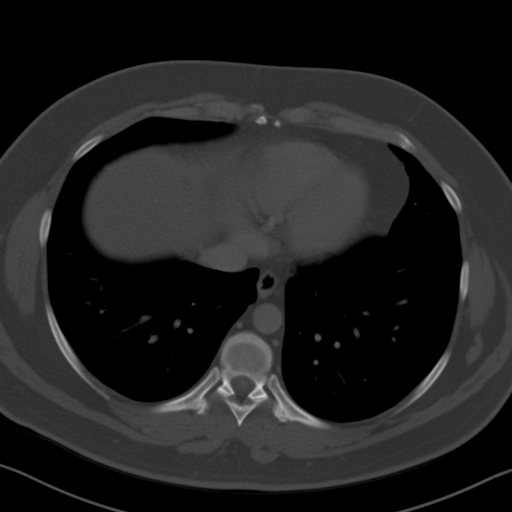

[12 of 46 positions shown; findings below may reference images not displayed]

FINDINGS: Lower chest: The lung bases are clear. The heart size is normal.

Hepatobiliary: There is severe hepatic steatosis. Normal
gallbladder.There is no biliary ductal dilation.

Pancreas: Normal contours without ductal dilatation. No
peripancreatic fluid collection.

Spleen: No splenic laceration or hematoma.

Adrenals/Urinary Tract:

--Adrenal glands: No adrenal hemorrhage.

--Right kidney/ureter: No hydronephrosis or perinephric hematoma.

--Left kidney/ureter: No hydronephrosis or perinephric hematoma.

--Urinary bladder: Unremarkable.

Stomach/Bowel:

--Stomach/Duodenum: No hiatal hernia or other gastric abnormality.
Normal duodenal course and caliber.

--Small bowel: No dilatation or inflammation.

--Colon: No focal abnormality.

--Appendix: Surgically absent.

Vascular/Lymphatic: Normal course and caliber of the major abdominal
vessels.

--No retroperitoneal lymphadenopathy.

--No mesenteric lymphadenopathy.

--No pelvic or inguinal lymphadenopathy.

Reproductive: Unremarkable

Other: There is a 2.1 x 1.2 cm fluid collection in the right
perianal region. The abdominal wall is normal.

Musculoskeletal. No acute displaced fractures.
IMPRESSION: 1. There is a 2.1 x 1.2 cm perianal abscess.
2. Hepatic steatosis.

## 2021-08-27 ENCOUNTER — Encounter (HOSPITAL_COMMUNITY): Payer: Self-pay | Admitting: Psychiatry

## 2021-08-27 ENCOUNTER — Telehealth (INDEPENDENT_AMBULATORY_CARE_PROVIDER_SITE_OTHER): Payer: No Payment, Other | Admitting: Psychiatry

## 2021-08-27 DIAGNOSIS — F3341 Major depressive disorder, recurrent, in partial remission: Secondary | ICD-10-CM

## 2021-08-27 MED ORDER — ARIPIPRAZOLE 10 MG PO TABS: 10.0000 mg | ORAL_TABLET | Freq: Every day | ORAL | 1 refills | Status: DC

## 2021-08-27 NOTE — Progress Notes (Signed)
BH MD/PA/NP OP Progress Note Virtual Visit via Telephone Note  I connected with Roberto Bow. on 08/27/21 at  3:00 PM EST by telephone and verified that I am speaking with the correct person using two identifiers.  Location: Patient: home Provider: Clinic   I discussed the limitations, risks, security and privacy concerns of performing an evaluation and management service by telephone and the availability of in person appointments. I also discussed with the patient that there may be a patient responsible charge related to this service. The patient expressed understanding and agreed to proceed.   I provided 30 minutes of non-face-to-face time during this encounter.           08/27/2021 3:22 PM Roberto Bow.  MRN:  [id number]  Chief Complaint: "Over the last few month I have been having numbness in my hand"  HPI: 40 year old male seen today for follow up psychiatric evaluation. He has a psychiatric history or anxiety and depression.   He is currently managed on Abilify 10 mg daily and reports that it is effective to conditions.  Today he was unable to login virtually so his exam was done over the phone. During exam he was is pleasant, calm, cooperative, and engaged in conversation.  He informed provider that over that last few months he has been having numbness in his hands and arms at night. Writer asked patient if he could be sleeping on his arms and he notes that he was not. He denied chest pain or SOB. Provider informed patient that Abilify generally does not cause numbness in his arms and hands and recommended he see his PCP for further evaluation. He endorsed understanding and agreed.    Patient notes that he continues to have minimal anxiety and depression.  Today Provider conducted a GAD 7 and patient scored a 4, at his last visit he scored a 4.   Provider also conducted a PHQ-9 and patient scored a 1, at his last visit he scored a 1.  Today he endorses adequate sleep  and appetite.  He denies SI/HI/VAH, mania, or paranoia.  No medication adjustments made today. Patient is agreeable to continue all medications as prescribed. No other concerns noted at this time.   Visit Diagnosis:    ICD-10-CM   1. Recurrent major depressive disorder, in partial remission (HCC)  F33.41 ARIPiprazole (ABILIFY) 10 MG tablet      Past Psychiatric History: Depression and anxiety.   Past Medical History:  Past Medical History:  Diagnosis Date   Anxiety    (f/b by psych)   Hypertension    Migraine    Smoker    former    Past Surgical History:  Procedure Laterality Date   INCISION AND DRAINAGE ABSCESS ANAL  04/10/2011   Dr Daphine Deutscher   INCISION AND DRAINAGE ABSCESS ANAL  May 2013   Dr Magnus Ivan   INCISION AND DRAINAGE ABSCESS ANAL  09/15/2012   Dr Biagio Quint   LAPAROSCOPIC APPENDECTOMY N/A 09/08/2015   Procedure: APPENDECTOMY LAPAROSCOPIC;  Surgeon: Chevis Pretty III, MD;  Location: [facility];  Service: General;  Laterality: N/A;    Family Psychiatric History: Father Bipolar disoder  Family History:  Family History  Problem Relation Age of Onset   COPD Father    Heart disease Sister        congenital    Social History:  Social History   Socioeconomic History   Marital status: Married    Spouse name: Not on file   Number of  children: 1   Years of education: Not on file   Highest education level: Not on file  Occupational History   Occupation: Forensic scientist: [employer]  Tobacco Use   Smoking status: Former    Packs/day: 1.00    Types: Cigarettes    Quit date: 03/29/2011    Years since quitting: 10.4   Smokeless tobacco: Never  Vaping Use   Vaping Use: Never used  Substance and Sexual Activity   Alcohol use: No   Drug use: No   Sexual activity: Yes  Other Topics Concern   Not on file  Social History Narrative   Not on file   Social Determinants of Health   Financial Resource Strain: Not on file  Food Insecurity: Not on file   Transportation Needs: Not on file  Physical Activity: Not on file  Stress: Not on file  Social Connections: Not on file    Allergies:  Allergies  Allergen Reactions   Effexor [Venlafaxine Hydrochloride] Swelling and Other (See Comments)    Throat swelling, blurred vision    Metabolic Disorder Labs: Lab Results  Component Value Date   HGBA1C 5.7 (H) 12/16/2015   MPG 117 12/16/2015   MPG 117 (H) 07/18/2015   Lab Results  Component Value Date   PROLACTIN 7.5 07/25/2015   No results found for: CHOL, TRIG, HDL, CHOLHDL, VLDL, LDLCALC Lab Results  Component Value Date   TSH 1.050 07/18/2015   TSH 1.295 10/20/2013    Therapeutic Level Labs: No results found for: LITHIUM No results found for: VALPROATE No components found for:  CBMZ  Current Medications: Current Outpatient Medications  Medication Sig Dispense Refill   acetaminophen (TYLENOL) 325 MG tablet Take 650 mg by mouth every 6 (six) hours as needed for moderate pain.     ARIPiprazole (ABILIFY) 10 MG tablet Take 1 tablet (10 mg total) by mouth at bedtime. 90 tablet 1   ibuprofen (ADVIL) 200 MG tablet Take 600 mg by mouth every 6 (six) hours as needed for moderate pain.     lisinopril-hydrochlorothiazide (PRINZIDE,ZESTORETIC) 10-12.5 MG tablet TAKE ONE TABLET BY MOUTH ONE TIME DAILY **NEW INS CARD?? (Patient not taking: No sig reported) 90 tablet 1   meclizine (ANTIVERT) 25 MG tablet Take 1 tablet (25 mg total) by mouth 3 (three) times daily as needed for dizziness. 30 tablet 0   OVER THE COUNTER MEDICATION Take 6 mg by mouth as needed (dipping sensation). Zyn nicotine pouch     No current facility-administered medications for this visit.     Musculoskeletal: Strength & Muscle Tone:  Unable to assess due to telephone visit Gait & Station:  Unable to assess due to telephone visit Patient leans:  Unable to assess due to telephone visit  Psychiatric Specialty Exam: Review of Systems  There were no vitals taken for  this visit.There is no height or weight on file to calculate BMI.  General Appearance:  Unable to assess due to telephone visit  Eye Contact:  Good  Speech:  Clear and Coherent and Normal Rate  Volume:  Normal  Mood:  Euthymic  Affect:  Appropriate and Congruent  Thought Process:  Coherent, Goal Directed and Linear  Orientation:  Full (Time, Place, and Person)  Thought Content: WDL and Logical   Suicidal Thoughts:  No  Homicidal Thoughts:  No  Memory:  Immediate;   Good Recent;   Good Remote;   Good  Judgement:  Good  Insight:  Good  Psychomotor Activity:  Unable to assess due to telephone visit  Concentration:  Concentration: Good and Attention Span: Good  Recall:  Good  Fund of Knowledge: Good  Language: Good  Akathisia:  No  Handed:  Right  AIMS (if indicated): Not done   Assets:  Communication Skills Desire for Improvement Financial Resources/Insurance Housing Intimacy Social Support  ADL's:  Intact  Cognition: WNL  Sleep:  Good   Screenings: GAD-7    Flowsheet Row Video Visit from 08/27/2021 in [facility] Video Visit from 05/21/2021 in [facility] Video Visit from 02/19/2021 in [facility] Video Visit from 11/26/2020 in [facility] Video Visit from 08/27/2020 in [facility]  Total GAD-7 Score 4 4 3 4 4       PHQ2-9    Flowsheet Row Video Visit from 08/27/2021 in [facility] Video Visit from 05/21/2021 in [facility] Video Visit from 02/19/2021 in [facility] Video Visit from 11/26/2020 in [facility] Video Visit from 08/27/2020 in [facility]  PHQ-2 Total Score 0 0 2 0 0  PHQ-9 Total Score 1 1 3 1  0      Flowsheet Row Video Visit from 11/26/2020 in [facility] ED  from 11/18/2020 in [facility]  C-SSRS RISK CATEGORY No Risk No Risk        Assessment and Plan: Patient notes that at night he has numbness in his arms and hands. He denies chest pain or SOB. Provider recommended patient follow up with his PCP. He notes mentally he is doing well on his current medication regimen.  No medication changes made today.  Patient agreeable to continue all medications as prescribed.  1.  Recurrent major depressive disorder, in partial remission (HCC)  Continue- ARIPiprazole (ABILIFY) 10 MG tablet; Take 1 tablet (10 mg total) by mouth at bedtime.  Dispense: 90 tablet; Refill: 1   Follow-up in 3 months   [facility], NP 08/27/2021, 3:22 PM

## 2021-11-19 ENCOUNTER — Encounter (HOSPITAL_COMMUNITY): Payer: Self-pay

## 2021-11-19 ENCOUNTER — Telehealth (HOSPITAL_COMMUNITY): Payer: No Payment, Other | Admitting: Psychiatry

## 2022-03-14 ENCOUNTER — Other Ambulatory Visit (HOSPITAL_COMMUNITY): Payer: Self-pay | Admitting: Psychiatry

## 2022-03-14 DIAGNOSIS — F3341 Major depressive disorder, recurrent, in partial remission: Secondary | ICD-10-CM

## 2022-03-17 ENCOUNTER — Encounter (HOSPITAL_COMMUNITY): Payer: Self-pay | Admitting: Psychiatry

## 2022-03-17 ENCOUNTER — Telehealth (INDEPENDENT_AMBULATORY_CARE_PROVIDER_SITE_OTHER): Payer: No Payment, Other | Admitting: Psychiatry

## 2022-03-17 DIAGNOSIS — F3341 Major depressive disorder, recurrent, in partial remission: Secondary | ICD-10-CM

## 2022-03-17 MED ORDER — ARIPIPRAZOLE 10 MG PO TABS: 10.0000 mg | ORAL_TABLET | Freq: Every day | ORAL | 1 refills | Status: DC

## 2022-03-17 NOTE — Progress Notes (Signed)
BH MD/PA/NP OP Progress Note Virtual Visit via Telephone Note  I connected with Roberto Bow. on 03/17/22 at 10:30 AM EDT by telephone and verified that I am speaking with the correct person using two identifiers.  Location: Patient: home Provider: Clinic   I discussed the limitations, risks, security and privacy concerns of performing an evaluation and management service by telephone and the availability of in person appointments. I also discussed with the patient that there may be a patient responsible charge related to this service. The patient expressed understanding and agreed to proceed.   I provided 30 minutes of non-face-to-face time during this encounter.             03/17/2022 10:43 AM Roberto Bow.  MRN:  [id number]  Chief Complaint: "I've been just fine"  HPI: 41 year old male seen today for follow up psychiatric evaluation. He has a psychiatric history or anxiety and depression.   He is currently managed on Abilify 10 mg daily and reports that it is effective to conditions.  Today he was unable to login virtually so his exam was done over the phone. During exam he was is pleasant, calm, cooperative, and engaged in conversation.  He informed provider that has been doing just fine.  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  Patient notes that his mood is stable and reports that he has minimal anxiety and depression.  Today Provider conducted a GAD 7 and patient scored a 3.  Provider also conducted a PHQ-9 and patient scored a 3.  Today he endorses adequate sleep and appetite.  He denies SI/HI/VAH, mania, or paranoia.  No medication adjustments made today. Patient is agreeable to continue all medications as prescribed. No other concerns noted at this time.   Visit Diagnosis:    ICD-10-CM   1. Recurrent major depressive disorder, in partial remission (HCC)  F33.41 ARIPiprazole (ABILIFY) 10  MG tablet      Past Psychiatric History: Depression and anxiety.   Past Medical History:  Past Medical History:  Diagnosis Date   Anxiety    (f/b by psych)   Hypertension    Migraine    Smoker    former    Past Surgical History:  Procedure Laterality Date   INCISION AND DRAINAGE ABSCESS ANAL  04/10/2011   Dr Daphine Deutscher   INCISION AND DRAINAGE ABSCESS ANAL  May 2013   Dr Magnus Ivan   INCISION AND DRAINAGE ABSCESS ANAL  09/15/2012   Dr Biagio Quint   LAPAROSCOPIC APPENDECTOMY N/A 09/08/2015   Procedure: APPENDECTOMY LAPAROSCOPIC;  Surgeon: Chevis Pretty III, MD;  Location: [facility];  Service: General;  Laterality: N/A;    Family Psychiatric History: Father Bipolar disoder  Family History:  Family History  Problem Relation Age of Onset   COPD Father    Heart disease Sister        congenital    Social History:  Social History   Socioeconomic History   Marital status: Married    Spouse name: Not on file   Number of children: 1   Years of education: Not on file   Highest education level: Not on file  Occupational History   Occupation: Forensic scientist: [employer]  Tobacco Use   Smoking status: Former    Packs/day: 1.00    Types: Cigarettes    Quit date: 03/29/2011    Years since quitting: 10.9   Smokeless tobacco: Never  Vaping Use   Vaping Use: Never used  Substance and Sexual Activity   Alcohol use: No   Drug use: No   Sexual activity: Yes  Other Topics Concern   Not on file  Social History Narrative   Not on file   Social Determinants of Health   Financial Resource Strain: Not on file  Food Insecurity: Not on file  Transportation Needs: Not on file  Physical Activity: Not on file  Stress: Not on file  Social Connections: Not on file    Allergies:  Allergies  Allergen Reactions   Effexor [Venlafaxine Hydrochloride] Swelling and Other (See Comments)    Throat swelling, blurred vision    Metabolic Disorder Labs: Lab Results  Component  Value Date   HGBA1C 5.7 (H) 12/16/2015   MPG 117 12/16/2015   MPG 117 (H) 07/18/2015   Lab Results  Component Value Date   PROLACTIN 7.5 07/25/2015   No results found for: "CHOL", "TRIG", "HDL", "CHOLHDL", "VLDL", "LDLCALC" Lab Results  Component Value Date   TSH 1.050 07/18/2015   TSH 1.295 10/20/2013    Therapeutic Level Labs: No results found for: "LITHIUM" No results found for: "VALPROATE" No results found for: "CBMZ"  Current Medications: Current Outpatient Medications  Medication Sig Dispense Refill   acetaminophen (TYLENOL) 325 MG tablet Take 650 mg by mouth every 6 (six) hours as needed for moderate pain.     ARIPiprazole (ABILIFY) 10 MG tablet Take 1 tablet (10 mg total) by mouth at bedtime. 90 tablet 1   ibuprofen (ADVIL) 200 MG tablet Take 600 mg by mouth every 6 (six) hours as needed for moderate pain.     lisinopril-hydrochlorothiazide (PRINZIDE,ZESTORETIC) 10-12.5 MG tablet TAKE ONE TABLET BY MOUTH ONE TIME DAILY **NEW INS CARD?? (Patient not taking: No sig reported) 90 tablet 1   meclizine (ANTIVERT) 25 MG tablet Take 1 tablet (25 mg total) by mouth 3 (three) times daily as needed for dizziness. 30 tablet 0   OVER THE COUNTER MEDICATION Take 6 mg by mouth as needed (dipping sensation). Zyn nicotine pouch     No current facility-administered medications for this visit.     Musculoskeletal: Strength & Muscle Tone:  Unable to assess due to telephone visit Gait & Station:  Unable to assess due to telephone visit Patient leans:  Unable to assess due to telephone visit  Psychiatric Specialty Exam: Review of Systems  There were no vitals taken for this visit.There is no height or weight on file to calculate BMI.  General Appearance:  Unable to assess due to telephone visit  Eye Contact:  Good  Speech:  Clear and Coherent and Normal Rate  Volume:  Normal  Mood:  Euthymic  Affect:  Appropriate and Congruent  Thought Process:  Coherent, Goal Directed and Linear   Orientation:  Full (Time, Place, and Person)  Thought Content: WDL and Logical   Suicidal Thoughts:  No  Homicidal Thoughts:  No  Memory:  Immediate;   Good Recent;   Good Remote;   Good  Judgement:  Good  Insight:  Good  Psychomotor Activity:   Unable to assess due to telephone visit  Concentration:  Concentration: Good and Attention Span: Good  Recall:  Good  Fund of Knowledge: Good  Language: Good  Akathisia:  No  Handed:  Right  AIMS (if indicated): Not done   Assets:  Communication Skills Desire for Improvement Financial Resources/Insurance Housing Intimacy Social Support  ADL's:  Intact  Cognition: WNL  Sleep:  Good  Screenings: GAD-7    Flowsheet Row Video Visit from 03/17/2022 in [facility] Video Visit from 08/27/2021 in [facility] Video Visit from 05/21/2021 in [facility] Video Visit from 02/19/2021 in [facility] Video Visit from 11/26/2020 in [facility]  Total GAD-7 Score 3 4 4 3 4       PHQ2-9    Flowsheet Row Video Visit from 03/17/2022 in [facility] Video Visit from 08/27/2021 in [facility] Video Visit from 05/21/2021 in [facility] Video Visit from 02/19/2021 in [facility] Video Visit from 11/26/2020 in [facility]  PHQ-2 Total Score 2 0 0 2 0  PHQ-9 Total Score 3 1 1 3 1       Flowsheet Row Video Visit from 11/26/2020 in [facility] ED from 11/18/2020 in [facility]  C-SSRS RISK CATEGORY No Risk No Risk        Assessment and Plan: Patient notes that he is doing well on his current medication regimen.  No medication changes made today.  Patient agreeable to continue all medications as prescribed.  1.  Recurrent  major depressive disorder, in partial remission (HCC)  Continue- ARIPiprazole (ABILIFY) 10 MG tablet; Take 1 tablet (10 mg total) by mouth at bedtime.  Dispense: 90 tablet; Refill: 1   Follow-up in 3 months   Shanna Cisco, NP 03/17/2022, 10:43 AM

## 2022-04-03 DIAGNOSIS — Z0001 Encounter for general adult medical examination with abnormal findings: Secondary | ICD-10-CM | POA: Diagnosis not present

## 2022-04-03 DIAGNOSIS — R7309 Other abnormal glucose: Secondary | ICD-10-CM | POA: Diagnosis not present

## 2022-04-28 DIAGNOSIS — S39012A Strain of muscle, fascia and tendon of lower back, initial encounter: Secondary | ICD-10-CM | POA: Diagnosis not present

## 2022-04-28 DIAGNOSIS — M461 Sacroiliitis, not elsewhere classified: Secondary | ICD-10-CM | POA: Diagnosis not present

## 2022-04-30 DIAGNOSIS — G603 Idiopathic progressive neuropathy: Secondary | ICD-10-CM | POA: Diagnosis not present

## 2022-04-30 DIAGNOSIS — E559 Vitamin D deficiency, unspecified: Secondary | ICD-10-CM | POA: Diagnosis not present

## 2022-04-30 DIAGNOSIS — R634 Abnormal weight loss: Secondary | ICD-10-CM | POA: Diagnosis not present

## 2022-04-30 DIAGNOSIS — G609 Hereditary and idiopathic neuropathy, unspecified: Secondary | ICD-10-CM | POA: Diagnosis not present

## 2022-04-30 DIAGNOSIS — M5417 Radiculopathy, lumbosacral region: Secondary | ICD-10-CM | POA: Diagnosis not present

## 2022-04-30 DIAGNOSIS — E538 Deficiency of other specified B group vitamins: Secondary | ICD-10-CM | POA: Diagnosis not present

## 2022-05-22 DIAGNOSIS — J019 Acute sinusitis, unspecified: Secondary | ICD-10-CM | POA: Diagnosis not present

## 2022-05-22 DIAGNOSIS — J208 Acute bronchitis due to other specified organisms: Secondary | ICD-10-CM | POA: Diagnosis not present

## 2022-05-29 DIAGNOSIS — J208 Acute bronchitis due to other specified organisms: Secondary | ICD-10-CM | POA: Diagnosis not present

## 2022-06-17 ENCOUNTER — Telehealth (INDEPENDENT_AMBULATORY_CARE_PROVIDER_SITE_OTHER): Payer: No Payment, Other | Admitting: Psychiatry

## 2022-06-17 ENCOUNTER — Encounter (HOSPITAL_COMMUNITY): Payer: Self-pay | Admitting: Psychiatry

## 2022-06-17 DIAGNOSIS — F3341 Major depressive disorder, recurrent, in partial remission: Secondary | ICD-10-CM | POA: Diagnosis not present

## 2022-06-17 MED ORDER — ARIPIPRAZOLE 10 MG PO TABS: 10.0000 mg | ORAL_TABLET | Freq: Every day | ORAL | 1 refills | Status: DC

## 2022-06-17 NOTE — Progress Notes (Signed)
BH MD/PA/NP OP Progress Note Virtual Visit via Telephone Note  I connected with Roberto Bow. on 06/17/22 at 10:00 AM EDT by telephone and verified that I am speaking with the correct person using two identifiers.  Location: Patient: home Provider: Clinic   I discussed the limitations, risks, security and privacy concerns of performing an evaluation and management service by telephone and the availability of in person appointments. I also discussed with the patient that there may be a patient responsible charge related to this service. The patient expressed understanding and agreed to proceed.   I provided 30 minutes of non-face-to-face time during this encounter.             06/17/2022 10:22 AM Roberto Bow.  MRN:  [id number]  Chief Complaint: "I've been really busy"  HPI: 41 year old male seen today for follow up psychiatric evaluation. He has a psychiatric history or anxiety and depression.   He is currently managed on Abilify 10 mg daily and reports that it is effective to conditions.  Today he was unable to login virtually so his exam was done over the phone. During exam he was is pleasant, calm, cooperative, and engaged in conversation.  He informed Clinical research associate that he has been staying busy at work.  Patient notes that currently he is in [employer] working.  He informed Clinical research associate that he finds enjoyment in his job.  Patient informed writer that his mood is stable and he has minimal anxiety and depression.  Provider conducted a GAD-7 and patient scored a 4, at his last visit he scored a 3.  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  Provider also conducted PHQ-9 he scored a 0, at his last visit he scored a 3.  Today he endorses adequate sleep and appetite.  He denies SI/HI/VAH, mania, or paranoia.  No medication adjustments made today. Patient is agreeable to continue all medications as prescribed. No other  concerns noted at this time.   Visit Diagnosis:    ICD-10-CM   1. Recurrent major depressive disorder, in partial remission (HCC)  F33.41 ARIPiprazole (ABILIFY) 10 MG tablet      Past Psychiatric History: Depression and anxiety.   Past Medical History:  Past Medical History:  Diagnosis Date   Anxiety    (f/b by psych)   Hypertension    Migraine    Smoker    former    Past Surgical History:  Procedure Laterality Date   INCISION AND DRAINAGE ABSCESS ANAL  04/10/2011   Dr Daphine Deutscher   INCISION AND DRAINAGE ABSCESS ANAL  May 2013   Dr Magnus Ivan   INCISION AND DRAINAGE ABSCESS ANAL  09/15/2012   Dr Biagio Quint   LAPAROSCOPIC APPENDECTOMY N/A 09/08/2015   Procedure: APPENDECTOMY LAPAROSCOPIC;  Surgeon: Chevis Pretty III, MD;  Location: [facility];  Service: General;  Laterality: N/A;    Family Psychiatric History: Father Bipolar disoder  Family History:  Family History  Problem Relation Age of Onset   COPD Father    Heart disease Sister        congenital    Social History:  Social History   Socioeconomic History   Marital status: Married    Spouse name: Not on file   Number of children: 1   Years of education: Not on file   Highest education level: Not on file  Occupational History   Occupation: Forensic scientist: [employer]  Tobacco Use   Smoking status: Former    Packs/day: 1.00    Types: Cigarettes    Quit date: 03/29/2011    Years since quitting: 11.2   Smokeless tobacco: Never  Vaping Use   Vaping Use: Never used  Substance and Sexual Activity   Alcohol use: No   Drug use: No   Sexual activity: Yes  Other Topics Concern   Not on file  Social History Narrative   Not on file   Social Determinants of Health   Financial Resource Strain: Not on file  Food Insecurity: Not on file  Transportation Needs: Not on file  Physical Activity: Not on file  Stress: Not on file  Social Connections: Not on file    Allergies:  Allergies  Allergen Reactions    Effexor [Venlafaxine Hydrochloride] Swelling and Other (See Comments)    Throat swelling, blurred vision    Metabolic Disorder Labs: Lab Results  Component Value Date   HGBA1C 5.7 (H) 12/16/2015   MPG 117 12/16/2015   MPG 117 (H) 07/18/2015   Lab Results  Component Value Date   PROLACTIN 7.5 07/25/2015   No results found for: "CHOL", "TRIG", "HDL", "CHOLHDL", "VLDL", "LDLCALC" Lab Results  Component Value Date   TSH 1.050 07/18/2015   TSH 1.295 10/20/2013    Therapeutic Level Labs: No results found for: "LITHIUM" No results found for: "VALPROATE" No results found for: "CBMZ"  Current Medications: Current Outpatient Medications  Medication Sig Dispense Refill   acetaminophen (TYLENOL) 325 MG tablet Take 650 mg by mouth every 6 (six) hours as needed for moderate pain.     ARIPiprazole (ABILIFY) 10 MG tablet Take 1 tablet (10 mg total) by mouth at bedtime. 90 tablet 1   ibuprofen (ADVIL) 200 MG tablet Take 600 mg by mouth every 6 (six) hours as needed for moderate pain.     lisinopril-hydrochlorothiazide (PRINZIDE,ZESTORETIC) 10-12.5 MG tablet TAKE ONE TABLET BY MOUTH ONE TIME DAILY **NEW INS CARD?? (Patient not taking: No sig reported) 90 tablet 1   meclizine (ANTIVERT) 25 MG tablet Take 1 tablet (25 mg total) by mouth 3 (three) times daily as needed for dizziness. 30 tablet 0   OVER THE COUNTER MEDICATION Take 6 mg by mouth as needed (dipping sensation). Zyn nicotine pouch     No current facility-administered medications for this visit.     Musculoskeletal: Strength & Muscle Tone:  Unable to assess due to telephone visit Gait & Station:  Unable to assess due to telephone visit Patient leans:  Unable to assess due to telephone visit  Psychiatric Specialty Exam: Review of Systems  There were no vitals taken for this visit.There is no height or weight on file to calculate BMI.  General Appearance:  Unable to assess due to telephone visit  Eye Contact:  Good  Speech:   Clear and Coherent and Normal Rate  Volume:  Normal  Mood:  Euthymic  Affect:  Appropriate and Congruent  Thought Process:  Coherent, Goal Directed and Linear  Orientation:  Full (Time, Place, and Person)  Thought Content: WDL and Logical   Suicidal Thoughts:  No  Homicidal Thoughts:  No  Memory:  Immediate;   Good Recent;   Good Remote;   Good  Judgement:  Good  Insight:  Good  Psychomotor Activity:   Unable to assess due to telephone visit  Concentration:  Concentration: Good and Attention Span: Good  Recall:  Good  Fund of Knowledge: Good  Language: Good  Akathisia:  No  Handed:  Right  AIMS (if indicated): Not done   Assets:  Communication Skills Desire for Improvement Financial Resources/Insurance Housing Intimacy Social Support  ADL's:  Intact  Cognition: WNL  Sleep:  Good   Screenings: GAD-7    Flowsheet Row Video Visit from 06/17/2022 in [facility] Video Visit from 03/17/2022 in [facility] Video Visit from 08/27/2021 in [facility] Video Visit from 05/21/2021 in [facility] Video Visit from 02/19/2021 in [facility]  Total GAD-7 Score 4 3 4 4 3       PHQ2-9    Flowsheet Row Video Visit from 06/17/2022 in [facility] Video Visit from 03/17/2022 in [facility] Video Visit from 08/27/2021 in [facility] Video Visit from 05/21/2021 in [facility] Video Visit from 02/19/2021 in [facility]  PHQ-2 Total Score 0 2 0 0 2  PHQ-9 Total Score 0 3 1 1 3       Flowsheet Row Video Visit from 11/26/2020 in [facility] ED from 11/18/2020 in [facility]Y [facility]  C-SSRS RISK CATEGORY No Risk No Risk        Assessment and Plan: Patient notes that he  is doing well on his current medication regimen.  No medication changes made today.  Patient agreeable to continue all medications as prescribed.  1.  Recurrent major depressive disorder, in partial remission (HCC)  Continue- ARIPiprazole (ABILIFY) 10 MG tablet; Take 1 tablet (10 mg total) by mouth at bedtime.  Dispense: 90 tablet; Refill: 1   Follow-up in 3 months   [facility], NP 06/17/2022, 10:22 AM

## 2022-08-09 ENCOUNTER — Emergency Department (HOSPITAL_BASED_OUTPATIENT_CLINIC_OR_DEPARTMENT_OTHER): Payer: BC Managed Care – PPO

## 2022-08-09 ENCOUNTER — Encounter (HOSPITAL_BASED_OUTPATIENT_CLINIC_OR_DEPARTMENT_OTHER): Payer: Self-pay

## 2022-08-09 ENCOUNTER — Emergency Department (HOSPITAL_BASED_OUTPATIENT_CLINIC_OR_DEPARTMENT_OTHER)
Admission: EM | Admit: 2022-08-09 | Discharge: 2022-08-09 | Disposition: A | Discharge status: Home / Self Care | Payer: BC Managed Care – PPO | Attending: Emergency Medicine | Admitting: Emergency Medicine

## 2022-08-09 ENCOUNTER — Emergency Department (HOSPITAL_BASED_OUTPATIENT_CLINIC_OR_DEPARTMENT_OTHER): Payer: BC Managed Care – PPO | Admitting: Radiology

## 2022-08-09 ENCOUNTER — Other Ambulatory Visit: Payer: Self-pay

## 2022-08-09 DIAGNOSIS — J45909 Unspecified asthma, uncomplicated: Secondary | ICD-10-CM | POA: Insufficient documentation

## 2022-08-09 DIAGNOSIS — K122 Cellulitis and abscess of mouth: Secondary | ICD-10-CM | POA: Insufficient documentation

## 2022-08-09 DIAGNOSIS — J101 Influenza due to other identified influenza virus with other respiratory manifestations: Secondary | ICD-10-CM | POA: Insufficient documentation

## 2022-08-09 DIAGNOSIS — Z20822 Contact with and (suspected) exposure to covid-19: Secondary | ICD-10-CM | POA: Diagnosis not present

## 2022-08-09 DIAGNOSIS — D72829 Elevated white blood cell count, unspecified: Secondary | ICD-10-CM | POA: Insufficient documentation

## 2022-08-09 DIAGNOSIS — R059 Cough, unspecified: Secondary | ICD-10-CM | POA: Diagnosis not present

## 2022-08-09 DIAGNOSIS — J111 Influenza due to unidentified influenza virus with other respiratory manifestations: Secondary | ICD-10-CM

## 2022-08-09 DIAGNOSIS — R0602 Shortness of breath: Secondary | ICD-10-CM | POA: Diagnosis not present

## 2022-08-09 LAB — RESP PANEL BY RT-PCR (FLU A&B, COVID) ARPGX2
Influenza A by PCR: POSITIVE — AB
Influenza B by PCR: NEGATIVE
SARS Coronavirus 2 by RT PCR: NEGATIVE

## 2022-08-09 LAB — CBC WITH DIFFERENTIAL/PLATELET
Abs Immature Granulocytes: 0.04 10*3/uL (ref 0.00–0.07)
Basophils Absolute: 0.1 10*3/uL (ref 0.0–0.1)
Basophils Relative: 0 %
Eosinophils Absolute: 0.1 10*3/uL (ref 0.0–0.5)
Eosinophils Relative: 1 %
HCT: 43.9 % (ref 39.0–52.0)
Hemoglobin: 14.7 g/dL (ref 13.0–17.0)
Immature Granulocytes: 0 %
Lymphocytes Relative: 17 %
Lymphs Abs: 2.3 10*3/uL (ref 0.7–4.0)
MCH: 30.9 pg (ref 26.0–34.0)
MCHC: 33.5 g/dL (ref 30.0–36.0)
MCV: 92.4 fL (ref 80.0–100.0)
Monocytes Absolute: 1.4 10*3/uL — ABNORMAL HIGH (ref 0.1–1.0)
Monocytes Relative: 10 %
Neutro Abs: 10 10*3/uL — ABNORMAL HIGH (ref 1.7–7.7)
Neutrophils Relative %: 72 %
Platelets: 219 10*3/uL (ref 150–400)
RBC: 4.75 MIL/uL (ref 4.22–5.81)
RDW: 12.8 % (ref 11.5–15.5)
WBC: 13.9 10*3/uL — ABNORMAL HIGH (ref 4.0–10.5)
nRBC: 0 % (ref 0.0–0.2)

## 2022-08-09 LAB — BASIC METABOLIC PANEL
Anion gap: 10 (ref 5–15)
BUN: 17 mg/dL (ref 6–20)
CO2: 26 mmol/L (ref 22–32)
Calcium: 9.3 mg/dL (ref 8.9–10.3)
Chloride: 101 mmol/L (ref 98–111)
Creatinine, Ser: 1.08 mg/dL (ref 0.61–1.24)
GFR, Estimated: >60 mL/min (ref >60)
Glucose, Bld: 102 mg/dL — ABNORMAL HIGH (ref 70–99)
Potassium: 3.3 mmol/L — ABNORMAL LOW (ref 3.5–5.1)
Sodium: 137 mmol/L (ref 135–145)

## 2022-08-09 MED ORDER — AMOXICILLIN 500 MG PO CAPS: 500.0000 mg | ORAL_CAPSULE | Freq: Three times a day (TID) | ORAL | 0 refills | Status: DC

## 2022-08-09 MED ORDER — DEXAMETHASONE SODIUM PHOSPHATE 10 MG/ML IJ SOLN
10.0000 mg | Freq: Once | INTRAMUSCULAR | Status: AC
  Administered 2022-08-09: 10 mg via INTRAMUSCULAR
  Filled 2022-08-09: qty 1

## 2022-08-09 MED ORDER — AMOXICILLIN 500 MG PO CAPS
500.0000 mg | ORAL_CAPSULE | Freq: Once | ORAL | Status: AC
  Administered 2022-08-09: 500 mg via ORAL
  Filled 2022-08-09: qty 1

## 2022-08-09 MED ORDER — AEROCHAMBER PLUS FLO-VU MEDIUM MISC
1.0000 | Freq: Once | Status: AC
  Administered 2022-08-09: 1
  Filled 2022-08-09: qty 1

## 2022-08-09 MED ORDER — OSELTAMIVIR PHOSPHATE 75 MG PO CAPS: 75.0000 mg | ORAL_CAPSULE | Freq: Two times a day (BID) | ORAL | 0 refills | Status: DC

## 2022-08-09 MED ORDER — ALBUTEROL SULFATE HFA 108 (90 BASE) MCG/ACT IN AERS
2.0000 | INHALATION_SPRAY | RESPIRATORY_TRACT | Status: DC | PRN
  Administered 2022-08-09: 2 via RESPIRATORY_TRACT
  Filled 2022-08-09: qty 6.7

## 2022-08-09 NOTE — ED Provider Notes (Signed)
[facility] EMERGENCY DEPT Provider Note   CSN: [id number] Arrival date & time: 08/09/22  1305     History  Chief Complaint  Patient presents with   Cough    Roberto Lewis. is a 41 y.o. male.  Patient reports congestion and sore throat, body aches, chills and cough for the past 3 days.  States symptoms started while he was on a deer trip.  Has had some shortness of breath with coughing.  Does have a history of asthma.  Does not normally take any breathing medications.  Did not check his temperature at home but felt warm.  Denies any vomiting or diarrhea.  Feels like he is not short of breath currently.  No chest pain.  No travel or sick contacts.  Did not check his temperature at home but has felt like he might have had a fever.  The history is provided by the patient.  Cough Associated symptoms: fever, headaches, myalgias, rhinorrhea and sore throat   Associated symptoms: no chest pain and no rash        Home Medications Prior to Admission medications   Medication Sig Start Date End Date Taking? Authorizing Provider  acetaminophen (TYLENOL) 325 MG tablet Take 650 mg by mouth every 6 (six) hours as needed for moderate pain.    [provider]  ARIPiprazole (ABILIFY) 10 MG tablet Take 1 tablet (10 mg total) by mouth at bedtime. 06/17/22   Shanna Cisco, NP  ibuprofen (ADVIL) 200 MG tablet Take 600 mg by mouth every 6 (six) hours as needed for moderate pain.    [provider]  lisinopril-hydrochlorothiazide (PRINZIDE,ZESTORETIC) 10-12.5 MG tablet TAKE ONE TABLET BY MOUTH ONE TIME DAILY **NEW INS CARD?? Patient not taking: No sig reported 12/09/15   Ronnald Nian, MD  meclizine (ANTIVERT) 25 MG tablet Take 1 tablet (25 mg total) by mouth 3 (three) times daily as needed for dizziness. 11/18/20   Fayrene Helper, PA-C  OVER THE COUNTER MEDICATION Take 6 mg by mouth as needed (dipping sensation). Zyn nicotine pouch    [provider]       Allergies    Effexor [venlafaxine hydrochloride]    Review of Systems   Review of Systems  Constitutional:  Positive for activity change, appetite change, fatigue and fever.  HENT:  Positive for rhinorrhea and sore throat. Negative for congestion.   Respiratory:  Positive for cough. Negative for choking.   Cardiovascular:  Negative for chest pain.  Gastrointestinal:  Negative for abdominal pain, nausea and vomiting.  Genitourinary:  Negative for dysuria and hematuria.  Musculoskeletal:  Positive for arthralgias and myalgias.  Skin:  Negative for rash.  Neurological:  Positive for headaches. Negative for weakness.   all other systems are negative except as noted in the HPI and PMH.    Physical Exam Updated Vital Signs BP (!) 147/92   Pulse (!) 108   Temp 99.6 F (37.6 C) (Oral)   Resp 18   Ht 6' (1.829 m)   Wt 113.4 kg   SpO2 98%   BMI 33.91 kg/m  Physical Exam Vitals and nursing note reviewed.  Constitutional:      General: He is not in acute distress.    Appearance: He is well-developed. He is not ill-appearing.  HENT:     Head: Normocephalic and atraumatic.     Mouth/Throat:     Pharynx: No oropharyngeal exudate.     Comments: Uvula is edematous and enlarged.  Controlling secretions Eyes:  Conjunctiva/sclera: Conjunctivae normal.     Pupils: Pupils are equal, round, and reactive to light.  Neck:     Comments: No meningismus. Cardiovascular:     Rate and Rhythm: Normal rate and regular rhythm.     Heart sounds: Normal heart sounds. No murmur heard. Pulmonary:     Effort: Pulmonary effort is normal. No respiratory distress.     Breath sounds: Normal breath sounds.  Chest:     Chest wall: No tenderness.  Abdominal:     Palpations: Abdomen is soft.     Tenderness: There is no abdominal tenderness. There is no guarding or rebound.  Musculoskeletal:        General: No tenderness. Normal range of motion.     Cervical back: Normal range of motion and neck  supple.  Skin:    General: Skin is warm.     Capillary Refill: Capillary refill takes less than 2 seconds.  Neurological:     Mental Status: He is alert and oriented to person, place, and time.     Cranial Nerves: No cranial nerve deficit.     Motor: No abnormal muscle tone.     Coordination: Coordination normal.     Comments: No ataxia on finger to nose bilaterally. No pronator drift. 5/5 strength throughout. CN 2-12 intact.Equal grip strength. Sensation intact.   Psychiatric:        Mood and Affect: Mood normal.        Behavior: Behavior normal.        Thought Content: Thought content normal.     ED Results / Procedures / Treatments   Labs (all labs ordered are listed, but only abnormal results are displayed) Labs Reviewed  RESP PANEL BY RT-PCR (FLU A&B, COVID) ARPGX2 - Abnormal; Notable for the following components:      Result Value   Influenza A by PCR POSITIVE (*)    All other components within normal limits  CBC WITH DIFFERENTIAL/PLATELET - Abnormal; Notable for the following components:   WBC 13.9 (*)    Neutro Abs 10.0 (*)    Monocytes Absolute 1.4 (*)    All other components within normal limits  BASIC METABOLIC PANEL - Abnormal; Notable for the following components:   Potassium 3.3 (*)    Glucose, Bld 102 (*)    All other components within normal limits    EKG None  Radiology DG Neck Soft Tissue  Result Date: 08/09/2022 CLINICAL DATA:  Uvulitis, cough. EXAM: NECK SOFT TISSUES - 1+ VIEW COMPARISON:  None Available. FINDINGS: There is no evidence of retropharyngeal soft tissue swelling. Epiglottis is not well delineated on this study. The cervical airway is unremarkable and no radio-opaque foreign body identified. IMPRESSION: No evidence of retropharyngeal soft tissue swelling. Epiglottis is not well delineated on this study. Electronically Signed   By: Darliss Cheney M.D.   On: 08/09/2022 18:32   DG Chest Port 1 View  Result Date: 08/09/2022 CLINICAL DATA:   Shortness of breath and cough EXAM: PORTABLE CHEST 1 VIEW COMPARISON:  Chest radiograph dated 07/26/2015 FINDINGS: Normal lung volumes. No focal consolidations. No pleural effusion or pneumothorax. Enlarged cardiomediastinal silhouette is likely projectional. The visualized skeletal structures are unremarkable. IMPRESSION: No active disease. Electronically Signed   By: Agustin Cree M.D.   On: 08/09/2022 16:52    Procedures Procedures    Medications Ordered in ED Medications  albuterol (VENTOLIN HFA) 108 (90 Base) MCG/ACT inhaler 2 puff (2 puffs Inhalation Given 08/09/22 1724)  amoxicillin (AMOXIL) capsule  500 mg (has no administration in time range)  dexamethasone (DECADRON) injection 10 mg (has no administration in time range)  AeroChamber Plus Flo-Vu Medium MISC 1 each (1 each Other Given 08/09/22 1725)    ED Course/ Medical Decision Making/ A&P                           Medical Decision Making Amount and/or Complexity of Data Reviewed Labs: ordered. Radiology: ordered and independent interpretation performed. Decision-making details documented in ED Course. ECG/medicine tests: ordered and independent interpretation performed. Decision-making details documented in ED Course.  Risk Prescription drug management.   2 days of URI symptoms, cough, congestion, body aches, chills and cough.  No hypoxia or increased work of breathing.  Does have enlarged uvula.  No difficulty controlling secretions.  Lungs are clear.  Flu swab is positive.  Chest x-ray is negative for infiltrate. Labs show leukocytosis.   Heart rate has improved.  Patient tolerating p.o.  Soft tissue neck x-ray shows no acute findings.  He was treated for uvulitis with IM steroids and p.o. antibiotics.  No difficulty breathing or difficulty swallowing.  We will treat supportively for influenza.  Discussed p.o. hydration at home, antipyretics, Tamiflu, antibiotics for his uvula.  Return to the ED sooner with difficulty  breathing, difficulty swallowing, or other concerns.       Final Clinical Impression(s) / ED Diagnoses Final diagnoses:  None    Rx / DC Orders ED Discharge Orders     None         Cherie Lasalle, Jeannett Senior, MD 08/09/22 2039

## 2022-08-09 NOTE — ED Triage Notes (Signed)
Patient here POV from Home.  Endorses Friday PM beginning to have a Cough. Since then Symptoms progressed into Subjective Fevers, Chills, Aches, Congestion, Productive Cough. Began to have some SOB recently with the Cough.   NAD Noted during Triage. A&Ox4. GCS 15. Ambulatory.

## 2022-08-09 NOTE — ED Notes (Signed)
2nd call, no answer.

## 2022-08-09 NOTE — ED Notes (Signed)
Patient transported to imaging.

## 2022-08-09 NOTE — Discharge Instructions (Signed)
Your flu test is positive.  Use Tylenol or Motrin as needed for aches and for fever.  Take the antibiotics and antiviral medications as prescribed.  You are also being treated for infection of your uvula called uvulitis.  You were given a shot of steroids to help with the swelling.  The antibiotic should help as well.  Return to the ED with difficulty breathing, difficulty swallowing, any other concerns.

## 2022-08-19 ENCOUNTER — Emergency Department (HOSPITAL_COMMUNITY): Payer: BC Managed Care – PPO

## 2022-08-19 ENCOUNTER — Encounter (HOSPITAL_COMMUNITY): Payer: Self-pay | Admitting: Emergency Medicine

## 2022-08-19 ENCOUNTER — Other Ambulatory Visit: Payer: Self-pay

## 2022-08-19 ENCOUNTER — Emergency Department (HOSPITAL_COMMUNITY)
Admission: EM | Admit: 2022-08-19 | Discharge: 2022-08-19 | Disposition: A | Discharge status: Home / Self Care | Payer: BC Managed Care – PPO | Attending: Emergency Medicine | Admitting: Emergency Medicine

## 2022-08-19 DIAGNOSIS — J45909 Unspecified asthma, uncomplicated: Secondary | ICD-10-CM | POA: Insufficient documentation

## 2022-08-19 DIAGNOSIS — R0789 Other chest pain: Secondary | ICD-10-CM | POA: Insufficient documentation

## 2022-08-19 DIAGNOSIS — R079 Chest pain, unspecified: Secondary | ICD-10-CM | POA: Diagnosis not present

## 2022-08-19 LAB — CBC
HCT: 43.4 % (ref 39.0–52.0)
Hemoglobin: 14.7 g/dL (ref 13.0–17.0)
MCH: 30.6 pg (ref 26.0–34.0)
MCHC: 33.9 g/dL (ref 30.0–36.0)
MCV: 90.2 fL (ref 80.0–100.0)
Platelets: 293 10*3/uL (ref 150–400)
RBC: 4.81 MIL/uL (ref 4.22–5.81)
RDW: 12.2 % (ref 11.5–15.5)
WBC: 14.7 10*3/uL — ABNORMAL HIGH (ref 4.0–10.5)
nRBC: 0 % (ref 0.0–0.2)

## 2022-08-19 LAB — TROPONIN I (HIGH SENSITIVITY)
Troponin I (High Sensitivity): <2 ng/L (ref <18)
Troponin I (High Sensitivity): <2 ng/L (ref <18)

## 2022-08-19 LAB — BASIC METABOLIC PANEL
Anion gap: 9 (ref 5–15)
BUN: 19 mg/dL (ref 6–20)
CO2: 22 mmol/L (ref 22–32)
Calcium: 9.2 mg/dL (ref 8.9–10.3)
Chloride: 106 mmol/L (ref 98–111)
Creatinine, Ser: 1.1 mg/dL (ref 0.61–1.24)
GFR, Estimated: >60 mL/min (ref >60)
Glucose, Bld: 113 mg/dL — ABNORMAL HIGH (ref 70–99)
Potassium: 3.4 mmol/L — ABNORMAL LOW (ref 3.5–5.1)
Sodium: 137 mmol/L (ref 135–145)

## 2022-08-19 MED ORDER — KETOROLAC TROMETHAMINE 15 MG/ML IJ SOLN
15.0000 mg | Freq: Once | INTRAMUSCULAR | Status: AC
  Administered 2022-08-19: 15 mg via INTRAMUSCULAR
  Filled 2022-08-19: qty 1

## 2022-08-19 MED ORDER — IPRATROPIUM-ALBUTEROL 0.5-2.5 (3) MG/3ML IN SOLN
3.0000 mL | Freq: Once | RESPIRATORY_TRACT | Status: AC
  Administered 2022-08-19: 3 mL via RESPIRATORY_TRACT
  Filled 2022-08-19: qty 3

## 2022-08-19 NOTE — ED Provider Triage Note (Signed)
Emergency Medicine Provider Triage Evaluation Note  Roberto Lewis. , a 41 y.o. male  was evaluated in triage.  Pt complains of chest pain.  Chest pain began around 2 AM this morning awakening him from sleep.  There is radiation to bilateral clavicles.  Also notes tingling in bilateral hands.  Reports some feelings of shortness of breath.  Denies fever, chills, congestion, cough.  Reports taking no medication for this.  Was sent by employer due to ill-appearing status.  States that pain is worsened with taking a deep breath.  Denies history of DVT/PE/recent surgery or immobilization  Review of Systems  Positive: See above Negative:   Physical Exam  BP 136/87 (BP Location: Left Arm)   Pulse (!) 101   Temp 98.4 F (36.9 C) (Oral)   Resp 20   Ht 6' (1.829 m)   Wt 113.4 kg   SpO2 100%   BMI 33.91 kg/m  Gen:   Awake, no distress   Resp:  Normal effort  MSK:   Moves extremities without difficulty  Other:  No obvious murmurs gallops or rubs.  No obvious wheals, rales, rhonchi.  No lower extremity edema noted.  Patient tachycardic  Medical Decision Making  Medically screening exam initiated at 4:32 PM.  Appropriate orders placed.  Roberto Bow. was informed that the remainder of the evaluation will be completed by another provider, this initial triage assessment does not replace that evaluation, and the importance of remaining in the ED until their evaluation is complete.     Peter Garter, [place] 08/19/22 [phone]

## 2022-08-19 NOTE — ED Triage Notes (Signed)
Patient arrives ambulatory by POV c/o chest pain that woke him from his sleep around 2am. Pain radiates into collarbone and having bilateral hand tingling. Patient diagnosed with flu last week and reports chest congestion. Reports finished with tamiflu and amoxicillin.

## 2022-08-19 NOTE — Discharge Instructions (Signed)
Return for any problem.  ?

## 2022-08-19 NOTE — ED Provider Notes (Signed)
[facility]-EMERGENCY DEPT Provider Note   CSN: [id number] Arrival date & time: 08/19/22  1601     History  Chief Complaint  Patient presents with   Chest Pain    Roberto Lewis. is a 41 y.o. male.  41 year old male with prior medical history as detailed below presents for evaluation.  Patient complains of anterior chest tightness that began earlier this morning.  Patient reports that he was diagnosed with influenza last week.  Patient reports that he took all prescribed medications  (Tamiflu and amoxicillin) to treat his flu infection.  Patient also reports that he was treated for possible bronchospasm associated with his influenza infection.  Patient denies significant wheezing or bronchospasm today.  Patient does have a history of mild asthma.  Patient denies prior history of cardiac disease.  The history is provided by the patient and medical records.       Home Medications Prior to Admission medications   Medication Sig Start Date End Date Taking? Authorizing Provider  acetaminophen (TYLENOL) 325 MG tablet Take 650 mg by mouth every 6 (six) hours as needed for moderate pain.    [provider]  amoxicillin (AMOXIL) 500 MG capsule Take 1 capsule (500 mg total) by mouth 3 (three) times daily. 08/09/22   Rancour, Jeannett Senior, MD  ARIPiprazole (ABILIFY) 10 MG tablet Take 1 tablet (10 mg total) by mouth at bedtime. 06/17/22   Shanna Cisco, NP  ibuprofen (ADVIL) 200 MG tablet Take 600 mg by mouth every 6 (six) hours as needed for moderate pain.    [provider]  lisinopril-hydrochlorothiazide (PRINZIDE,ZESTORETIC) 10-12.5 MG tablet TAKE ONE TABLET BY MOUTH ONE TIME DAILY **NEW INS CARD?? Patient not taking: No sig reported 12/09/15   Ronnald Nian, MD  meclizine (ANTIVERT) 25 MG tablet Take 1 tablet (25 mg total) by mouth 3 (three) times daily as needed for dizziness. 11/18/20   Fayrene Helper, PA-C  oseltamivir (TAMIFLU) 75 MG capsule  Take 1 capsule (75 mg total) by mouth every 12 (twelve) hours. 08/09/22   Rancour, Jeannett Senior, MD  OVER THE COUNTER MEDICATION Take 6 mg by mouth as needed (dipping sensation). Zyn nicotine pouch    [provider]      Allergies    Effexor [venlafaxine hydrochloride]    Review of Systems   Review of Systems  All other systems reviewed and are negative.   Physical Exam Updated Vital Signs BP 136/87 (BP Location: Left Arm)   Pulse (!) 101   Temp 98.4 F (36.9 C) (Oral)   Resp 20   Ht 6' (1.829 m)   Wt 113.4 kg   SpO2 100%   BMI 33.91 kg/m  Physical Exam Vitals and nursing note reviewed.  Constitutional:      General: He is not in acute distress.    Appearance: Normal appearance. He is well-developed.  HENT:     Head: Normocephalic and atraumatic.  Eyes:     Conjunctiva/sclera: Conjunctivae normal.     Pupils: Pupils are equal, round, and reactive to light.  Cardiovascular:     Rate and Rhythm: Normal rate and regular rhythm.     Heart sounds: Normal heart sounds.  Pulmonary:     Effort: Pulmonary effort is normal. No respiratory distress.     Breath sounds: Normal breath sounds.  Abdominal:     General: There is no distension.     Palpations: Abdomen is soft.     Tenderness: There is no abdominal tenderness.  Musculoskeletal:  General: No deformity. Normal range of motion.     Cervical back: Normal range of motion and neck supple.  Skin:    General: Skin is warm and dry.  Neurological:     General: No focal deficit present.     Mental Status: He is alert and oriented to person, place, and time.     ED Results / Procedures / Treatments   Labs (all labs ordered are listed, but only abnormal results are displayed) Labs Reviewed  BASIC METABOLIC PANEL - Abnormal; Notable for the following components:      Result Value   Potassium 3.4 (*)    Glucose, Bld 113 (*)    All other components within normal limits  CBC - Abnormal; Notable for the  following components:   WBC 14.7 (*)    All other components within normal limits  TROPONIN I (HIGH SENSITIVITY)  TROPONIN I (HIGH SENSITIVITY)    EKG EKG Interpretation  Date/Time:  Wednesday August 19 2022 16:22:44 EST Ventricular Rate:  100 PR Interval:  135 QRS Duration: 92 QT Interval:  324 QTC Calculation: 418 R Axis:   35 Text Interpretation: Sinus tachycardia Confirmed by Kristine Royal [phone]) on 08/19/2022 8:12:40 PM  Radiology DG Chest 2 View  Result Date: 08/19/2022 CLINICAL DATA:  Chest pain EXAM: CHEST - 2 VIEW COMPARISON:  Radiographs 07/30/2022 FINDINGS: Stable cardiomediastinal silhouette. No focal consolidation, pleural effusion, or pneumothorax. No acute osseous abnormality. IMPRESSION: No active cardiopulmonary disease. Electronically Signed   By: Minerva Fester M.D.   On: 08/19/2022 17:15    Procedures Procedures    Medications Ordered in ED Medications  ketorolac (TORADOL) 15 MG/ML injection 15 mg (has no administration in time range)  ipratropium-albuterol (DUONEB) 0.5-2.5 (3) MG/3ML nebulizer solution 3 mL (has no administration in time range)    ED Course/ Medical Decision Making/ A&P                           Medical Decision Making Amount and/or Complexity of Data Reviewed Labs: ordered. Radiology: ordered.  Risk Prescription drug management.    Medical Screen Complete  This patient presented to the ED with complaint of atypical chest discomfort.  This complaint involves an extensive number of treatment options. The initial differential diagnosis includes, but is not limited to, bronchospasm, ACS, metabolic abnormality, pneumothorax, etc.  This presentation is: Acute, Chronic, Self-Limited, Previously Undiagnosed, Uncertain Prognosis, Complicated, Systemic Symptoms, and Threat to Life/Bodily Function  Patient with recently diagnosed influenza with associated mild bronchospasm.  Patient reports he was recovering from same when he  got up to go to work this morning.  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  Shortly thereafter he developed tightness of his chest.  Patient's story is most consistent with bronchospasm perhaps related to cold air exposure.  Patient's presentation is not consistent with ACS.  EKG is without acute ischemia.  Troponin x2 is undetectable.  Patient feels significantly improved after administration of Toradol and albuterol neb.  At time of discharge the patient is very interested in leaving.  Patient understands need for close outpatient follow-up.  Strict return precautions given and understood.  Additional history obtained:  External records from outside sources obtained and reviewed including prior ED visits and prior Inpatient records.    Lab Tests:  I ordered and personally interpreted labs.  The pertinent results include: CBC, BMP, troponin x2   Imaging Studies  ordered:  I ordered imaging studies including chest x-ray I independently visualized and interpreted obtained imaging which showed NAD NSR I agree with the radiologist interpretation.   Cardiac Monitoring:  The patient was maintained on a cardiac monitor.  I personally viewed and interpreted the cardiac monitor which showed an underlying rhythm of: NSR   Medicines ordered:  I ordered medication including DuoNeb, Toradol for bronchospasm, inflammation Reevaluation of the patient after these medicines showed that the patient: improved   Problem List / ED Course:  Atypical chest pain, suspected bronchospasm   Reevaluation:  After the interventions noted above, I reevaluated the patient and found that they have: improved   Disposition:  After consideration of the diagnostic results and the patients response to treatment, I feel that the patent would benefit from close outpatient follow-up.          Final Clinical Impression(s) / ED Diagnoses Final  diagnoses:  Atypical chest pain    Rx / DC Orders ED Discharge Orders     None         Wynetta Fines, MD 08/19/22 2252

## 2022-08-25 DIAGNOSIS — B353 Tinea pedis: Secondary | ICD-10-CM | POA: Diagnosis not present

## 2022-08-25 DIAGNOSIS — Z6836 Body mass index (BMI) 36.0-36.9, adult: Secondary | ICD-10-CM | POA: Diagnosis not present

## 2022-08-25 DIAGNOSIS — J208 Acute bronchitis due to other specified organisms: Secondary | ICD-10-CM | POA: Diagnosis not present

## 2022-08-25 DIAGNOSIS — B370 Candidal stomatitis: Secondary | ICD-10-CM | POA: Diagnosis not present

## 2022-09-07 ENCOUNTER — Telehealth (HOSPITAL_COMMUNITY): Payer: No Payment, Other | Admitting: Student in an Organized Health Care Education/Training Program

## 2022-09-09 ENCOUNTER — Encounter (HOSPITAL_COMMUNITY): Payer: Self-pay | Admitting: Student in an Organized Health Care Education/Training Program

## 2022-09-09 ENCOUNTER — Telehealth (HOSPITAL_COMMUNITY): Payer: Self-pay | Admitting: *Deleted

## 2022-09-09 ENCOUNTER — Telehealth (INDEPENDENT_AMBULATORY_CARE_PROVIDER_SITE_OTHER): Payer: No Payment, Other | Admitting: Student in an Organized Health Care Education/Training Program

## 2022-09-09 DIAGNOSIS — F411 Generalized anxiety disorder: Secondary | ICD-10-CM | POA: Diagnosis not present

## 2022-09-09 DIAGNOSIS — F3341 Major depressive disorder, recurrent, in partial remission: Secondary | ICD-10-CM | POA: Diagnosis not present

## 2022-09-09 MED ORDER — ARIPIPRAZOLE 10 MG PO TABS: 10.0000 mg | ORAL_TABLET | Freq: Every day | ORAL | 1 refills | Status: DC

## 2022-09-09 MED ORDER — HYDROXYZINE HCL 25 MG PO TABS: 25.0000 mg | ORAL_TABLET | Freq: Three times a day (TID) | ORAL | 0 refills | Status: DC | PRN

## 2022-09-09 NOTE — Telephone Encounter (Signed)
Patient called and states that he did not receive a call this morning for his scheduled video visit with the MD. He is wanting to know what happened and if the visit will be rescheduled.

## 2022-09-09 NOTE — Progress Notes (Signed)
BH MD/PA/NP OP Progress Note   Virtual Visit via Telephone Note  I connected with Roberto Lewis. on 09/09/22 at 10:00 AM EST by telephone and verified that I am speaking with the correct person using two identifiers.  Location: Patient: [facility]   I discussed the limitations, risks, security and privacy concerns of performing an evaluation and management service by telephone and the availability of in person appointments. I also discussed with the patient that there may be a patient responsible charge related to this service. The patient expressed understanding and agreed to proceed.   09/09/2022 3:25 PM Roberto Lewis.  MRN:  [id number]  Chief Complaint:  Chief Complaint  Patient presents with   Follow-up   Depression   Anxiety   HPI:  Roberto Lewis is a 41 yr old Lewis who presents via Virtual Phone Visit for follow up and medication management.  PPHx is significant for Depression and Anxiety.  He reports that he has continued to do well with his Abilify.  He reports his depression and anxiety are being mostly well controlled.  He reports no side effects to the Abilify except occasional tiredness but does not every day thing.  He rates his depression as a 1 out of 10 (10 being the worst).  He reports his daily anxiety as a 1-3 out of 10 (10 being the worst).  He reports that he has not had a panic attack in several years at this point.  He does report some instances where he will become overwhelmed with worry.  He gives an example of [facility] and he was unable to ride the rides due to his worry.  Discussed with him if he had ever tried hydroxyzine.  He reports that in the past he had been placed on Xanax but this made him more worried and anxious.  Discussed with him that hydroxyzine is not a benzodiazepine but closely related to Benadryl and he reports that when he takes Benadryl he does become Thaxtonalder and tired.  Discussed with him that  hydroxyzine would have the same effect but hopefully not be as tiring.  He was agreeable to a trial.  He reports no SI, HI, or AVH.  He reports his sleep is good.  He reports appetite is doing good.  He reports no other concerns at present.  He will return for follow-up in approximately 3 months.    Visit Diagnosis:    ICD-10-CM   1. Recurrent major depressive disorder, in partial remission (HCC)  F33.41 ARIPiprazole (ABILIFY) 10 MG tablet    2. GAD (generalized anxiety disorder)  F41.1 ARIPiprazole (ABILIFY) 10 MG tablet    hydrOXYzine (ATARAX) 25 MG tablet      Past Psychiatric History: Depression and Anxiety.  Past Medical History:  Past Medical History:  Diagnosis Date   Anxiety    (f/b by psych)   Hypertension    Migraine    Smoker    former    Past Surgical History:  Procedure Laterality Date   INCISION AND DRAINAGE ABSCESS ANAL  04/10/2011   Dr Daphine DeutscherMartin   INCISION AND DRAINAGE ABSCESS ANAL  May 2013   Dr Magnus IvanBlackman   INCISION AND DRAINAGE ABSCESS ANAL  09/15/2012   Dr Biagio QuintLayton   LAPAROSCOPIC APPENDECTOMY N/A 09/08/2015   Procedure: APPENDECTOMY LAPAROSCOPIC;  Surgeon: Chevis PrettyPaul Toth III, MD;  Location: [facility];  Service: General;  Laterality: N/A;    Family Psychiatric History: Father- Bipolar Disorder  Family History:  Family History  Problem Relation Age of Onset   COPD Father    Heart disease Sister        congenital    Social History:  Social History   Socioeconomic History   Marital status: Married    Spouse name: Not on file   Number of children: 1   Years of education: Not on file   Highest education level: Not on file  Occupational History   Occupation: Forensic scientist: [employer]  Tobacco Use   Smoking status: Former    Packs/day: 1.00    Types: Cigarettes    Quit date: 03/29/2011    Years since quitting: 11.4   Smokeless tobacco: Never  Vaping Use   Vaping Use: Never used  Substance and Sexual Activity   Alcohol use: No   Drug  use: No   Sexual activity: Yes  Other Topics Concern   Not on file  Social History Narrative   Not on file   Social Determinants of Health   Financial Resource Strain: Not on file  Food Insecurity: Not on file  Transportation Needs: Not on file  Physical Activity: Not on file  Stress: Not on file  Social Connections: Not on file    Allergies:  Allergies  Allergen Reactions   Effexor [Venlafaxine Hydrochloride] Swelling and Other (See Comments)    Throat swelling, blurred vision    Metabolic Disorder Labs: Lab Results  Component Value Date   HGBA1C 5.7 (H) 12/16/2015   MPG 117 12/16/2015   MPG 117 (H) 07/18/2015   Lab Results  Component Value Date   PROLACTIN 7.5 07/25/2015   No results found for: "CHOL", "TRIG", "HDL", "CHOLHDL", "VLDL", "LDLCALC" Lab Results  Component Value Date   TSH 1.050 07/18/2015   TSH 1.295 10/20/2013    Therapeutic Level Labs: No results found for: "LITHIUM" No results found for: "VALPROATE" No results found for: "CBMZ"  Current Medications: Current Outpatient Medications  Medication Sig Dispense Refill   hydrOXYzine (ATARAX) 25 MG tablet Take 1 tablet (25 mg total) by mouth 3 (three) times daily as needed. 30 tablet 0   acetaminophen (TYLENOL) 325 MG tablet Take 650 mg by mouth every 6 (six) hours as needed for moderate pain.     amoxicillin (AMOXIL) 500 MG capsule Take 1 capsule (500 mg total) by mouth 3 (three) times daily. 21 capsule 0   ARIPiprazole (ABILIFY) 10 MG tablet Take 1 tablet (10 mg total) by mouth at bedtime. 90 tablet 1   ibuprofen (ADVIL) 200 MG tablet Take 600 mg by mouth every 6 (six) hours as needed for moderate pain.     lisinopril-hydrochlorothiazide (PRINZIDE,ZESTORETIC) 10-12.5 MG tablet TAKE ONE TABLET BY MOUTH ONE TIME DAILY **NEW INS CARD?? (Patient not taking: No sig reported) 90 tablet 1   meclizine (ANTIVERT) 25 MG tablet Take 1 tablet (25 mg total) by mouth 3 (three) times daily as needed for dizziness.  30 tablet 0   oseltamivir (TAMIFLU) 75 MG capsule Take 1 capsule (75 mg total) by mouth every 12 (twelve) hours. 10 capsule 0   OVER THE COUNTER MEDICATION Take 6 mg by mouth as needed (dipping sensation). Zyn nicotine pouch     No current facility-administered medications for this visit.     Musculoskeletal: Strength & Muscle Tone: unable to assess due to phone visit Gait & Station: unable to assess due to phone visit Patient leans: unable to assess due to phone visit  Psychiatric Specialty Exam: Review of  Systems  Respiratory:  Negative for cough and shortness of breath.   Cardiovascular:  Negative for chest pain.  Gastrointestinal:  Negative for abdominal pain, constipation, diarrhea, nausea and vomiting.  Neurological:  Negative for weakness and headaches.  Psychiatric/Behavioral:  Negative for dysphoric mood, hallucinations, sleep disturbance and suicidal ideas. The patient is not nervous/anxious.     There were no vitals taken for this visit.There is no height or weight on file to calculate BMI.  General Appearance: unable to assess due to phone visit  Eye Contact:  unable to assess due to phone visit  Speech:  Clear and Coherent and Normal Rate  Volume:  Normal  Mood:   "ok"  Affect:  Appropriate and Congruent  Thought Process:  Coherent and Goal Directed  Orientation:  Full (Time, Place, and Person)  Thought Content: WDL and Logical   Suicidal Thoughts:  No  Homicidal Thoughts:  No  Memory:  Immediate;   Good Recent;   Good  Judgement:  Good  Insight:  Good  Psychomotor Activity:  Normal  Concentration:  Concentration: Good and Attention Span: Good  Recall:  Good  Fund of Knowledge: Good  Language: Good  Akathisia:  Negative  Handed:  Right  AIMS (if indicated): unable to assess due to phone visit  Assets:  Communication Skills Desire for Improvement Financial Resources/Insurance Housing Intimacy Resilience Social Support  ADL's:  Intact  Cognition: WNL   Sleep:  Good   Screenings: GAD-7    Flowsheet Row Video Visit from 06/17/2022 in [facility] Video Visit from 03/17/2022 in [facility] Video Visit from 08/27/2021 in [facility] Video Visit from 05/21/2021 in [facility] Video Visit from 02/19/2021 in [facility]  Total GAD-7 Score 4 3 4 4 3       PHQ2-9    Flowsheet Row Video Visit from 06/17/2022 in [facility] Video Visit from 03/17/2022 in [facility] Video Visit from 08/27/2021 in [facility] Video Visit from 05/21/2021 in [facility] Video Visit from 02/19/2021 in [facility]  PHQ-2 Total Score 0 2 0 0 2  PHQ-9 Total Score 0 3 1 1 3       Flowsheet Row ED from 08/19/2022 in [facility] ED from 08/09/2022 in [facility] Video Visit from 11/26/2020 in [facility]  C-SSRS RISK CATEGORY No Risk No Risk No Risk        Assessment and Plan:  Roberto Lewis is a 41 yr old Lewis who presents via Virtual Phone Visit for follow up and medication management.  PPHx is significant for Depression and Anxiety.   Roberto Lewis has continued to do well with his current medications.  He does report a few instances where he does get overwhelmed with worry so we will start Hydroxyzine.  We will not make any other changes to his medications at this time.  He will return for in person follow up in approximately 3 months.   MDD, in partial remission  GAD: -Continue Abilify 10 mg daily for depression and anxiety.  90 tablets with 1 refill. -Start Hydroxyzine 25 mg TID PRN anxiety.  30 tablets with 0 refills.   Collaboration of Care:   Patient/Guardian was advised Release of Information  must be obtained prior to any record release in order to collaborate their  care with an outside provider. Patient/Guardian was advised if they have not already done so to contact the registration department to sign all necessary forms in order for [place] to release information regarding their care.   Consent: Patient/Guardian gives verbal consent for treatment and assignment of benefits for services provided during this visit. Patient/Guardian expressed understanding and agreed to proceed.    Lauro Franklin, MD 09/09/2022, 3:25 PM      Follow Up Instructions:    I discussed the assessment and treatment plan with the patient. The patient was provided an opportunity to ask questions and all were answered. The patient agreed with the plan and demonstrated an understanding of the instructions.   The patient was advised to call back or seek an in-person evaluation if the symptoms worsen or if the condition fails to improve as anticipated.  I provided 12 minutes of non-face-to-face time during this encounter.   Lauro Franklin, MD

## 2022-09-09 NOTE — Telephone Encounter (Signed)
Called patient back and conducted the interview.  See Progress Note for details.    Arna Snipe MD Resident

## 2022-11-10 ENCOUNTER — Emergency Department (HOSPITAL_BASED_OUTPATIENT_CLINIC_OR_DEPARTMENT_OTHER): Payer: BC Managed Care – PPO

## 2022-11-10 ENCOUNTER — Encounter (HOSPITAL_BASED_OUTPATIENT_CLINIC_OR_DEPARTMENT_OTHER): Payer: Self-pay

## 2022-11-10 ENCOUNTER — Other Ambulatory Visit: Payer: Self-pay

## 2022-11-10 DIAGNOSIS — I10 Essential (primary) hypertension: Secondary | ICD-10-CM | POA: Insufficient documentation

## 2022-11-10 DIAGNOSIS — R1032 Left lower quadrant pain: Secondary | ICD-10-CM | POA: Insufficient documentation

## 2022-11-10 DIAGNOSIS — Z87891 Personal history of nicotine dependence: Secondary | ICD-10-CM | POA: Diagnosis not present

## 2022-11-10 DIAGNOSIS — Z79899 Other long term (current) drug therapy: Secondary | ICD-10-CM | POA: Diagnosis not present

## 2022-11-10 DIAGNOSIS — K760 Fatty (change of) liver, not elsewhere classified: Secondary | ICD-10-CM | POA: Diagnosis not present

## 2022-11-10 LAB — COMPREHENSIVE METABOLIC PANEL
ALT: 43 U/L (ref 0–44)
AST: 30 U/L (ref 15–41)
Albumin: 4.7 g/dL (ref 3.5–5.0)
Alkaline Phosphatase: 67 U/L (ref 38–126)
Anion gap: 9 (ref 5–15)
BUN: 19 mg/dL (ref 6–20)
CO2: 28 mmol/L (ref 22–32)
Calcium: 9.8 mg/dL (ref 8.9–10.3)
Chloride: 101 mmol/L (ref 98–111)
Creatinine, Ser: 1.05 mg/dL (ref 0.61–1.24)
GFR, Estimated: >60 mL/min (ref >60)
Glucose, Bld: 138 mg/dL — ABNORMAL HIGH (ref 70–99)
Potassium: 3.6 mmol/L (ref 3.5–5.1)
Sodium: 138 mmol/L (ref 135–145)
Total Bilirubin: 0.4 mg/dL (ref 0.3–1.2)
Total Protein: 6.8 g/dL (ref 6.5–8.1)

## 2022-11-10 LAB — CBC
HCT: 44.6 % (ref 39.0–52.0)
Hemoglobin: 15.3 g/dL (ref 13.0–17.0)
MCH: 31.1 pg (ref 26.0–34.0)
MCHC: 34.3 g/dL (ref 30.0–36.0)
MCV: 90.7 fL (ref 80.0–100.0)
Platelets: 257 10*3/uL (ref 150–400)
RBC: 4.92 MIL/uL (ref 4.22–5.81)
RDW: 12.4 % (ref 11.5–15.5)
WBC: 10.5 10*3/uL (ref 4.0–10.5)
nRBC: 0 % (ref 0.0–0.2)

## 2022-11-10 LAB — LIPASE, BLOOD: Lipase: 53 U/L — ABNORMAL HIGH (ref 11–51)

## 2022-11-10 NOTE — ED Triage Notes (Signed)
Patient arrives to ED POV c/o LLQ pain x1 week. Pt states pain occasionally radiates to back. Pt states he has been nauseous but denies vomiting and diarrhea. Pt also complains of some constipation.

## 2022-11-11 ENCOUNTER — Emergency Department (HOSPITAL_BASED_OUTPATIENT_CLINIC_OR_DEPARTMENT_OTHER)
Admission: EM | Admit: 2022-11-11 | Discharge: 2022-11-11 | Disposition: A | Discharge status: Home / Self Care | Payer: BC Managed Care – PPO | Attending: Emergency Medicine | Admitting: Emergency Medicine

## 2022-11-11 DIAGNOSIS — R1032 Left lower quadrant pain: Secondary | ICD-10-CM

## 2022-11-11 LAB — URINALYSIS, ROUTINE W REFLEX MICROSCOPIC
Bilirubin Urine: NEGATIVE
Glucose, UA: NEGATIVE mg/dL
Hgb urine dipstick: NEGATIVE
Ketones, ur: NEGATIVE mg/dL
Leukocytes,Ua: NEGATIVE
Nitrite: NEGATIVE
Protein, ur: NEGATIVE mg/dL
Specific Gravity, Urine: 1.018 (ref 1.005–1.030)
pH: 5.5 (ref 5.0–8.0)

## 2022-11-11 MED ORDER — MELOXICAM 15 MG PO TABS: 15.0000 mg | ORAL_TABLET | Freq: Every day | ORAL | 0 refills | Status: DC

## 2022-11-11 MED ORDER — KETOROLAC TROMETHAMINE 60 MG/2ML IM SOLN
60.0000 mg | Freq: Once | INTRAMUSCULAR | Status: AC
  Administered 2022-11-11: 60 mg via INTRAMUSCULAR
  Filled 2022-11-11: qty 2

## 2022-11-11 NOTE — ED Provider Notes (Signed)
[facility] Provider Note   CSN: [id number] Arrival date & time: 11/10/22  2135     History  Chief Complaint  Patient presents with   Abdominal Pain    Roberto Toledo. is a 42 y.o. male.  The history is provided by the patient.  Abdominal Pain Pain location: left groin. Pain radiates to:  Does not radiate Pain severity:  Moderate Onset quality:  Gradual Duration:  5 days Timing:  Constant Progression:  Unchanged Chronicity:  New Context: not retching and not trauma   Relieved by:  Nothing Worsened by:  Nothing Ineffective treatments:  None tried Associated symptoms: no dysuria, no fever, no nausea and no vomiting   Risk factors: no recent hospitalization   Patient with hypertension presents with 5 days of groin pain.  No trauma.  No associated symptoms.  No n/v/d.  No urinary symptoms.  No medication takes.      Past Medical History:  Diagnosis Date   Anxiety    (f/b by psych)   Hypertension    Migraine    Smoker    former     Home Medications Prior to Admission medications   Medication Sig Start Date End Date Taking? Authorizing Provider  acetaminophen (TYLENOL) 325 MG tablet Take 650 mg by mouth every 6 (six) hours as needed for moderate pain.    [provider]  amoxicillin (AMOXIL) 500 MG capsule Take 1 capsule (500 mg total) by mouth 3 (three) times daily. 08/09/22   Rancour, Annie Main, MD  ARIPiprazole (ABILIFY) 10 MG tablet Take 1 tablet (10 mg total) by mouth at bedtime. 09/09/22   Briant Cedar, MD  hydrOXYzine (ATARAX) 25 MG tablet Take 1 tablet (25 mg total) by mouth 3 (three) times daily as needed. 09/09/22   Briant Cedar, MD  ibuprofen (ADVIL) 200 MG tablet Take 600 mg by mouth every 6 (six) hours as needed for moderate pain.    [provider]  lisinopril-hydrochlorothiazide (PRINZIDE,ZESTORETIC) 10-12.5 MG tablet TAKE ONE TABLET BY MOUTH ONE TIME DAILY **NEW INS  CARD?? Patient not taking: No sig reported 12/09/15   Denita Lung, MD  meclizine (ANTIVERT) 25 MG tablet Take 1 tablet (25 mg total) by mouth 3 (three) times daily as needed for dizziness. 11/18/20   Domenic Moras, PA-C  oseltamivir (TAMIFLU) 75 MG capsule Take 1 capsule (75 mg total) by mouth every 12 (twelve) hours. 08/09/22   Rancour, Annie Main, MD  OVER THE COUNTER MEDICATION Take 6 mg by mouth as needed (dipping sensation). Zyn nicotine pouch    [provider]      Allergies    Effexor [venlafaxine hydrochloride] and Prednisone    Review of Systems   Review of Systems  Constitutional:  Negative for fever.  HENT:  Negative for facial swelling.   Eyes:  Negative for redness.  Respiratory:  Negative for wheezing and stridor.   Gastrointestinal:  Positive for abdominal pain. Negative for nausea and vomiting.  Genitourinary:  Negative for dysuria.  All other systems reviewed and are negative.   Physical Exam Updated Vital Signs BP (!) 150/83   Pulse 80   Temp 98.4 F (36.9 C) (Oral)   Resp 16   Ht 6' 1"$  (1.854 m)   Wt 115.7 kg   SpO2 99%   BMI 33.64 kg/m  Physical Exam Vitals and nursing note reviewed.  Constitutional:      General: He is not in acute distress.    Appearance:  Normal appearance. He is well-developed. He is not diaphoretic.  HENT:     Head: Normocephalic and atraumatic.     Nose: Nose normal.  Eyes:     Conjunctiva/sclera: Conjunctivae normal.     Pupils: Pupils are equal, round, and reactive to light.  Cardiovascular:     Rate and Rhythm: Normal rate and regular rhythm.     Pulses: Normal pulses.     Heart sounds: Normal heart sounds.  Pulmonary:     Effort: Pulmonary effort is normal.     Breath sounds: Normal breath sounds. No wheezing or rales.  Abdominal:     General: Bowel sounds are normal.     Palpations: Abdomen is soft.     Tenderness: There is no abdominal tenderness. There is no guarding or rebound.  Musculoskeletal:         General: Normal range of motion.     Cervical back: Normal range of motion and neck supple.  Skin:    General: Skin is warm and dry.     Capillary Refill: Capillary refill takes less than 2 seconds.  Neurological:     General: No focal deficit present.     Mental Status: He is alert and oriented to person, place, and time.     Deep Tendon Reflexes: Reflexes normal.  Psychiatric:        Mood and Affect: Mood normal.        Behavior: Behavior normal.     ED Results / Procedures / Treatments   Labs (all labs ordered are listed, but only abnormal results are displayed) Results for orders placed or performed during the hospital encounter of 11/11/22  Lipase, blood  Result Value Ref Range   Lipase 53 (H) 11 - 51 U/L  Comprehensive metabolic panel  Result Value Ref Range   Sodium 138 135 - 145 mmol/L   Potassium 3.6 3.5 - 5.1 mmol/L   Chloride 101 98 - 111 mmol/L   CO2 28 22 - 32 mmol/L   Glucose, Bld 138 (H) 70 - 99 mg/dL   BUN 19 6 - 20 mg/dL   Creatinine, Ser 1.05 0.61 - 1.24 mg/dL   Calcium 9.8 8.9 - 10.3 mg/dL   Total Protein 6.8 6.5 - 8.1 g/dL   Albumin 4.7 3.5 - 5.0 g/dL   AST 30 15 - 41 U/L   ALT 43 0 - 44 U/L   Alkaline Phosphatase 67 38 - 126 U/L   Total Bilirubin 0.4 0.3 - 1.2 mg/dL   GFR, Estimated >60 >60 mL/min   Anion gap 9 5 - 15  CBC  Result Value Ref Range   WBC 10.5 4.0 - 10.5 K/uL   RBC 4.92 4.22 - 5.81 MIL/uL   Hemoglobin 15.3 13.0 - 17.0 g/dL   HCT 44.6 39.0 - 52.0 %   MCV 90.7 80.0 - 100.0 fL   MCH 31.1 26.0 - 34.0 pg   MCHC 34.3 30.0 - 36.0 g/dL   RDW 12.4 11.5 - 15.5 %   Platelets 257 150 - 400 K/uL   nRBC 0.0 0.0 - 0.2 %  Urinalysis, Routine w reflex microscopic -Urine, Clean Catch  Result Value Ref Range   Color, Urine YELLOW YELLOW   APPearance CLEAR CLEAR   Specific Gravity, Urine 1.018 1.005 - 1.030   pH 5.5 5.0 - 8.0   Glucose, UA NEGATIVE NEGATIVE mg/dL   Hgb urine dipstick NEGATIVE NEGATIVE   Bilirubin Urine NEGATIVE NEGATIVE    Ketones, ur NEGATIVE NEGATIVE mg/dL  Protein, ur NEGATIVE NEGATIVE mg/dL   Nitrite NEGATIVE NEGATIVE   Leukocytes,Ua NEGATIVE NEGATIVE   CT Renal Stone Study  Result Date: 11/11/2022 CLINICAL DATA:  Left lower quadrant pain for 1 week, initial encounter EXAM: CT ABDOMEN AND PELVIS WITHOUT CONTRAST TECHNIQUE: Multidetector CT imaging of the abdomen and pelvis was performed following the standard protocol without IV contrast. RADIATION DOSE REDUCTION: This exam was performed according to the departmental dose-optimization program which includes automated exposure control, adjustment of the mA and/or kV according to patient size and/or use of iterative reconstruction technique. COMPARISON:  05/12/2019 FINDINGS: Lower chest: No acute abnormality. Hepatobiliary: Fatty infiltration of the liver is noted. The gallbladder is decompressed. Pancreas: Unremarkable. No pancreatic ductal dilatation or surrounding inflammatory changes. Spleen: Normal in size without focal abnormality. Adrenals/Urinary Tract: Adrenal glands are within normal limits. Kidneys demonstrate no renal calculi or obstructive changes. The bladder is well distended. Stomach/Bowel: Appendix has been surgically removed. No obstructive or inflammatory changes of the colon are seen. The stomach is distended with ingested food stuffs. Small bowel is within normal limits. Vascular/Lymphatic: No significant vascular findings are present. No enlarged abdominal or pelvic lymph nodes. Reproductive: Prostate is unremarkable. Other: None Musculoskeletal: No acute or significant osseous findings. IMPRESSION: Fatty liver. No acute abnormality noted. Electronically Signed   By: Inez Catalina M.D.   On: 11/11/2022 00:03     Radiology CT Renal Stone Study  Result Date: 11/11/2022 CLINICAL DATA:  Left lower quadrant pain for 1 week, initial encounter EXAM: CT ABDOMEN AND PELVIS WITHOUT CONTRAST TECHNIQUE: Multidetector CT imaging of the abdomen and pelvis was  performed following the standard protocol without IV contrast. RADIATION DOSE REDUCTION: This exam was performed according to the departmental dose-optimization program which includes automated exposure control, adjustment of the mA and/or kV according to patient size and/or use of iterative reconstruction technique. COMPARISON:  05/12/2019 FINDINGS: Lower chest: No acute abnormality. Hepatobiliary: Fatty infiltration of the liver is noted. The gallbladder is decompressed. Pancreas: Unremarkable. No pancreatic ductal dilatation or surrounding inflammatory changes. Spleen: Normal in size without focal abnormality. Adrenals/Urinary Tract: Adrenal glands are within normal limits. Kidneys demonstrate no renal calculi or obstructive changes. The bladder is well distended. Stomach/Bowel: Appendix has been surgically removed. No obstructive or inflammatory changes of the colon are seen. The stomach is distended with ingested food stuffs. Small bowel is within normal limits. Vascular/Lymphatic: No significant vascular findings are present. No enlarged abdominal or pelvic lymph nodes. Reproductive: Prostate is unremarkable. Other: None Musculoskeletal: No acute or significant osseous findings. IMPRESSION: Fatty liver. No acute abnormality noted. Electronically Signed   By: Inez Catalina M.D.   On: 11/11/2022 00:03    Procedures Procedures    Medications Ordered in ED Medications  ketorolac (TORADOL) injection 60 mg (has no administration in time range)    ED Course/ Medical Decision Making/ A&P                             Medical Decision Making Patient with left groin pain   Amount and/or Complexity of Data Reviewed External Data Reviewed: notes.    Details: Previous notes reviewed  Labs: ordered.    Details: All labs reviewed:  normal sodium 138, normal potassium 3.6, normal creatinine.  Normal LFTs.  Urine is without UTI.  White count normal 10.5, normal hemoglobin 15.3, normal platelets  Radiology:  ordered and independent interpretation performed.    Details: Negative CT by me   Risk  Prescription drug management. Risk Details: Very well appearing.  Normal exam, labs and imaging.  This is likely a groin strain, though patient cannot recall trauma.  Will start NSAIDs as no medications taken previously.  Stable for discharge.  Strict return.      Final Clinical Impression(s) / ED Diagnoses Final diagnoses:  None   Return for intractable cough, coughing up blood, fevers > 100.4 unrelieved by medication, shortness of breath, intractable vomiting, chest pain, shortness of breath, weakness, numbness, changes in speech, facial asymmetry, abdominal pain, passing out, Inability to tolerate liquids or food, cough, altered mental status or any concerns. No signs of systemic illness or infection. The patient is nontoxic-appearing on exam and vital signs are within normal limits.  I have reviewed the triage vital signs and the nursing notes. Pertinent labs & imaging results that were available during my care of the patient were reviewed by me and considered in my medical decision making (see chart for details). After history, exam, and medical workup I feel the patient has been appropriately medically screened and is safe for discharge home. Pertinent diagnoses were discussed with the patient. Patient was given return precautions.      Rx / DC Orders ED Discharge Orders     None         Ellington Greenslade, MD 11/11/22 [id number]

## 2022-11-13 DIAGNOSIS — B349 Viral infection, unspecified: Secondary | ICD-10-CM | POA: Diagnosis not present

## 2022-11-13 DIAGNOSIS — J029 Acute pharyngitis, unspecified: Secondary | ICD-10-CM | POA: Diagnosis not present

## 2022-12-04 DIAGNOSIS — K610 Anal abscess: Secondary | ICD-10-CM | POA: Diagnosis not present

## 2022-12-14 ENCOUNTER — Telehealth (INDEPENDENT_AMBULATORY_CARE_PROVIDER_SITE_OTHER): Payer: BC Managed Care – PPO | Admitting: Student in an Organized Health Care Education/Training Program

## 2022-12-14 ENCOUNTER — Encounter (HOSPITAL_COMMUNITY): Payer: Self-pay | Admitting: Student in an Organized Health Care Education/Training Program

## 2022-12-14 DIAGNOSIS — F3341 Major depressive disorder, recurrent, in partial remission: Secondary | ICD-10-CM

## 2022-12-14 DIAGNOSIS — F411 Generalized anxiety disorder: Secondary | ICD-10-CM | POA: Diagnosis not present

## 2022-12-14 MED ORDER — ARIPIPRAZOLE 10 MG PO TABS: 10.0000 mg | ORAL_TABLET | Freq: Every day | ORAL | 1 refills | Status: DC

## 2022-12-14 NOTE — Progress Notes (Signed)
[facility] MD/PA/NP OP Progress Note  Virtual Visit via Video Note  I connected with Roberto Nab. on 12/14/22 at  2:30 PM EDT by a video enabled telemedicine application and verified that I am speaking with the correct person using two identifiers.  Location: Patient: Work Provider: [facility]   I discussed the limitations of evaluation and management by telemedicine and the availability of in person appointments. The patient expressed understanding and agreed to proceed.   12/14/2022 2:54 PM Roberto Nab.  MRN:  [id number]  Chief Complaint:  Chief Complaint  Patient presents with   Follow-up   HPI:  Roberto Lewis is a 42 yr old male who presents via Virtual Video Visit for follow up and medication management. PPHx is significant for Depression and Anxiety.   He reports that he continues to do well with his Abilify.  He reports that he has taken the hydroxyzine a handful of times and it has been helpful with his anxiety but it also did make him go to sleep.  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  Discussed with him that since he is doing so well we would not make any changes to his medications at this time and he was agreeable to this.  He reports no SI, HI, or AVH.  He reports his sleep is good.  He reports his appetite is good.  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  He reports no other concerns present.  He will return for follow-up approximately 3 months.  Visit Diagnosis:    ICD-10-CM   1. GAD (generalized anxiety disorder)  F41.1 ARIPiprazole (ABILIFY) 10 MG tablet    2. Recurrent major depressive disorder, in partial remission (HCC)  F33.41 ARIPiprazole (ABILIFY) 10 MG tablet      Past  Psychiatric History: Depression and Anxiety.   Past Medical History:  Past Medical History:  Diagnosis Date   Anxiety    (f/b by psych)   Hypertension    Migraine    Smoker    former    Past Surgical History:  Procedure Laterality Date   INCISION AND DRAINAGE ABSCESS ANAL  04/10/2011   Dr Hassell Done   INCISION AND DRAINAGE ABSCESS ANAL  May 2013   Dr Ninfa Linden   INCISION AND DRAINAGE ABSCESS ANAL  09/15/2012   Dr Lilyan Punt   LAPAROSCOPIC APPENDECTOMY N/A 09/08/2015   Procedure: APPENDECTOMY LAPAROSCOPIC;  Surgeon: Autumn Messing III, MD;  Location: [facility];  Service: General;  Laterality: N/A;    Family Psychiatric History: Father- Bipolar Disorder   Family History:  Family History  Problem Relation Age of Onset   COPD Father    Heart disease Sister        congenital    Social History:  Social History   Socioeconomic History   Marital status: Married    Spouse name: Not on file   Number of children: 1   Years of education: Not on file   Highest education level: Not on file  Occupational History   Occupation: Systems developer: [employer]  Tobacco Use   Smoking status: Former    Packs/day: 1    Types: Cigarettes    Quit date:  03/29/2011    Years since quitting: 11.7   Smokeless tobacco: Never  Vaping Use   Vaping Use: Never used  Substance and Sexual Activity   Alcohol use: No   Drug use: No   Sexual activity: Yes  Other Topics Concern   Not on file  Social History Narrative   Not on file   Social Determinants of Health   Financial Resource Strain: Not on file  Food Insecurity: Not on file  Transportation Needs: Not on file  Physical Activity: Not on file  Stress: Not on file  Social Connections: Not on file    Allergies:  Allergies  Allergen Reactions   Effexor [Venlafaxine Hydrochloride] Swelling and Other (See Comments)    Throat swelling, blurred vision   Prednisone Other (See Comments)    "Hallucinations"     Metabolic Disorder  Labs: Lab Results  Component Value Date   HGBA1C 5.7 (H) 12/16/2015   MPG 117 12/16/2015   MPG 117 (H) 07/18/2015   Lab Results  Component Value Date   PROLACTIN 7.5 07/25/2015   No results found for: "CHOL", "TRIG", "HDL", "CHOLHDL", "VLDL", "LDLCALC" Lab Results  Component Value Date   TSH 1.050 07/18/2015   TSH 1.295 10/20/2013    Therapeutic Level Labs: No results found for: "LITHIUM" No results found for: "VALPROATE" No results found for: "CBMZ"  Current Medications: Current Outpatient Medications  Medication Sig Dispense Refill   acetaminophen (TYLENOL) 325 MG tablet Take 650 mg by mouth every 6 (six) hours as needed for moderate pain.     amoxicillin (AMOXIL) 500 MG capsule Take 1 capsule (500 mg total) by mouth 3 (three) times daily. 21 capsule 0   ARIPiprazole (ABILIFY) 10 MG tablet Take 1 tablet (10 mg total) by mouth at bedtime. 90 tablet 1   hydrOXYzine (ATARAX) 25 MG tablet Take 1 tablet (25 mg total) by mouth 3 (three) times daily as needed. 30 tablet 0   ibuprofen (ADVIL) 200 MG tablet Take 600 mg by mouth every 6 (six) hours as needed for moderate pain.     lisinopril-hydrochlorothiazide (PRINZIDE,ZESTORETIC) 10-12.5 MG tablet TAKE ONE TABLET BY MOUTH ONE TIME DAILY **NEW INS CARD?? (Patient not taking: No sig reported) 90 tablet 1   meclizine (ANTIVERT) 25 MG tablet Take 1 tablet (25 mg total) by mouth 3 (three) times daily as needed for dizziness. 30 tablet 0   meloxicam (MOBIC) 15 MG tablet Take 1 tablet (15 mg total) by mouth daily. 5 tablet 0   oseltamivir (TAMIFLU) 75 MG capsule Take 1 capsule (75 mg total) by mouth every 12 (twelve) hours. 10 capsule 0   OVER THE COUNTER MEDICATION Take 6 mg by mouth as needed (dipping sensation). Zyn nicotine pouch     No current facility-administered medications for this visit.     Musculoskeletal: Strength & Muscle Tone: within normal limits Gait & Station:  Sitting During Interview Patient leans:  N/A  Psychiatric Specialty Exam: Review of Systems  Respiratory:  Negative for cough and shortness of breath.   Cardiovascular:  Negative for chest pain.  Gastrointestinal:  Negative for abdominal pain, constipation, diarrhea, nausea and vomiting.  Neurological:  Negative for weakness and headaches.  Psychiatric/Behavioral:  Negative for dysphoric mood, hallucinations, sleep disturbance and suicidal ideas. The patient is not nervous/anxious.     There were no vitals taken for this visit.There is no height or weight on file to calculate BMI.  General Appearance: Casual and Fairly Groomed  Eye Contact:  Good  Speech:  Clear and Coherent and Normal Rate  Volume:  Normal  Mood:   "ok"  Affect:  Appropriate and Congruent  Thought Process:  Coherent and Goal Directed  Orientation:  Full (Time, Place, and Person)  Thought Content: WDL and Logical   Suicidal Thoughts:  No  Homicidal Thoughts:  No  Memory:  Immediate;   Good Recent;   Good  Judgement:  Good  Insight:  Good  Psychomotor Activity:  Normal  Concentration:  Concentration: Good and Attention Span: Good  Recall:  Good  Fund of Knowledge: Good  Language: Good  Akathisia:  Negative  Handed:  Right  AIMS (if indicated): not done  Assets:  Communication Skills Desire for Improvement Financial Resources/Insurance Housing Intimacy Resilience Social Support  ADL's:  Intact  Cognition: WNL  Sleep:  Good   Screenings: GAD-7    Flowsheet Row Video Visit from 06/17/2022 in [facility] Video Visit from 03/17/2022 in [facility] Video Visit from 08/27/2021 in [facility] Video Visit from 05/21/2021 in [facility] Video Visit from 02/19/2021 in [facility]  Total GAD-7 Score 4 3 4 4 3       PHQ2-9    Flowsheet Row Video Visit from 06/17/2022 in [facility]  Video Visit from 03/17/2022 in [facility] Video Visit from 08/27/2021 in [facility] Video Visit from 05/21/2021 in [facility] Video Visit from 02/19/2021 in [facility]  PHQ-2 Total Score 0 2 0 0 2  PHQ-9 Total Score 0 3 1 1 3       Flowsheet Row ED from 11/11/2022 in [facility] at [facility] ED from 08/19/2022 in [facility] ED from 08/09/2022 in [facility] Department at [place] No Risk No Risk No Risk        Assessment and Plan:  Roberto Lewis is a 42 yr old male who presents via Virtual Video Visit for follow up and medication management. PPHx is significant for Depression and Anxiety.    Roberto Lewis has continued to do well on his medications without issue.  We will not make any changes to his medication at this time.  Refills were sent in.  He will return for follow-up in approximately 3 months.   MDD, in partial remission  GAD: -Continue Abilify 10 mg daily for depression and anxiety.  90 tablets with 1 refill. -Continue Hydroxyzine 25 mg TID PRN anxiety.  No refills sent at this time.    Collaboration of Care:   Patient/Guardian was advised Release of Information must be obtained prior to any record release in order to collaborate their care with an outside provider. Patient/Guardian was advised if they have not already done so to contact the registration department to sign all necessary forms in order for [place] to release information regarding their care.   Consent: Patient/Guardian gives verbal consent for treatment and assignment of benefits for services provided during this visit. Patient/Guardian expressed understanding and agreed to proceed.    Briant Cedar, MD 12/14/2022, 2:54 PM  Follow Up Instructions:    I discussed the  assessment and treatment plan with the patient. The patient was provided an opportunity to ask questions and all were answered. The patient agreed with the plan and demonstrated an understanding of the instructions.  The patient was advised to call back or seek an in-person evaluation if the symptoms worsen or if the condition fails to improve as anticipated.  I provided 14 minutes of non-face-to-face time during this encounter.   Briant Cedar, MD

## 2023-03-18 ENCOUNTER — Encounter (HOSPITAL_COMMUNITY): Payer: Self-pay | Admitting: Student in an Organized Health Care Education/Training Program

## 2023-03-18 ENCOUNTER — Encounter (HOSPITAL_COMMUNITY): Payer: Self-pay

## 2023-03-18 NOTE — Progress Notes (Signed)
This encounter was created in error - please disregard.

## 2023-05-18 DIAGNOSIS — L02215 Cutaneous abscess of perineum: Secondary | ICD-10-CM | POA: Diagnosis not present

## 2023-05-18 DIAGNOSIS — K921 Melena: Secondary | ICD-10-CM | POA: Diagnosis not present

## 2023-06-24 ENCOUNTER — Encounter: Payer: Self-pay | Admitting: Physician Assistant

## 2023-06-29 ENCOUNTER — Ambulatory Visit: Payer: Self-pay | Admitting: General Surgery

## 2023-06-29 DIAGNOSIS — K603 Anal fistula, unspecified: Secondary | ICD-10-CM | POA: Diagnosis not present

## 2023-06-29 NOTE — H&P (Signed)
PROVIDER:  Elenora Gamma, MD  MRN: [id number] DOB: [birth year] DATE OF ENCOUNTER: 06/29/2023  Subjective  Chief Complaint: history of anorectal abscesses     History of Present Illness:  Galen Russman is a 42 y.o. 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  These have been mostly drained in the office.   MRI November 2020 showed no sign of fistula, but due to recurrent episodes, I recommended EUA, but he did not have insurance at that time and did not pursue this.    In June 2021, he underwent I&D of right anterior/perineal region. He was seen in Nov 2021 for recurrent symptoms but no obvious abscess was seen.  He was prescribed Cipro/Flagyl.  He was seen again in Nov 2021 with recurrent perineal pain/swelling.  It had drained twice over the previous days.  Antibiotics were perscribed again to help with the infection resolved.  He was last seen in March 2024 at which time there was some swelling on the right perineum with bluish discoloration under the skin.  He underwent bedside I&D releasing a few cc of bloody/purulent fluid and 2 large blood clots.  Since then, he states he has had 2-3 flares in the perineum.  He states 2 days ago the area started to swell.  Yesterday, he states the swelling was about the size of a golf ball.  Overnight, the swelling decreased but he denies any external drainage.  He has been doing warm tub soaks.  He states he still has about 3-4 loose bowel movements daily at baseline.  Patient having a lot of left lower quadrant pain as well.  He is set  to see [facility] tomorrow to be evaluated for colonoscopy.   Review of Systems: A complete review of systems was obtained from the patient.  I have reviewed this information and discussed as appropriate with the patient.  See HPI as well for other ROS.   Medical History: Past Medical History: Diagnosis Date  Anxiety    There is no problem list on file for this  patient.   Past Surgical History: Procedure Laterality Date  APPENDECTOMY  09/08/2015  Dr. Carolynne Edouard    Allergies Allergen Reactions  Venlafaxine Anaphylaxis, Other (See Comments) and Swelling   Throat swelling, blurred vision    Current Outpatient Medications on File Prior to Visit Medication Sig Dispense Refill  ARIPiprazole (ABILIFY) 10 MG tablet Take by mouth    No current facility-administered medications on file prior to visit.   Family History Problem Relation Age of Onset  Breast cancer Mother   Deep vein thrombosis (DVT or abnormal blood clot formation) Father   Hyperlipidemia (Elevated cholesterol) Father     Social History  Tobacco Use Smoking Status Former  Current packs/day: 0.00  Types: Cigarettes  Quit date: 2012  Years since quitting: 12.7 Smokeless Tobacco Never    Social History  Socioeconomic History  Marital status: Unknown Tobacco Use  Smoking status: Former   Current packs/day: 0.00   Types: Cigarettes   Quit date: 2012   Years since quitting: 12.7  Smokeless tobacco: Never Vaping Use  Vaping status: Never Used Substance and Sexual Activity  Alcohol use: Never  Drug use: Never  Social Determinants of Health   Received from [employer], [facility]  Social Network   Objective:   Vitals:  06/29/23 1002 Pulse: 80 Temp: 37.3 C (99.2 F) SpO2: 98% Weight: (!) 118.6 kg (261 lb 6.4 oz) Height: 182.9 cm (6') PainSc:  4    Exam Gen: NAD Abd: soft Rectal: anterior anal fistula   Labs, Imaging and Diagnostic Testing:   Assessment and Plan: Diagnoses and all orders for this visit:  Anal fistula    42 year old male with approximately 10-year history of of recurrent perirectal abscesses.  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  We discussed performing an exam under anesthesia with possible fistulotomy versus seton placement.  We discussed that we would stay rather conservative given his history of  loose stools.  We discussed typical recovery time and time off of work.  I suggested that if a fistulotomy was performed, he plan to take approximately 3 weeks off work.  Vanita Panda, MD Colon and Rectal Surgery [facility]

## 2023-06-30 ENCOUNTER — Ambulatory Visit: Payer: Self-pay | Admitting: Physician Assistant

## 2023-07-29 DIAGNOSIS — H68102 Unspecified obstruction of Eustachian tube, left ear: Secondary | ICD-10-CM | POA: Diagnosis not present

## 2023-08-09 ENCOUNTER — Encounter (HOSPITAL_BASED_OUTPATIENT_CLINIC_OR_DEPARTMENT_OTHER): Payer: Self-pay | Admitting: General Surgery

## 2023-08-09 NOTE — Progress Notes (Signed)
Spoke w/ via phone for pre-op interview--- Roberto Lewis needs dos---- ISTAT       Lab results------Current EKG in Epic dated 08/24/22 COVID test -----patient states asymptomatic no test needed Arrive at -------1000 NPO after MN NO Solid Food.  Clear liquids from MN until---0900 Med rec completed Medications to take morning of surgery -----NONE Diabetic medication ----- Patient instructed no nail polish to be worn day of surgery Patient instructed to bring photo id and insurance card day of surgery Patient aware to have Driver (ride ) / caregiver    for 24 hours after surgery - Wife-[name] Patient Special Instructions ----- Pre-Op special Instructions ----- Patient verbalized understanding of instructions that were given at this phone interview. Patient denies chest pain, sob, fever, cough at the interview.

## 2023-08-17 NOTE — Anesthesia Preprocedure Evaluation (Signed)
Anesthesia Evaluation  Patient identified by MRN, date of birth, ID band Patient awake    Reviewed: Allergy & Precautions, NPO status , Patient's Chart, lab work & pertinent test results  Airway Mallampati: II  TM Distance: >3 FB Neck ROM: Full    Dental no notable dental hx. (+) Teeth Intact, Dental Advisory Given   Pulmonary former smoker   Pulmonary exam normal breath sounds clear to auscultation       Cardiovascular hypertension, Pt. on medications Normal cardiovascular exam Rhythm:Regular Rate:Normal     Neuro/Psych  Headaches PSYCHIATRIC DISORDERS Anxiety Depression       GI/Hepatic Neg liver ROS,,,  Endo/Other    Renal/GU      Musculoskeletal   Abdominal   Peds  Hematology   Anesthesia Other Findings   Reproductive/Obstetrics                             Anesthesia Physical Anesthesia Plan  ASA: 2  Anesthesia Plan: General   Post-op Pain Management: Precedex, Tylenol PO (pre-op)* and Toradol IV (intra-op)*   Induction: Intravenous  PONV Risk Score and Plan: Treatment may vary due to age or medical condition, Midazolam, Ondansetron and Dexamethasone  Airway Management Planned: Oral ETT  Additional Equipment: None  Intra-op Plan:   Post-operative Plan: Extubation in OR  Informed Consent: I have reviewed the patients History and Physical, chart, labs and discussed the procedure including the risks, benefits and alternatives for the proposed anesthesia with the patient or authorized representative who has indicated his/her understanding and acceptance.     Dental advisory given  Plan Discussed with: CRNA  Anesthesia Plan Comments:        Anesthesia Quick Evaluation

## 2023-08-18 ENCOUNTER — Other Ambulatory Visit: Payer: Self-pay

## 2023-08-18 ENCOUNTER — Encounter (HOSPITAL_BASED_OUTPATIENT_CLINIC_OR_DEPARTMENT_OTHER): Payer: Self-pay | Admitting: General Surgery

## 2023-08-18 ENCOUNTER — Ambulatory Visit (HOSPITAL_BASED_OUTPATIENT_CLINIC_OR_DEPARTMENT_OTHER): Payer: Self-pay | Admitting: Anesthesiology

## 2023-08-18 ENCOUNTER — Encounter (HOSPITAL_BASED_OUTPATIENT_CLINIC_OR_DEPARTMENT_OTHER)
Admission: RE | Disposition: A | Discharge status: Home / Self Care | Payer: Self-pay | Source: Home / Self Care | Attending: General Surgery

## 2023-08-18 ENCOUNTER — Ambulatory Visit (HOSPITAL_BASED_OUTPATIENT_CLINIC_OR_DEPARTMENT_OTHER)
Admission: RE | Admit: 2023-08-18 | Discharge: 2023-08-18 | Disposition: A | Discharge status: Home / Self Care | Payer: BC Managed Care – PPO | Attending: General Surgery | Admitting: General Surgery

## 2023-08-18 DIAGNOSIS — Z79899 Other long term (current) drug therapy: Secondary | ICD-10-CM | POA: Insufficient documentation

## 2023-08-18 DIAGNOSIS — F419 Anxiety disorder, unspecified: Secondary | ICD-10-CM | POA: Insufficient documentation

## 2023-08-18 DIAGNOSIS — F32A Depression, unspecified: Secondary | ICD-10-CM | POA: Diagnosis not present

## 2023-08-18 DIAGNOSIS — K60312 Anal fistula, simple, persistent: Secondary | ICD-10-CM | POA: Diagnosis not present

## 2023-08-18 DIAGNOSIS — Z87891 Personal history of nicotine dependence: Secondary | ICD-10-CM | POA: Insufficient documentation

## 2023-08-18 DIAGNOSIS — I10 Essential (primary) hypertension: Secondary | ICD-10-CM | POA: Diagnosis not present

## 2023-08-18 DIAGNOSIS — K603 Anal fistula, unspecified: Secondary | ICD-10-CM | POA: Diagnosis not present

## 2023-08-18 HISTORY — PX: FISTULOTOMY: SHX6413

## 2023-08-18 HISTORY — DX: Prediabetes: R73.03

## 2023-08-18 HISTORY — PX: ANAL FISTULOTOMY: SHX6423

## 2023-08-18 LAB — POCT I-STAT, CHEM 8
BUN: 21 mg/dL — ABNORMAL HIGH (ref 6–20)
Calcium, Ion: 1.21 mmol/L (ref 1.15–1.40)
Chloride: 103 mmol/L (ref 98–111)
Creatinine, Ser: 1.1 mg/dL (ref 0.61–1.24)
Glucose, Bld: 126 mg/dL — ABNORMAL HIGH (ref 70–99)
HCT: 48 % (ref 39.0–52.0)
Hemoglobin: 16.3 g/dL (ref 13.0–17.0)
Potassium: 5 mmol/L (ref 3.5–5.1)
Sodium: 141 mmol/L (ref 135–145)
TCO2: 27 mmol/L (ref 22–32)

## 2023-08-18 SURGERY — FISTULOTOMY
Anesthesia: General

## 2023-08-18 MED ORDER — CELECOXIB 200 MG PO CAPS
ORAL_CAPSULE | ORAL | Status: AC
  Filled 2023-08-18: qty 2

## 2023-08-18 MED ORDER — MIDAZOLAM HCL 2 MG/2ML IJ SOLN
INTRAMUSCULAR | Status: DC | PRN
  Administered 2023-08-18: 2 mg via INTRAVENOUS

## 2023-08-18 MED ORDER — ROCURONIUM BROMIDE 10 MG/ML (PF) SYRINGE
PREFILLED_SYRINGE | INTRAVENOUS | Status: DC | PRN
  Administered 2023-08-18: 70 mg via INTRAVENOUS

## 2023-08-18 MED ORDER — ACETAMINOPHEN 325 MG RE SUPP: 650.0000 mg | RECTAL | Status: DC | PRN

## 2023-08-18 MED ORDER — SODIUM CHLORIDE 0.9% FLUSH: 3.0000 mL | Freq: Two times a day (BID) | INTRAVENOUS | Status: DC

## 2023-08-18 MED ORDER — SODIUM CHLORIDE 0.9 % IV SOLN: 250.0000 mL | INTRAVENOUS | Status: DC | PRN

## 2023-08-18 MED ORDER — TRAMADOL HCL 50 MG PO TABS: 50.0000 mg | ORAL_TABLET | Freq: Four times a day (QID) | ORAL | 0 refills | Status: AC | PRN

## 2023-08-18 MED ORDER — SODIUM CHLORIDE 0.9 % IV SOLN: INTRAVENOUS | Status: DC

## 2023-08-18 MED ORDER — KETOROLAC TROMETHAMINE 30 MG/ML IJ SOLN: 30.0000 mg | Freq: Once | INTRAMUSCULAR | Status: DC | PRN

## 2023-08-18 MED ORDER — ACETAMINOPHEN 500 MG PO TABS
ORAL_TABLET | ORAL | Status: AC
  Filled 2023-08-18: qty 2

## 2023-08-18 MED ORDER — SCOPOLAMINE 1 MG/3DAYS TD PT72
1.0000 | MEDICATED_PATCH | TRANSDERMAL | Status: DC
  Administered 2023-08-18: 1.5 mg via TRANSDERMAL

## 2023-08-18 MED ORDER — PROPOFOL 10 MG/ML IV BOLUS
INTRAVENOUS | Status: DC | PRN
  Administered 2023-08-18: 270 mg via INTRAVENOUS

## 2023-08-18 MED ORDER — TRAMADOL HCL 50 MG PO TABS: 50.0000 mg | ORAL_TABLET | Freq: Four times a day (QID) | ORAL | 0 refills | Status: DC | PRN

## 2023-08-18 MED ORDER — PROPOFOL 1000 MG/100ML IV EMUL
INTRAVENOUS | Status: AC
  Filled 2023-08-18: qty 100

## 2023-08-18 MED ORDER — MIDAZOLAM HCL 2 MG/2ML IJ SOLN
INTRAMUSCULAR | Status: AC
Start: 2023-08-18 — End: ?
  Filled 2023-08-18: qty 2

## 2023-08-18 MED ORDER — OXYCODONE HCL 5 MG/5ML PO SOLN: 5.0000 mg | Freq: Once | ORAL | Status: DC | PRN

## 2023-08-18 MED ORDER — LIDOCAINE 2% (20 MG/ML) 5 ML SYRINGE
INTRAMUSCULAR | Status: DC | PRN
  Administered 2023-08-18: 100 mg via INTRAVENOUS

## 2023-08-18 MED ORDER — HYDROMORPHONE HCL 1 MG/ML IJ SOLN: 0.2500 mg | INTRAMUSCULAR | Status: DC | PRN

## 2023-08-18 MED ORDER — 0.9 % SODIUM CHLORIDE (POUR BTL) OPTIME
TOPICAL | Status: DC | PRN
  Administered 2023-08-18: 500 mL

## 2023-08-18 MED ORDER — FENTANYL CITRATE (PF) 250 MCG/5ML IJ SOLN
INTRAMUSCULAR | Status: AC
  Filled 2023-08-18: qty 5

## 2023-08-18 MED ORDER — OXYCODONE HCL 5 MG PO TABS: 5.0000 mg | ORAL_TABLET | ORAL | Status: DC | PRN

## 2023-08-18 MED ORDER — ACETAMINOPHEN 325 MG PO TABS: 650.0000 mg | ORAL_TABLET | ORAL | Status: DC | PRN

## 2023-08-18 MED ORDER — OXYCODONE HCL 5 MG PO TABS
5.0000 mg | ORAL_TABLET | Freq: Once | ORAL | Status: DC | PRN
Start: 2023-08-18 — End: 2023-08-18

## 2023-08-18 MED ORDER — ONDANSETRON HCL 4 MG/2ML IJ SOLN
4.0000 mg | Freq: Once | INTRAMUSCULAR | Status: DC | PRN
Start: 2023-08-18 — End: 2023-08-18

## 2023-08-18 MED ORDER — SODIUM CHLORIDE 0.9% FLUSH: 3.0000 mL | INTRAVENOUS | Status: DC | PRN

## 2023-08-18 MED ORDER — DEXMEDETOMIDINE HCL IN NACL 80 MCG/20ML IV SOLN
INTRAVENOUS | Status: DC | PRN
  Administered 2023-08-18: 20 ug via INTRAVENOUS

## 2023-08-18 MED ORDER — ACETAMINOPHEN 500 MG PO TABS
1000.0000 mg | ORAL_TABLET | ORAL | Status: AC
  Administered 2023-08-18: 1000 mg via ORAL

## 2023-08-18 MED ORDER — CELECOXIB 200 MG PO CAPS
200.0000 mg | ORAL_CAPSULE | ORAL | Status: AC
  Administered 2023-08-18: 200 mg via ORAL

## 2023-08-18 MED ORDER — SCOPOLAMINE 1 MG/3DAYS TD PT72
MEDICATED_PATCH | TRANSDERMAL | Status: AC
  Filled 2023-08-18: qty 1

## 2023-08-18 MED ORDER — BUPIVACAINE-EPINEPHRINE 0.5% -1:200000 IJ SOLN
INTRAMUSCULAR | Status: DC | PRN
  Administered 2023-08-18: 30 mL

## 2023-08-18 MED ORDER — PROPOFOL 10 MG/ML IV BOLUS
INTRAVENOUS | Status: AC
  Filled 2023-08-18: qty 20

## 2023-08-18 MED ORDER — ONDANSETRON HCL 4 MG/2ML IJ SOLN
INTRAMUSCULAR | Status: DC | PRN
  Administered 2023-08-18: 4 mg via INTRAVENOUS

## 2023-08-18 MED ORDER — LACTATED RINGERS IV SOLN: INTRAVENOUS | Status: DC

## 2023-08-18 MED ORDER — FENTANYL CITRATE (PF) 250 MCG/5ML IJ SOLN
INTRAMUSCULAR | Status: DC | PRN
  Administered 2023-08-18: 100 ug via INTRAVENOUS
  Administered 2023-08-18 (×2): 25 ug via INTRAVENOUS
  Administered 2023-08-18: 50 ug via INTRAVENOUS

## 2023-08-18 MED ORDER — PROPOFOL 500 MG/50ML IV EMUL
INTRAVENOUS | Status: AC
  Filled 2023-08-18: qty 50

## 2023-08-18 MED ORDER — SUGAMMADEX SODIUM 200 MG/2ML IV SOLN
INTRAVENOUS | Status: DC | PRN
  Administered 2023-08-18: 500 mg via INTRAVENOUS

## 2023-08-18 SURGICAL SUPPLY — 50 items
BENZOIN TINCTURE PRP APPL 2/3 (GAUZE/BANDAGES/DRESSINGS) ×2 IMPLANT
BLADE EXTENDED COATED 6.5IN (ELECTRODE) IMPLANT
BLADE SURG 10 STRL SS (BLADE) IMPLANT
BRIEF MESH DISP LRG (UNDERPADS AND DIAPERS) ×1 IMPLANT
COVER BACK TABLE 60X90IN (DRAPES) ×1 IMPLANT
COVER MAYO STAND STRL (DRAPES) ×1 IMPLANT
DRAPE HYSTEROSCOPY (MISCELLANEOUS) IMPLANT
DRAPE LAPAROTOMY 100X72 PEDS (DRAPES) ×1 IMPLANT
DRAPE SHEET LG 3/4 BI-LAMINATE (DRAPES) IMPLANT
DRAPE UTILITY XL STRL (DRAPES) ×1 IMPLANT
ELECT REM PT RETURN 9FT ADLT (ELECTROSURGICAL) ×1
ELECTRODE REM PT RTRN 9FT ADLT (ELECTROSURGICAL) ×1 IMPLANT
GAUZE 4X4 16PLY ~~LOC~~+RFID DBL (SPONGE) ×1 IMPLANT
GAUZE PAD ABD 8X10 STRL (GAUZE/BANDAGES/DRESSINGS) ×1 IMPLANT
GAUZE SPONGE 4X4 12PLY STRL (GAUZE/BANDAGES/DRESSINGS) ×1 IMPLANT
GLOVE BIO SURGEON STRL SZ 6.5 (GLOVE) ×1 IMPLANT
GLOVE BIOGEL PI IND STRL 6.5 (GLOVE) IMPLANT
GLOVE INDICATOR 6.5 STRL GRN (GLOVE) ×1 IMPLANT
GOWN STRL REUS W/ TWL LRG LVL3 (GOWN DISPOSABLE) IMPLANT
GOWN STRL REUS W/TWL XL LVL3 (GOWN DISPOSABLE) ×1 IMPLANT
HYDROGEN PEROXIDE 16OZ (MISCELLANEOUS) ×1 IMPLANT
IV CATH 14GX2 1/4 (CATHETERS) ×1 IMPLANT
IV CATH 18G SAFETY (IV SOLUTION) ×1 IMPLANT
KIT SIGMOIDOSCOPE (SET/KITS/TRAYS/PACK) IMPLANT
KIT TURNOVER CYSTO (KITS) ×1 IMPLANT
LEGGING LITHOTOMY PAIR STRL (DRAPES) IMPLANT
LOOP VASCLR MAXI BLUE 18IN ST (MISCELLANEOUS) IMPLANT
NDL HYPO 22X1.5 SAFETY MO (MISCELLANEOUS) ×1 IMPLANT
NEEDLE HYPO 22X1.5 SAFETY MO (MISCELLANEOUS) ×1
NS IRRIG 500ML POUR BTL (IV SOLUTION) ×1 IMPLANT
PACK BASIN DAY SURGERY FS (CUSTOM PROCEDURE TRAY) ×1 IMPLANT
PAD ARMBOARD 7.5X6 YLW CONV (MISCELLANEOUS) IMPLANT
PENCIL SMOKE EVACUATOR (MISCELLANEOUS) ×1 IMPLANT
SLEEVE SCD COMPRESS KNEE MED (STOCKING) ×1 IMPLANT
SPIKE FLUID TRANSFER (MISCELLANEOUS) ×1 IMPLANT
SPONGE HEMORRHOID 8X3CM (HEMOSTASIS) IMPLANT
SPONGE SURGIFOAM ABS GEL 12-7 (HEMOSTASIS) IMPLANT
SUCTION TUBE FRAZIER 10FR DISP (SUCTIONS) IMPLANT
SUT CHROMIC 2 0 SH (SUTURE) IMPLANT
SUT CHROMIC 3 0 SH 27 (SUTURE) IMPLANT
SUT ETHIBOND 0 (SUTURE) IMPLANT
SUT VIC AB 2-0 SH 27XBRD (SUTURE) IMPLANT
SUT VIC AB 3-0 SH 18 (SUTURE) IMPLANT
SUT VIC AB 3-0 SH 27XBRD (SUTURE) IMPLANT
SYR CONTROL 10ML LL (SYRINGE) ×1 IMPLANT
TOWEL OR 17X24 6PK STRL BLUE (TOWEL DISPOSABLE) ×1 IMPLANT
TRAY DSU PREP LF (CUSTOM PROCEDURE TRAY) ×1 IMPLANT
TUBE CONNECTING 12X1/4 (SUCTIONS) ×1 IMPLANT
VASCULAR TIE MAXI BLUE 18IN ST (MISCELLANEOUS)
YANKAUER SUCT BULB TIP NO VENT (SUCTIONS) ×1 IMPLANT

## 2023-08-18 NOTE — Op Note (Signed)
08/18/2023  12:13 PM  PATIENT:  Roberto Lewis.  42 y.o. male  Patient Care Team: Patient, No Pcp Per as PCP - General (General Practice)  PRE-OPERATIVE DIAGNOSIS:  ANAL FISTULA  POST-OPERATIVE DIAGNOSIS:  ANAL FISTULA  PROCEDURE:   FISTULOTOMY INTERROGATION OF ANAL FISTULA    Surgeon(s): [name], MD  ASSISTANT: none   ANESTHESIA:   local and general  SPECIMEN:  No Specimen  DISPOSITION OF SPECIMEN:  N/A  COUNTS:  YES  PLAN OF CARE: Discharge to home after PACU  PATIENT DISPOSITION:  PACU - hemodynamically stable.  INDICATION: 42 y.o. M with 10 yr history of anal abscesses.  He was determined to have a fistula.   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  General anesthesia was induced without difficulty. The patient was then positioned in prone jackknife position with buttocks gently taped apart.  The patient was then prepped and draped in usual sterile fashion.  SCDs were noted to be in place prior to the initiation of anesthesia. A surgical timeout was performed indicating the correct patient, procedure, positioning and need for preoperative antibiotics.  A rectal block was performed using Marcaine with epinephrine.    I began with a digital rectal exam.  The anal canal was gently dilated to approximately 2 fingerbreadths.  He had excellent rectal tone.  I then placed a Hill-Ferguson anoscope into the anal canal and evaluated this completely.  I inserted the fistula probe into the external opening.  I was unable to identify the internal opening and injected a small amount of hydrogen peroxide.  I was then able to identify the internal opening and irrigated the tract with saline.  A fistulotomy was then performed using electrocautery.  There was a  small amount of distal internal sphincter fibers (less than 5%) above the fistula tract.  The fistula did not involve any external sphincter.  The tract was marsupialized using a running 2-0 chromic suture.  A skin tag was removed nearby for added healing.  The area was injected with additional Marcaine subcutaneously and then covered with a dressing.  The patient was then awakened from anesthesia and sent to the postanesthesia care unit in stable condition.  All counts were correct per operating room staff.  Vanita Panda, MD  Colorectal and General Surgery [facility] Surgery

## 2023-08-18 NOTE — Anesthesia Postprocedure Evaluation (Signed)
Anesthesia Post Note  Patient: Gareth Porteous.  Procedure(s) Performed: FISTULOTOMY EVALUATION OF ANAL FISTULA     Patient location during evaluation: PACU Anesthesia Type: General Level of consciousness: awake and alert Pain management: pain level controlled Vital Signs Assessment: post-procedure vital signs reviewed and stable Respiratory status: spontaneous breathing, nonlabored ventilation, respiratory function stable and patient connected to nasal cannula oxygen Cardiovascular status: blood pressure returned to baseline and stable Postop Assessment: no apparent nausea or vomiting Anesthetic complications: no   No notable events documented.  Last Vitals:  Vitals:   08/18/23 1315 08/18/23 1410  BP: 132/89 134/81  Pulse:  79  Resp:  18  Temp:  36.7 C  SpO2:  98%    Last Pain:  Vitals:   08/18/23 1410  TempSrc: Oral  PainSc: 2                  Trevor Iha

## 2023-08-18 NOTE — Discharge Instructions (Addendum)
Beginning the day after surgery:  You may sit in a tub of warm water 2-3 times a day to relieve discomfort.  Eat a regular diet high in fiber.  Avoid foods that give you constipation or diarrhea.  Avoid foods that are difficult to digest, such as seeds, nuts, corn or popcorn.  Do not go any longer than 2 days without a bowel movement.  You may take a dose of Milk of Magnesia if you become constipated.    Drink 6-8 glasses of water daily.  Walking is encouraged.  Avoid strenuous activity and heavy lifting for one month after surgery.    Call the office if you have any questions or concerns.  Call immediately if you develop:   Excessive rectal bleeding (more than a cup or passing large clots)  Increased discomfort  Fever greater than 100 F  Difficulty urinating   Post Anesthesia Home Care Instructions  Activity: Get plenty of rest for the remainder of the day. A responsible individual must stay with you for 24 hours following the procedure.  For the next 24 hours, DO NOT: -Drive a car -Advertising copywriter -Drink alcoholic beverages -Take any medication unless instructed by your physician -Make any legal decisions or sign important papers.  Meals: Start with liquid foods such as gelatin or soup. Progress to regular foods as tolerated. Avoid greasy, spicy, heavy foods. If nausea and/or vomiting occur, drink only clear liquids until the nausea and/or vomiting subsides. Call your physician if vomiting continues.  Special Instructions/Symptoms: Your throat may feel dry or sore from the anesthesia or the breathing tube placed in your throat during surgery. If this causes discomfort, gargle with warm salt water. The discomfort should disappear within 24 hours.  If you had a scopolamine patch placed behind your ear for the management of post- operative nausea and/or vomiting:  1. The medication in the patch is effective for 72 hours, after which it should be removed.  Wrap patch in  a tissue and discard in the trash. Wash hands thoroughly with soap and water. 2. You may remove the patch earlier than 72 hours if you experience unpleasant side effects which may include dry mouth, dizziness or visual disturbances. 3. Avoid touching the patch. Wash your hands with soap and water after contact with the patch.

## 2023-08-18 NOTE — Anesthesia Procedure Notes (Signed)
Procedure Name: Intubation Date/Time: 08/18/2023 10:48 AM  Performed by: Dairl Ponder, CRNAPre-anesthesia Checklist: Patient identified, Emergency Drugs available, Suction available and Patient being monitored Patient Re-evaluated:Patient Re-evaluated prior to induction Oxygen Delivery Method: Circle System Utilized Preoxygenation: Pre-oxygenation with 100% oxygen Induction Type: IV induction Ventilation: Mask ventilation without difficulty Laryngoscope Size: Mac and 4 Grade View: Grade I Tube type: Oral Tube size: 7.5 mm Number of attempts: 1 Airway Equipment and Method: Stylet and Oral airway Placement Confirmation: ETT inserted through vocal cords under direct vision, positive ETCO2 and breath sounds checked- equal and bilateral Secured at: 24 cm Tube secured with: Tape Dental Injury: Teeth and Oropharynx as per pre-operative assessment

## 2023-08-18 NOTE — Transfer of Care (Signed)
Immediate Anesthesia Transfer of Care Note  Patient: Roberto Lewis.  Procedure(s) Performed: FISTULOTOMY EVALUATION OF ANAL FISTULA  Patient Location: PACU  Anesthesia Type:General  Level of Consciousness: drowsy and patient cooperative  Airway & Oxygen Therapy: Patient Spontanous Breathing and Patient connected to nasal cannula oxygen  Post-op Assessment: Report given to RN and Post -op Vital signs reviewed and stable  Post vital signs: Reviewed and stable  Last Vitals:  Vitals Value Taken Time  BP 149/94 08/18/23 1235  Temp    Pulse 92 08/18/23 1239  Resp 13 08/18/23 1237  SpO2 96 % 08/18/23 1239  Vitals shown include unfiled device data.  Last Pain:  Vitals:   08/18/23 0945  TempSrc: Oral  PainSc: 4       Patients Stated Pain Goal: 4 (08/18/23 0945)  Complications: No notable events documented.

## 2023-08-18 NOTE — H&P (Signed)
PROVIDER:  Elenora Gamma, MD   MRN: [id number] DOB: 1981/04/05    Subjective   Chief Complaint: history of anorectal abscesses       History of Present Illness:   Roberto Lewis is a 42 y.o. 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  These have been mostly drained in the office.   MRI November 2020 showed no sign of fistula, but due to recurrent episodes, I recommended EUA, but he did not have insurance at that time and did not pursue this.     In June 2021, he underwent I&D of right anterior/perineal region. He was seen in Nov 2021 for recurrent symptoms but no obvious abscess was seen.  He was prescribed Cipro/Flagyl.   He was seen again in Nov 2021 with recurrent perineal pain/swelling.  It had drained twice over the previous days.  Antibiotics were perscribed again to help with the infection resolved.   He was last seen in March 2024 at which time there was some swelling on the right perineum with bluish discoloration under the skin.  He underwent bedside I&D releasing a few cc of bloody/purulent fluid and 2 large blood clots.  Since then, he states he has had 2-3 flares in the perineum.  He states 2 days ago the area started to swell.  Yesterday, he states the swelling was about the size of a golf ball.  Overnight, the swelling decreased but he denies any external drainage.  He has been doing warm tub soaks.  He states he still has about 3-4 loose bowel movements daily at baseline.  Patient having a lot of left lower quadrant pain as well.  He is set  to see [facility] tomorrow to be evaluated for colonoscopy.     Review of Systems: A complete review of systems was obtained from the patient.  I have reviewed this information and discussed as appropriate with the patient.  See HPI as well for other ROS.     Medical History: Past Medical History: DiagnosisDate            Anxiety                 There is no problem list on file for  this patient.     Past Surgical History: ProcedureLateralityDate            APPENDECTOMY                  09/08/2015             Dr. Carolynne Edouard     Allergies AllergenReactions VenlafaxineAnaphylaxis, Other (See Comments) and Swelling                         Throat swelling, blurred vision       Current Outpatient Medications on File Prior to Visit MedicationSigDispenseRefill            ARIPiprazole (ABILIFY) 10 MG tablet            Take by mouth                           No current facility-administered medications on file prior to visit.     Family History ProblemRelationAge of Onset            Breast cancer  Mother  Deep vein thrombosis (DVT or abnormal blood clot formation)        Father              Hyperlipidemia (Elevated cholesterol)            Father       Social History   Tobacco Use Smoking StatusFormer Current packs/day:0.00 Types:Cigarettes Quit date:2012 Years since quitting:12.7 Smokeless TobaccoNever     Social History   Socioeconomic History Marital status:Unknown Tobacco Use Smoking status:Former                         Current packs/day:      0.00                         Types: Cigarettes                         Quit date:        2012                         Years since quitting:   12.7 Smokeless tobacco:Never Vaping Use Vaping status:Never Used Substance and Sexual Activity Alcohol ZOX:WRUEA Drug VWU:JWJXB   Social Determinants of Health               Received from [employer], [facility]             Social Network     Objective:     Vitals:     Vitals:   08/18/23 0945  BP: (!) 162/99  Pulse: (!) 104  Resp: 18  Temp: 98.1 F (36.7 C)  SpO2: 99%      Exam Gen: NAD Abd: soft Rectal: anterior anal fistula     Labs, Imaging and Diagnostic Testing:     Assessment and Plan: Diagnoses and all orders for this visit:   Anal fistula     42 year old male with approximately 10-year history of of recurrent  perirectal abscesses.  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  We discussed performing an exam under anesthesia with possible fistulotomy versus seton placement.  We discussed that we would stay rather conservative given his history of loose stools.  We discussed typical recovery time and time off of work.  I suggested that if a fistulotomy was performed, he plan to take approximately 3 weeks off work.   Vanita Panda, MD Colon and Rectal Surgery [facility]

## 2023-08-23 ENCOUNTER — Encounter (HOSPITAL_BASED_OUTPATIENT_CLINIC_OR_DEPARTMENT_OTHER): Payer: Self-pay | Admitting: General Surgery

## 2023-09-20 ENCOUNTER — Telehealth (HOSPITAL_COMMUNITY): Payer: Self-pay

## 2023-09-20 DIAGNOSIS — F3341 Major depressive disorder, recurrent, in partial remission: Secondary | ICD-10-CM

## 2023-09-20 DIAGNOSIS — F411 Generalized anxiety disorder: Secondary | ICD-10-CM

## 2023-09-20 MED ORDER — ARIPIPRAZOLE 10 MG PO TABS: 10.0000 mg | ORAL_TABLET | Freq: Every day | ORAL | 0 refills | Status: DC

## 2023-09-20 NOTE — Telephone Encounter (Signed)
Medication management - Telephone call with patient to inform Dr. Renaldo Fiddler sent in a one time 30 day refill order of his prescribed Abilify 10 mg tablets to his [facility] and patient to keep his next appointment with Dr. Doyne Keel on 10/08/23. Patient agreed with plan.

## 2023-09-20 NOTE — Telephone Encounter (Signed)
Received message that patient needed a refill of his medication.  He had last been seen 3/25.  He has been scheduled with Dr. Doyne Keel for 10/08/2023.  Will send in a one month bridge.   Sent: -Abilify 10 mg daily.  30 tablets with 0 refills.   Arna Snipe MD Resident

## 2023-09-20 NOTE — Telephone Encounter (Signed)
Appointment and medication refill - Call from patient requesting a new Abilify order be sent into his [facility]. informed patient he had not been since since 12/14/22 and no showed for his last appt 03/18/23. Patient stated he was not aware of the missed appointment since he had a 6 month supply of is prescribe Aripiprazole so requested a one time refill be sent in by Dr. Renaldo Fiddler and to be rescheduled for a new appointment with provider.  Agreed to have staff call patient back to get scheduled and to send Dr. Renaldo Fiddler a message with his one time refill request. Patient verified his phone number as [phone].

## 2023-10-08 ENCOUNTER — Encounter (HOSPITAL_COMMUNITY): Payer: Self-pay | Admitting: Psychiatry

## 2023-10-08 ENCOUNTER — Ambulatory Visit (INDEPENDENT_AMBULATORY_CARE_PROVIDER_SITE_OTHER): Payer: BC Managed Care – PPO | Admitting: Psychiatry

## 2023-10-08 DIAGNOSIS — F3341 Major depressive disorder, recurrent, in partial remission: Secondary | ICD-10-CM

## 2023-10-08 MED ORDER — ARIPIPRAZOLE 10 MG PO TABS: 10.0000 mg | ORAL_TABLET | Freq: Every day | ORAL | 3 refills | Status: DC

## 2023-10-08 NOTE — Progress Notes (Signed)
BH MD/PA/NP OP Progress Note Virtual Visit via Telephone Note  I connected with Glenice Bow. on 10/08/23 at  9:00 AM EST by telephone and verified that I am speaking with the correct person using two identifiers.  Location: Patient: home Provider: Clinic   I discussed the limitations, risks, security and privacy concerns of performing an evaluation and management service by telephone and the availability of in person appointments. I also discussed with the patient that there may be a patient responsible charge related to this service. The patient expressed understanding and agreed to proceed.   I provided 30 minutes of non-face-to-face time during this encounter.             10/08/2023 9:09 AM Glenice Bow.  MRN:  [id number]  Chief Complaint: "I am doing okay"  HPI: 43 year old male seen today for follow up psychiatric evaluation. He has a psychiatric history or anxiety and depression.   He is currently managed on Abilify 10 mg daily and reports that this medication is effective to conditions.  Today he was well-groomed, pleasant, calm, cooperative, and engaged in conversation.  He informed Clinical research associate that he has been doing good.  He does note that he is somewhat concerned about his 64-year-old who has been having increased anxiety and anger issues.  Patient notes that he feels that his anger issues may have triggered his son and he reports that he is trying to work on it.  He does know that his son goes for testing in February.  Despite this stressor patient notes that his anxiety and depression continues to be well-managed.  Today provider conducted a GAD-7 and patient scored a 3. Provider also conducted PHQ-9 he scored a 1.  He endorses adequate sleep and appetite.  Today he denies SI/HI/VAH, mania, or paranoia.  No medication changes made today. Patient is agreeable to continue all medications as prescribed. No other concerns noted at this time.   Visit Diagnosis:     ICD-10-CM   1. Recurrent major depressive disorder, in partial remission (HCC)  F33.41 ARIPiprazole (ABILIFY) 10 MG tablet       Past Psychiatric History: Depression and anxiety.   Past Medical History:  Past Medical History:  Diagnosis Date   Anxiety    (f/b by psych)   Hypertension    Migraine    Pre-diabetes    Smoker    former    Past Surgical History:  Procedure Laterality Date   ANAL FISTULOTOMY N/A 08/18/2023   Procedure: EVALUATION OF ANAL FISTULA;  Surgeon: [name], MD;  Location: [facility];  Service: General;  Laterality: N/A;   FISTULOTOMY N/A 08/18/2023   Procedure: FISTULOTOMY;  Surgeon: [name], MD;  Location: [facility];  Service: General;  Laterality: N/A;   INCISION AND DRAINAGE ABSCESS ANAL  04/10/2011   Dr Daphine Deutscher   INCISION AND DRAINAGE ABSCESS ANAL  May 2013   Dr Magnus Ivan   INCISION AND DRAINAGE ABSCESS ANAL  09/15/2012   Dr Biagio Quint   LAPAROSCOPIC APPENDECTOMY N/A 09/08/2015   Procedure: APPENDECTOMY LAPAROSCOPIC;  Surgeon: Chevis Pretty III, MD;  Location: [facility];  Service: General;  Laterality: N/A;    Family Psychiatric History: Father Bipolar disoder  Family History:  Family History  Problem Relation Age of Onset   COPD Father    Heart disease Sister        congenital    Social History:  Social History   Socioeconomic History   Marital status:  Married    Spouse name: Not on file   Number of children: 1   Years of education: Not on file   Highest education level: Not on file  Occupational History   Occupation: Forensic scientist: [employer]  Tobacco Use   Smoking status: Former    Current packs/day: 0.00    Types: Cigarettes    Quit date: 03/29/2011    Years since quitting: 12.5   Smokeless tobacco: Current    Types: Chew  Vaping Use   Vaping status: Never Used  Substance and Sexual Activity   Alcohol use: No   Drug use: No   Sexual activity: Yes  Other Topics Concern    Not on file  Social History Narrative   Not on file   Social Drivers of Health   Financial Resource Strain: Not on file  Food Insecurity: Not on file  Transportation Needs: Not on file  Physical Activity: Not on file  Stress: Not on file  Social Connections: Unknown (01/26/2022)   Received from [facility]   Social Network    Social Network: Not on file    Allergies:  Allergies  Allergen Reactions   Effexor [Venlafaxine Hydrochloride] Swelling and Other (See Comments)    Throat swelling, blurred vision   Morphine Nausea And Vomiting   Prednisone Other (See Comments)    "Hallucinations"     Metabolic Disorder Labs: Lab Results  Component Value Date   HGBA1C 5.7 (H) 12/16/2015   MPG 117 12/16/2015   MPG 117 (H) 07/18/2015   Lab Results  Component Value Date   PROLACTIN 7.5 07/25/2015   No results found for: "CHOL", "TRIG", "HDL", "CHOLHDL", "VLDL", "LDLCALC" Lab Results  Component Value Date   TSH 1.050 07/18/2015   TSH 1.295 10/20/2013    Therapeutic Level Labs: No results found for: "LITHIUM" No results found for: "VALPROATE" No results found for: "CBMZ"  Current Medications: Current Outpatient Medications  Medication Sig Dispense Refill   acetaminophen (TYLENOL) 325 MG tablet Take 650 mg by mouth every 6 (six) hours as needed for moderate pain.     ARIPiprazole (ABILIFY) 10 MG tablet Take 1 tablet (10 mg total) by mouth at bedtime. 30 tablet 3   ibuprofen (ADVIL) 200 MG tablet Take 600 mg by mouth every 6 (six) hours as needed for moderate pain.     OVER THE COUNTER MEDICATION Take 6 mg by mouth as needed (dipping sensation). Zyn nicotine pouch     traMADol (ULTRAM) 50 MG tablet Take 1 tablet (50 mg total) by mouth every 6 (six) hours as needed (pain). 20 tablet 0   No current facility-administered medications for this visit.     Musculoskeletal: Strength & Muscle Tone: within normal limits Gait & Station: normal Patient leans:  N/A  Psychiatric Specialty Exam: Review of Systems  There were no vitals taken for this visit.There is no height or weight on file to calculate BMI.  General Appearance: Well Groomed  Eye Contact:  Good  Speech:  Clear and Coherent and Normal Rate  Volume:  Normal  Mood:  Euthymic  Affect:  Appropriate and Congruent  Thought Process:  Coherent, Goal Directed and Linear  Orientation:  Full (Time, Place, and Person)  Thought Content: WDL and Logical   Suicidal Thoughts:  No  Homicidal Thoughts:  No  Memory:  Immediate;   Good Recent;   Good Remote;   Good  Judgement:  Good  Insight:  Good  Psychomotor  Activity:  Normal  Concentration:  Concentration: Good and Attention Span: Good  Recall:  Good  Fund of Knowledge: Good  Language: Good  Akathisia:  No  Handed:  Right  AIMS (if indicated): Not done   Assets:  Communication Skills Desire for Improvement Financial Resources/Insurance Housing Intimacy Social Support  ADL's:  Intact  Cognition: WNL  Sleep:  Good   Screenings: GAD-7    Flowsheet Row Office Visit from 10/08/2023 in [facility] Video Visit from 06/17/2022 in [facility] Video Visit from 03/17/2022 in [facility] Video Visit from 08/27/2021 in [facility] Video Visit from 05/21/2021 in [facility]  Total GAD-7 Score 3 4 3 4 4       PHQ2-9    Flowsheet Row Office Visit from 10/08/2023 in [facility] Video Visit from 06/17/2022 in [facility] Video Visit from 03/17/2022 in [facility] Video Visit from 08/27/2021 in [facility] Video Visit from 05/21/2021 in [facility]  PHQ-2 Total Score 0 0 2 0 0  PHQ-9 Total Score 1 0 3 1 1       Flowsheet Row Admission (Discharged) from 08/18/2023 in  [facility] ED from 11/11/2022 in [facility] Emergency Department at [facility] ED from 08/19/2022 in [facility] Emergency Department at [facility]  C-SSRS RISK CATEGORY No Risk No Risk No Risk        Assessment and Plan: Patient notes that he is doing well on his current medication regimen.  No medication changes made today.  Patient agreeable to continue all medications as prescribed.  1. Recurrent major depressive disorder, in partial remission (HCC)  Continue- ARIPiprazole (ABILIFY) 10 MG tablet; Take 1 tablet (10 mg total) by mouth at bedtime.  Dispense: 30 tablet; Refill: 3    Follow-up in 2.5 months   Shanna Cisco, NP 10/08/2023, 9:09 AM

## 2023-11-18 DIAGNOSIS — Z1330 Encounter for screening examination for mental health and behavioral disorders, unspecified: Secondary | ICD-10-CM | POA: Diagnosis not present

## 2023-11-18 DIAGNOSIS — Z130 Encounter for screening for diseases of the blood and blood-forming organs and certain disorders involving the immune mechanism: Secondary | ICD-10-CM | POA: Diagnosis not present

## 2023-11-18 DIAGNOSIS — Z13220 Encounter for screening for lipoid disorders: Secondary | ICD-10-CM | POA: Diagnosis not present

## 2023-11-18 DIAGNOSIS — Z0000 Encounter for general adult medical examination without abnormal findings: Secondary | ICD-10-CM | POA: Diagnosis not present

## 2023-11-18 DIAGNOSIS — Z131 Encounter for screening for diabetes mellitus: Secondary | ICD-10-CM | POA: Diagnosis not present

## 2023-12-05 DIAGNOSIS — L720 Epidermal cyst: Secondary | ICD-10-CM | POA: Diagnosis not present

## 2023-12-09 DIAGNOSIS — L720 Epidermal cyst: Secondary | ICD-10-CM | POA: Diagnosis not present

## 2023-12-10 ENCOUNTER — Telehealth (HOSPITAL_COMMUNITY): Payer: Self-pay | Admitting: Psychiatry

## 2023-12-10 ENCOUNTER — Encounter (HOSPITAL_COMMUNITY): Payer: Self-pay | Admitting: Psychiatry

## 2023-12-10 DIAGNOSIS — F17220 Nicotine dependence, chewing tobacco, uncomplicated: Secondary | ICD-10-CM

## 2023-12-10 DIAGNOSIS — F3341 Major depressive disorder, recurrent, in partial remission: Secondary | ICD-10-CM

## 2023-12-10 MED ORDER — NICOTINE 21 MG/24HR TD PT24: 21.0000 mg | MEDICATED_PATCH | Freq: Every day | TRANSDERMAL | 3 refills | Status: DC

## 2023-12-10 MED ORDER — ARIPIPRAZOLE 10 MG PO TABS: 10.0000 mg | ORAL_TABLET | Freq: Every day | ORAL | 3 refills | Status: DC

## 2023-12-10 NOTE — Progress Notes (Signed)
 BH MD/PA/NP OP Progress Note Virtual Visit via Video Note  I connected with Roberto Bow. on 12/10/23 at  9:00 AM EDT by a video enabled telemedicine application and verified that I am speaking with the correct person using two identifiers.  Location: Patient: Home Provider: Clinic   I discussed the limitations of evaluation and management by telemedicine and the availability of in person appointments. The patient expressed understanding and agreed to proceed.  I provided 30 minutes of non-face-to-face time during this encounter.             12/10/2023 9:15 AM Roberto Bow.  MRN:  [id number]  Chief Complaint: "My son has a lot of anxiety"  HPI: 43 year old male seen today for follow up psychiatric evaluation. He has a psychiatric history or anxiety and depression.   He is currently managed on Abilify 10 mg daily and reports that this medication is effective to conditions.  Today he was well-groomed, pleasant, calm, cooperative, and engaged in conversation.  He informed Clinical research associate that he has been doing good.  He notes that his 54 year old son has anxiety and is having to be homeschooled.  Recently he notes that he was placed on Abilify and notes that he is doing somewhat better.  Patient reports that at times it is put stress on his wife and him but reports that they are able to cope with it.  Since his last visit he informed writer that his anxiety and depression are well-managed. Today provider conducted a GAD-7 and patient scored a 4, at his last visit he scored a 3. Provider also conducted PHQ-9 he scored a 1, at his last visit he scored a.  He endorses adequate sleep and appetite.  He reports that he has been dieting and has lost 5 pounds since his last visit with a high-protein low-carb regimen.  Today he denies SI/HI/VAH, mania, or paranoia.  Patient notes that he stopped smoking cigarettes years ago however notes that he has been chewing tobacco for the last 2 years and  would like to stop.  Today patient agreeable to starting Nicorette CQ 21 mg patches to help manage tobacco dependence.  Patient does not have current labs.  Today provider ordered CBC, CMP, thyroid panel, lipid panel, LFT, prolactin level, and HgbA1c.  No other concerns noted at this time.   Visit Diagnosis:    ICD-10-CM   1. Tobacco dependence due to chewing tobacco  F17.220 nicotine (NICODERM CQ) 21 mg/24hr patch    2. Recurrent major depressive disorder, in partial remission (HCC)  F33.41 ARIPiprazole (ABILIFY) 10 MG tablet    CBC    Hepatic function panel    Thyroid Panel With TSH    Comprehensive Metabolic Panel (CMET)    Lipid Profile    HgB A1c    Prolactin        Past Psychiatric History: Depression and anxiety.   Past Medical History:  Past Medical History:  Diagnosis Date   Anxiety    (f/b by psych)   Hypertension    Migraine    Pre-diabetes    Smoker    former    Past Surgical History:  Procedure Laterality Date   ANAL FISTULOTOMY N/A 08/18/2023   Procedure: EVALUATION OF ANAL FISTULA;  Surgeon: [name], MD;  Location: [facility];  Service: General;  Laterality: N/A;   FISTULOTOMY N/A 08/18/2023   Procedure: FISTULOTOMY;  Surgeon: [name], MD;  Location: [facility];  Service:  General;  Laterality: N/A;   INCISION AND DRAINAGE ABSCESS ANAL  04/10/2011   Dr Daphine Deutscher   INCISION AND DRAINAGE ABSCESS ANAL  May 2013   Dr Magnus Ivan   INCISION AND DRAINAGE ABSCESS ANAL  09/15/2012   Dr Biagio Quint   LAPAROSCOPIC APPENDECTOMY N/A 09/08/2015   Procedure: APPENDECTOMY LAPAROSCOPIC;  Surgeon: Chevis Pretty III, MD;  Location: [facility];  Service: General;  Laterality: N/A;    Family Psychiatric History: Father Bipolar disoder  Family History:  Family History  Problem Relation Age of Onset   COPD Father    Heart disease Sister        congenital    Social History:  Social History   Socioeconomic History   Marital status:  Married    Spouse name: Not on file   Number of children: 1   Years of education: Not on file   Highest education level: Not on file  Occupational History   Occupation: Forensic scientist: [employer]  Tobacco Use   Smoking status: Former    Current packs/day: 0.00    Types: Cigarettes    Quit date: 03/29/2011    Years since quitting: 12.7   Smokeless tobacco: Current    Types: Chew  Vaping Use   Vaping status: Never Used  Substance and Sexual Activity   Alcohol use: No   Drug use: No   Sexual activity: Yes  Other Topics Concern   Not on file  Social History Narrative   Not on file   Social Drivers of Health   Financial Resource Strain: Low Risk  (11/17/2023)   Received from [facility]   Overall Financial Resource Strain (CARDIA)    Difficulty of Paying Living Expenses: Not very hard  Food Insecurity: No Food Insecurity (11/17/2023)   Received from [facility]   Hunger Vital Sign    Worried About Running Out of Food in the Last Year: Never true    Ran Out of Food in the Last Year: Never true  Transportation Needs: No Transportation Needs (11/17/2023)   Received from [facility] - Transportation    Lack of Transportation (Medical): No    Lack of Transportation (Non-Medical): No  Physical Activity: Sufficiently Active (11/17/2023)   Received from [facility]   Exercise Vital Sign    Days of Exercise per Week: 5 days    Minutes of Exercise per Session: 150+ min  Stress: No Stress Concern Present (11/17/2023)   Received from [facility] - Occupational Stress Questionnaire    Feeling of Stress : Not at all  Social Connections: Socially Integrated (11/17/2023)   Received from [facility]   Social Network    How would you rate your social network (family, work, friends)?: Good participation with social networks    Allergies:  Allergies  Allergen Reactions   Effexor [Venlafaxine  Hydrochloride] Swelling and Other (See Comments)    Throat swelling, blurred vision   Morphine Nausea And Vomiting   Prednisone Other (See Comments)    "Hallucinations"     Metabolic Disorder Labs: Lab Results  Component Value Date   HGBA1C 5.7 (H) 12/16/2015   MPG 117 12/16/2015   MPG 117 (H) 07/18/2015   Lab Results  Component Value Date   PROLACTIN 7.5 07/25/2015   No results found for: "CHOL", "TRIG", "HDL", "CHOLHDL", "VLDL", "LDLCALC" Lab Results  Component Value Date   TSH 1.050 07/18/2015   TSH 1.295 10/20/2013  Therapeutic Level Labs: No results found for: "LITHIUM" No results found for: "VALPROATE" No results found for: "CBMZ"  Current Medications: Current Outpatient Medications  Medication Sig Dispense Refill   nicotine (NICODERM CQ) 21 mg/24hr patch Place 1 patch (21 mg total) onto the skin daily. 28 patch 3   acetaminophen (TYLENOL) 325 MG tablet Take 650 mg by mouth every 6 (six) hours as needed for moderate pain.     ARIPiprazole (ABILIFY) 10 MG tablet Take 1 tablet (10 mg total) by mouth at bedtime. 30 tablet 3   ibuprofen (ADVIL) 200 MG tablet Take 600 mg by mouth every 6 (six) hours as needed for moderate pain.     OVER THE COUNTER MEDICATION Take 6 mg by mouth as needed (dipping sensation). Zyn nicotine pouch     traMADol (ULTRAM) 50 MG tablet Take 1 tablet (50 mg total) by mouth every 6 (six) hours as needed (pain). 20 tablet 0   No current facility-administered medications for this visit.     Musculoskeletal: Strength & Muscle Tone: within normal limits Gait & Station: normal Patient leans: N/A  Psychiatric Specialty Exam: Review of Systems  There were no vitals taken for this visit.There is no height or weight on file to calculate BMI.  General Appearance: Well Groomed  Eye Contact:  Good  Speech:  Clear and Coherent and Normal Rate  Volume:  Normal  Mood:  Euthymic  Affect:  Appropriate and Congruent  Thought Process:  Coherent, Goal  Directed and Linear  Orientation:  Full (Time, Place, and Person)  Thought Content: WDL and Logical   Suicidal Thoughts:  No  Homicidal Thoughts:  No  Memory:  Immediate;   Good Recent;   Good Remote;   Good  Judgement:  Good  Insight:  Good  Psychomotor Activity:  Normal  Concentration:  Concentration: Good and Attention Span: Good  Recall:  Good  Fund of Knowledge: Good  Language: Good  Akathisia:  No  Handed:  Right  AIMS (if indicated): Not done   Assets:  Communication Skills Desire for Improvement Financial Resources/Insurance Housing Intimacy Social Support  ADL's:  Intact  Cognition: WNL  Sleep:  Good   Screenings: GAD-7    Flowsheet Row Video Visit from 12/10/2023 in [facility] Office Visit from 10/08/2023 in [facility] Video Visit from 06/17/2022 in [facility] Video Visit from 03/17/2022 in [facility] Video Visit from 08/27/2021 in [facility]  Total GAD-7 Score 4 3 4 3 4       PHQ2-9    Flowsheet Row Video Visit from 12/10/2023 in [facility] Office Visit from 10/08/2023 in [facility] Video Visit from 06/17/2022 in [facility] Video Visit from 03/17/2022 in [facility] Video Visit from 08/27/2021 in [facility]  PHQ-2 Total Score 0 0 0 2 0  PHQ-9 Total Score 1 1 0 3 1      Flowsheet Row Admission (Discharged) from 08/18/2023 in [facility] ED from 11/11/2022 in [facility] Emergency Department at [facility] ED from 08/19/2022 in [facility] Emergency Department at [facility]  C-SSRS RISK CATEGORY No Risk No Risk No Risk        Assessment and Plan: Patient notes that he stopped smoking cigarettes years ago however notes that he has been chewing tobacco for the  last 2 years and would  like to stop.Today patient agreeable to starting Nicorette CQ 21 mg patches to help manage tobacco dependence.  Patient does not have current labs.  Today provider ordered CBC, CMP, thyroid panel, lipid panel, LFT, prolactin level, and HgbA1c.  1. Recurrent major depressive disorder, in partial remission (HCC)  Continue- ARIPiprazole (ABILIFY) 10 MG tablet; Take 1 tablet (10 mg total) by mouth at bedtime.  Dispense: 30 tablet; Refill: 3 - CBC; Future - Hepatic function panel; Future - Thyroid Panel With TSH; Future - Comprehensive Metabolic Panel (CMET); Future - Lipid Profile; Future - HgB A1c; Future - Prolactin; Future  2. 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  Dispense: 28 patch; Refill: 3       Follow-up in 2.5 months   Shanna Cisco, NP 12/10/2023, 9:15 AM

## 2024-02-09 ENCOUNTER — Encounter (HOSPITAL_COMMUNITY): Payer: Self-pay | Admitting: Psychiatry

## 2024-02-09 ENCOUNTER — Telehealth (INDEPENDENT_AMBULATORY_CARE_PROVIDER_SITE_OTHER): Admitting: Psychiatry

## 2024-02-09 DIAGNOSIS — F17220 Nicotine dependence, chewing tobacco, uncomplicated: Secondary | ICD-10-CM

## 2024-02-09 DIAGNOSIS — F3341 Major depressive disorder, recurrent, in partial remission: Secondary | ICD-10-CM

## 2024-02-09 MED ORDER — NICOTINE 21 MG/24HR TD PT24: 21.0000 mg | MEDICATED_PATCH | Freq: Every day | TRANSDERMAL | 3 refills | Status: DC

## 2024-02-09 MED ORDER — ARIPIPRAZOLE 10 MG PO TABS
10.0000 mg | ORAL_TABLET | Freq: Every day | ORAL | 3 refills | Status: DC
Start: 2024-02-09 — End: 2024-03-27

## 2024-02-09 NOTE — Progress Notes (Signed)
 [facility] MD/PA/NP OP Progress Note Virtual Visit via Video Note  I connected with Roberto Lewis. on 02/09/24 at  8:30 AM EDT by a video enabled telemedicine application and verified that I am speaking with the correct person using two identifiers.  Location: Patient: Home Provider: Clinic   I discussed the limitations of evaluation and management by telemedicine and the availability of in person appointments. The patient expressed understanding and agreed to proceed.  I provided 30 minutes of non-face-to-face time during this encounter.             02/09/2024 8:45 AM Roberto Lewis.  MRN:  [id number]  Chief Complaint: "Things are stressful with my son"  HPI: 43 year old male seen today for follow up psychiatric evaluation. He has a psychiatric history or anxiety and depression.   He is currently managed on Abilify  10 mg daily and Nicorette CQ patches.  He reports that this medication is effective to conditions.  Today he was well-groomed, pleasant, calm, cooperative, and engaged in conversation.  He informed Clinical research associate that things are stressful with his son. He notes that he continues to be home schooled by his wife but also notes that she works a full time which is taxing on her.  Provider as patient had hearing thought about putting his son into his stem school program which is a resource for children who are homeschooled.  He notes that he had not but will look into it.   Despite the above stressors patient reports that he himself is doing mentally well. Today provider conducted a GAD-7 and patient scored a 5, at his last visit he scored a 4. Provider also conducted PHQ-9 he scored a 1, at his last visit he scored a 1.  He endorses adequate sleep and appetite. Today he denies SI/HI/VAH, mania, or paranoia.  No medication changes made today.  Patient agreeable to continue medication as prescribed.  No other concerns noted at this time.   Visit Diagnosis:    ICD-10-CM   1.  Recurrent major depressive disorder, in partial remission (HCC)  F33.41 ARIPiprazole  (ABILIFY ) 10 MG tablet    2. Tobacco dependence due to chewing tobacco  F17.220 nicotine  (NICODERM CQ ) 21 mg/24hr patch         Past Psychiatric History: Depression and anxiety.   Past Medical History:  Past Medical History:  Diagnosis Date   Anxiety    (f/b by psych)   Hypertension    Migraine    Pre-diabetes    Smoker    former    Past Surgical History:  Procedure Laterality Date   ANAL FISTULOTOMY N/A 08/18/2023   Procedure: EVALUATION OF ANAL FISTULA;  Surgeon: Joyce Nixon, MD;  Location: [facility];  Service: General;  Laterality: N/A;   FISTULOTOMY N/A 08/18/2023   Procedure: FISTULOTOMY;  Surgeon: Joyce Nixon, MD;  Location: [facility];  Service: General;  Laterality: N/A;   INCISION AND DRAINAGE ABSCESS ANAL  04/10/2011   Dr Gaylyn Keas   INCISION AND DRAINAGE ABSCESS ANAL  May 2013   Dr Lucienne Ryder   INCISION AND DRAINAGE ABSCESS ANAL  09/15/2012   Dr Rutherford Cowing   LAPAROSCOPIC APPENDECTOMY N/A 09/08/2015   Procedure: APPENDECTOMY LAPAROSCOPIC;  Surgeon: Lillette Reid III, MD;  Location: [facility];  Service: General;  Laterality: N/A;    Family Psychiatric History: Father Bipolar disoder  Family History:  Family History  Problem Relation Age of Onset   COPD Father    Heart disease Sister  congenital    Social History:  Social History   Socioeconomic History   Marital status: Married    Spouse name: Not on file   Number of children: 1   Years of education: Not on file   Highest education level: Not on file  Occupational History   Occupation: Forensic scientist: [employer]  Tobacco Use   Smoking status: Former    Current packs/day: 0.00    Types: Cigarettes    Quit date: 03/29/2011    Years since quitting: 12.8   Smokeless tobacco: Current    Types: Chew  Vaping Use   Vaping status: Never Used  Substance and Sexual  Activity   Alcohol use: No   Drug use: No   Sexual activity: Yes  Other Topics Concern   Not on file  Social History Narrative   Not on file   Social Drivers of Health   Financial Resource Strain: Low Risk  (11/17/2023)   [facility]   Overall Financial Resource Strain (CARDIA)    Difficulty of Paying Living Expenses: Not very hard  Food Insecurity: No Food Insecurity (11/17/2023)   [facility]   Hunger Vital Sign    Worried About Running Out of Food in the Last Year: Never true    Ran Out of Food in the Last Year: Never true  Transportation Needs: No Transportation Needs (11/17/2023)   Received from [facility] - Transportation    Lack of Transportation (Medical): No    Lack of Transportation (Non-Medical): No  Physical Activity: Sufficiently Active (11/17/2023)   Received from [facility]   Exercise Vital Sign    Days of Exercise per Week: 5 days    Minutes of Exercise per Session: 150+ min  Stress: No Stress Concern Present (11/17/2023)   Received from [facility] - Occupational Stress Questionnaire    Feeling of Stress : Not at all  Social Connections: Socially Integrated (11/17/2023)   Received from [facility]   Social Network    How would you rate your social network (family, work, friends)?: Good participation with social networks    Allergies:  Allergies  Allergen Reactions   Effexor [Venlafaxine Hydrochloride] Swelling and Other (See Comments)    Throat swelling, blurred vision   Morphine  Nausea And Vomiting   Prednisone  Other (See Comments)    "Hallucinations"     Metabolic Disorder Labs: Lab Results  Component Value Date   HGBA1C 5.7 (H) 12/16/2015   MPG 117 12/16/2015   MPG 117 (H) 07/18/2015   Lab Results  Component Value Date   PROLACTIN 7.5 07/25/2015   No results found for: "CHOL", "TRIG", "HDL", "CHOLHDL", "VLDL", "LDLCALC" Lab Results  Component  Value Date   TSH 1.050 07/18/2015   TSH 1.295 10/20/2013    Therapeutic Level Labs: No results found for: "LITHIUM" No results found for: "VALPROATE" No results found for: "CBMZ"  Current Medications: Current Outpatient Medications  Medication Sig Dispense Refill   acetaminophen  (TYLENOL ) 325 MG tablet Take 650 mg by mouth every 6 (six) hours as needed for moderate pain.     ARIPiprazole  (ABILIFY ) 10 MG tablet Take 1 tablet (10 mg total) by mouth at bedtime. 30 tablet 3   ibuprofen (ADVIL) 200 MG tablet Take 600 mg by mouth every 6 (six) hours as needed for moderate pain.     nicotine  (NICODERM CQ ) 21 mg/24hr patch Place 1 patch (21  mg total) onto the skin daily. 28 patch 3   OVER THE COUNTER MEDICATION Take 6 mg by mouth as needed (dipping sensation). Zyn nicotine  pouch     traMADol  (ULTRAM ) 50 MG tablet Take 1 tablet (50 mg total) by mouth every 6 (six) hours as needed (pain). 20 tablet 0   No current facility-administered medications for this visit.     Musculoskeletal: Strength & Muscle Tone: within normal limits Gait & Station: normal Patient leans: N/A  Psychiatric Specialty Exam: Review of Systems  There were no vitals taken for this visit.There is no height or weight on file to calculate BMI.  General Appearance: Well Groomed  Eye Contact:  Good  Speech:  Clear and Coherent and Normal Rate  Volume:  Normal  Mood:  Euthymic  Affect:  Appropriate and Congruent  Thought Process:  Coherent, Goal Directed and Linear  Orientation:  Full (Time, Place, and Person)  Thought Content: WDL and Logical   Suicidal Thoughts:  No  Homicidal Thoughts:  No  Memory:  Immediate;   Good Recent;   Good Remote;   Good  Judgement:  Good  Insight:  Good  Psychomotor Activity:  Normal  Concentration:  Concentration: Good and Attention Span: Good  Recall:  Good  Fund of Knowledge: Good  Language: Good  Akathisia:  No  Handed:  Right  AIMS (if indicated): Not done   Assets:   Communication Skills Desire for Improvement Financial Resources/Insurance Housing Intimacy Social Support  ADL's:  Intact  Cognition: WNL  Sleep:  Good   Screenings: GAD-7    Flowsheet Row Video Visit from 02/09/2024 in [facility] Video Visit from 12/10/2023 in [facility] Office Visit from 10/08/2023 in [facility] Video Visit from 06/17/2022 in [facility] Video Visit from 03/17/2022 in [facility]  Total GAD-7 Score 5 4 3 4 3       PHQ2-9    Flowsheet Row Video Visit from 02/09/2024 in [facility] Video Visit from 12/10/2023 in [facility] Office Visit from 10/08/2023 in [facility] Video Visit from 06/17/2022 in [facility] Video Visit from 03/17/2022 in [facility]  PHQ-2 Total Score 0 0 0 0 2  PHQ-9 Total Score 1 1 1  0 3      Flowsheet Row Admission (Discharged) from 08/18/2023 in [facility] ED from 11/11/2022 in [facility] Emergency Department at [facility] ED from 08/19/2022 in [facility] Emergency Department at [facility]  C-SSRS RISK CATEGORY No Risk No Risk No Risk        Assessment and Plan: Patient notes that he has stressors with his mom he also notes that he is able to cope.  He himself reports feeling mentally well.  No medication changes made today.  Patient agreeable to continue medication as prescribed. 1. Recurrent major depressive disorder, in partial remission (HCC)  Continue- ARIPiprazole  (ABILIFY ) 10 MG tablet; Take 1 tablet (10 mg total) by mouth at bedtime.  Dispense: 30 tablet; Refill: 3  2. Tobacco dependence due to chewing tobacco  Continue- nicotine  (NICODERM CQ ) 21 mg/24hr patch; Place 1 patch (21 mg total) onto the skin daily.  Dispense: 28 patch;  Refill: 3    Follow-up in 2.5 months   Arlyne Bering, NP 02/09/2024, 8:45 AM

## 2024-02-24 ENCOUNTER — Telehealth (HOSPITAL_COMMUNITY): Payer: Self-pay

## 2024-02-24 NOTE — Telephone Encounter (Signed)
 Pt called today stating that he wants to be see provider early, but noted that she may be going or preparing for maternity leave, but will send a message to see ho she would like to proceed.   JNL

## 2024-02-25 NOTE — Telephone Encounter (Signed)
 Provider has requested that administrative staff call patient and get him scheduled.

## 2024-03-27 ENCOUNTER — Ambulatory Visit (INDEPENDENT_AMBULATORY_CARE_PROVIDER_SITE_OTHER): Admitting: Physician Assistant

## 2024-03-27 DIAGNOSIS — F3341 Major depressive disorder, recurrent, in partial remission: Secondary | ICD-10-CM

## 2024-03-27 MED ORDER — ARIPIPRAZOLE 15 MG PO TABS
15.0000 mg | ORAL_TABLET | Freq: Every day | ORAL | 2 refills | Status: DC
Start: 2024-03-27 — End: 2024-05-08

## 2024-03-28 ENCOUNTER — Encounter (HOSPITAL_COMMUNITY): Payer: Self-pay | Admitting: Physician Assistant

## 2024-03-28 NOTE — Progress Notes (Signed)
 [facility] MD/PA/NP OP Progress Note  03/27/2024 3:00 PM [name].  MRN:  [id number]  Chief Complaint:  Chief Complaint  Patient presents with   Follow-up   Medication Management   HPI:   Bobbie Valletta. is a 43 year old male with a past psychiatric history significant for major depressive disorder (recurrent, partial remission) and generalized anxiety disorder who presents to [facility], accompanied by his Tyrian Peart, [phone]), for follow-up and medication management.  Patient was last seen by Dr. Harl on 02/09/2024.  During his last encounter, patient was being managed on the following psychiatric medication:  Abilify  10 mg daily Nicotine  (NicoDerm CQ ) 21 mg / 24-hour patch  Patient presents to the encounter with his spouse stating that he wanted to provide another perspective to his most recent behavior.  He reports that he has not been as honest with Dr. Harl as he should have regarding his mental health.  He reports that he still continues to take his Abilify  at night around the same time.  He reports that he has been experiencing difficulty losing weight since taking Abilify .  In regards to his mental health, patient states that if he is outside, he finds himself getting aggravated easily, a stark contrast to how he acts when involved in church or family related activities, which is calm and collected.  When at home, patient states that he is occasionally verbally abusive.  He denies being physically abusive but is fearful that he will become like his father if his behavior continues to worsen.  Patient endorses being easily irritable and states that if things do not go his way, then he will lash out.  Per patient's spouse, their son has struggled with anxiety and due to his anxiety, he has had to be home schooled.  Patient's spouse believes that their son being home schooled and other life stressors in the patient's life have  contributed to his behavior.  Patient attributes some of his attitude to work stating that he recently had stepped down from a position.  Patient reports that he occasionally feels down and depressed and feels that he is not good enough for his family.  He reports that he occasionally feels like crawling into a hole with a blanket and sleeping.  Patient endorses depression and rates his depression a 5-6 out of 10 with 10 being most severe.  Patient denies suicidal ideations.  Patient endorses depressive episodes 3-4 times a month/roughly 1-2 times per week.  Patient reports that he typically experiences his depressive episodes when things are not going right.  Patient endorses the following depressive symptoms: lack of motivation, feelings of guilt/worthlessness, and irritability.  Patient reports that he does not want to get to the point where his family won't talk to him due to his behavior.  Patient's current stressor revolves around his current relationship with his father.  He reports that his father also had issues with his behavior which made it tough on the patient growing up.  Patient reports that he forgives his father for what he has done in the past but he hasn't forgotten about his past actions.  In regards to his medication management, patient is interested in adjusting his doses of Abilify  stating that the medication has been relatively helpful in managing his mood.  Patient reports that he has been on several medications in the past such as Depakote , lithium, buspirone, Xanax, Zoloft, Lexapro, Prozac, Seroquel, and Celexa.  A PHQ-9 screen was performed with the  patient scoring a 7.  A GAD-7 screen was also performed with the patient scoring a 9.  Patient is alert and oriented x 4, calm, cooperative, and fully engaged in conversation during the encounter.  Patient endorses really good mood.  Patient exhibits euthymic mood with appropriate affect.  Patient denies suicidal or homicidal  ideations.  He further denies auditory or visual hallucinations and does not appear to be responding to internal/external stimuli.  Patient endorses good sleep and receives on average 7 to 9 hours of sleep per night.  Patient endorses good appetite and eats on average 4-5 meals per day.  Patient denies alcohol consumption or illicit drug use.  Patient endorses tobacco use stating that he continues to use nicotine  houses.  Visit Diagnosis:    ICD-10-CM   1. Recurrent major depressive disorder, in partial remission (HCC)  F33.41 ARIPiprazole  (ABILIFY ) 15 MG tablet      Past Psychiatric History:  Depression Anxiety  Past Medical History:  Past Medical History:  Diagnosis Date   Anxiety    (f/b by psych)   Hypertension    Migraine    Pre-diabetes    Smoker    former    Past Surgical History:  Procedure Laterality Date   ANAL FISTULOTOMY N/A 08/18/2023   Procedure: EVALUATION OF ANAL FISTULA;  Surgeon: Debby Hila, MD;  Location: [facility];  Service: General;  Laterality: N/A;   FISTULOTOMY N/A 08/18/2023   Procedure: FISTULOTOMY;  Surgeon: Debby Hila, MD;  Location: [facility];  Service: General;  Laterality: N/A;   INCISION AND DRAINAGE ABSCESS ANAL  04/10/2011   Dr Gladis   INCISION AND DRAINAGE ABSCESS ANAL  May 2013   Dr Vernetta   INCISION AND DRAINAGE ABSCESS ANAL  09/15/2012   Dr Elvin   LAPAROSCOPIC APPENDECTOMY N/A 09/08/2015   Procedure: APPENDECTOMY LAPAROSCOPIC;  Surgeon: Deward Null III, MD;  Location: [facility];  Service: General;  Laterality: N/A;    Family Psychiatric History:  Father - Bipolar disoder   Family History:  Family History  Problem Relation Age of Onset   COPD Father    Heart disease Sister        congenital    Social History:  Social History   Socioeconomic History   Marital status: Married    Spouse name: Not on file   Number of children: 1   Years of education: Not on file   Highest education  level: Not on file  Occupational History   Occupation: Forensic scientist: [employer]  Tobacco Use   Smoking status: Former    Current packs/day: 0.00    Types: Cigarettes    Quit date: 03/29/2011    Years since quitting: 13.0   Smokeless tobacco: Current    Types: Chew  Vaping Use   Vaping status: Never Used  Substance and Sexual Activity   Alcohol use: No   Drug use: No   Sexual activity: Yes  Other Topics Concern   Not on file  Social History Narrative   Not on file   Social Drivers of Health   Financial Resource Strain: Low Risk  (11/17/2023)   Received from [facility]   Overall Financial Resource Strain (CARDIA)    Difficulty of Paying Living Expenses: Not very hard  Food Insecurity: No Food Insecurity (11/17/2023)   Received from [facility]   Hunger Vital Sign    Within the past 12 months, you worried that your food would run  out before you got the money to buy more.: Never true    Within the past 12 months, the food you bought just didn't last and you didn't have money to get more.: Never true  Transportation Needs: No Transportation Needs (11/17/2023)   Received from [facility]   PRAPARE - Transportation    Lack of Transportation (Medical): No    Lack of Transportation (Non-Medical): No  Physical Activity: Sufficiently Active (11/17/2023)   Received from [facility]   Exercise Vital Sign    On average, how many days per week do you engage in moderate to strenuous exercise (like a brisk walk)?: 5 days    On average, how many minutes do you engage in exercise at this level?: 150+ min  Stress: No Stress Concern Present (11/17/2023)   Received from [facility] - Occupational Stress Questionnaire    Feeling of Stress : Not at all  Social Connections: Socially Integrated (11/17/2023)   Received from [facility]   Social Network    How would you rate your social network (family, work, friends)?: Good  participation with social networks    Allergies:  Allergies  Allergen Reactions   Effexor [Venlafaxine Hydrochloride] Swelling and Other (See Comments)    Throat swelling, blurred vision   Morphine  Nausea And Vomiting   Prednisone  Other (See Comments)    Hallucinations     Metabolic Disorder Labs: Lab Results  Component Value Date   HGBA1C 5.7 (H) 12/16/2015   MPG 117 12/16/2015   MPG 117 (H) 07/18/2015   Lab Results  Component Value Date   PROLACTIN 7.5 07/25/2015   No results found for: CHOL, TRIG, HDL, CHOLHDL, VLDL, LDLCALC Lab Results  Component Value Date   TSH 1.050 07/18/2015   TSH 1.295 10/20/2013    Therapeutic Level Labs: No results found for: LITHIUM No results found for: VALPROATE No results found for: CBMZ  Current Medications: Current Outpatient Medications  Medication Sig Dispense Refill   acetaminophen  (TYLENOL ) 325 MG tablet Take 650 mg by mouth every 6 (six) hours as needed for moderate pain.     ARIPiprazole  (ABILIFY ) 15 MG tablet Take 1 tablet (15 mg total) by mouth at bedtime. 30 tablet 2   ibuprofen (ADVIL) 200 MG tablet Take 600 mg by mouth every 6 (six) hours as needed for moderate pain.     nicotine  (NICODERM CQ ) 21 mg/24hr patch Place 1 patch (21 mg total) onto the skin daily. 28 patch 3   OVER THE COUNTER MEDICATION Take 6 mg by mouth as needed (dipping sensation). Zyn nicotine  pouch     traMADol  (ULTRAM ) 50 MG tablet Take 1 tablet (50 mg total) by mouth every 6 (six) hours as needed (pain). 20 tablet 0   No current facility-administered medications for this visit.     Musculoskeletal: Strength & Muscle Tone: within normal limits Gait & Station: normal Patient leans: N/A  Psychiatric Specialty Exam: Review of Systems  Psychiatric/Behavioral:  Positive for dysphoric mood. Negative for decreased concentration, hallucinations, self-injury, sleep disturbance and suicidal ideas. The patient is nervous/anxious. The  patient is not hyperactive.     Blood pressure (!) 157/101, pulse 79, temperature 98.2 F (36.8 C), temperature source Oral, height 6' (1.829 m), weight 257 lb 3.2 oz (116.7 kg), SpO2 96%.Body mass index is 34.88 kg/m.  General Appearance: Casual  Eye Contact:  Good  Speech:  Clear and Coherent and Normal Rate  Volume:  Normal  Mood:  Anxious  and Depressed  Affect:  Appropriate  Thought Process:  Coherent, Goal Directed, and Descriptions of Associations: Intact  Orientation:  Full (Time, Place, and Person)  Thought Content: WDL   Suicidal Thoughts:  No  Homicidal Thoughts:  No  Memory:  Immediate;   Good Recent;   Good Remote;   Good  Judgement:  Good  Insight:  Good  Psychomotor Activity:  Normal  Concentration:  Concentration: Good and Attention Span: Good  Recall:  Good  Fund of Knowledge: Good  Language: Good  Akathisia:  No  Handed:  Right  AIMS (if indicated): done; 1  Assets:  Communication Skills Desire for Improvement Financial Resources/Insurance Housing Intimacy Social Support Transportation Vocational/Educational  ADL's:  Intact  Cognition: WNL  Sleep:  Good   Screenings: AIMS    Flowsheet Row Clinical Support from 03/27/2024 in [facility]  AIMS Total Score 1   GAD-7    Flowsheet Row Clinical Support from 03/27/2024 in [facility] Video Visit from 02/09/2024 in [facility] Video Visit from 12/10/2023 in [facility] Office Visit from 10/08/2023 in [facility] Video Visit from 06/17/2022 in [facility]  Total GAD-7 Score 9 5 4 3 4    PHQ2-9    Flowsheet Row Clinical Support from 03/27/2024 in [facility] Video Visit from 02/09/2024 in [facility] Video Visit from 12/10/2023 in [facility] Office Visit from  10/08/2023 in [facility] Video Visit from 06/17/2022 in [facility]  PHQ-2 Total Score 2 0 0 0 0  PHQ-9 Total Score 7 1 1 1  0   Flowsheet Row Clinical Support from 03/27/2024 in [facility] Admission (Discharged) from 08/18/2023 in [facility] ED from 11/11/2022 in [facility] Emergency Department at [facility]  C-SSRS RISK CATEGORY Moderate Risk No Risk No Risk     Assessment and Plan:   Coral Soler. is a 43 year old male with a past psychiatric history significant for major depressive disorder (recurrent, partial remission) and generalized anxiety disorder who presents to [facility], accompanied by his Dontee Jaso, [phone]), for follow-up and medication management.  Patient was last seen by Dr. Harl on 02/09/2024.  Patient presents to the encounter stating that he brought his spouse with him to provide another perspective on how his behavior has been recently.  Patient reports that he has been struggling with his mental health and as a result, he has been taking it out on his family.  Patient denies being physically abusive towards his family but states that he finds himself being verbally abusive on occasion.  Patient attributes this shifts in his mood to life stressors related to family and work.  Despite his changes in mood, patient reports that he continues to take his Abilify  regularly.    An aims assessment was performed with the patient scoring a 1.  Patient reports that he experiences jerking movements in his right arm on occasion.  Provider also noted involuntary movements in his right arm on occasion during the assessment.  Despite experiencing jerky movements of his right arm, patient reports that the movements are minimal and denies experiencing any distress.  Patient denies medication management for his movements at this time.  Patient  reports that he experiences depression on occasion characterized by lack of motivation, feelings of worthlessness and guilt,  and irritability.  He reports feeling like wanting to crawl into a hole with the blanket and sleep.  Patient denies suicidal ideations.  Patient reports that his anxiety has not been an issue at this time.  A PHQ-9 screen was performed with the patient scoring a 7.  A GAD-7 screen was also performed with the patient scoring a 9.  Patient would like to continue taking Abilify  stating that he has been on several psychiatric medications in the past and Abilify  appears to be the only medication that has been helpful in keeping him stable.  Patient is interested in adjusting his dosage of Abilify  for the management of his mood.  Provider recommended increasing patient's Abilify  from 10 mg to 15 mg daily for mood stability.  Patient was agreeable to recommendation.  Patient's medication to be e-prescribed to pharmacy of choice.  Patient notes that he has not been utilizing his NicoDerm CQ  patches and states that he continues to use nicotine  pouches on occasion.  Patient informed provider that he would discuss with provider about using NicoDerm CQ  patches for the management of his tobacco use in the future.  A [place] Suicide Severity Rating Scale was performed with the patient being considered moderate risk.  Patient denies suicidal ideations and is able to contract for safety following the conclusion of the encounter.  Provider went over safety planning with the patient prior to the conclusion of the encounter.  Patient labs are still pending.  EKG pending.  Collaboration of Care: Collaboration of Care: Medication Management AEB provider managing patient's psychiatric medications and Psychiatrist AEB patient being followed by mental health provider at this facility  Patient/Guardian was advised Release of Information must be obtained prior to any record release in order to collaborate  their care with an outside provider. Patient/Guardian was advised if they have not already done so to contact the registration department to sign all necessary forms in order for us  to release information regarding their care.   Consent: Patient/Guardian gives verbal consent for treatment and assignment of benefits for services provided during this visit. Patient/Guardian expressed understanding and agreed to proceed.   1. Recurrent major depressive disorder, in partial remission (HCC)  - ARIPiprazole  (ABILIFY ) 15 MG tablet; Take 1 tablet (15 mg total) by mouth at bedtime.  Dispense: 30 tablet; Refill: 2  Patient to follow up in 6 weeks Provider spent a total of 30 minutes with the patient/reviewing patient's chart  Reginia FORBES Bolster, PA 03/27/2024, 3:00 PM

## 2024-04-14 ENCOUNTER — Telehealth (HOSPITAL_COMMUNITY): Admitting: Family

## 2024-04-18 DIAGNOSIS — F39 Unspecified mood [affective] disorder: Secondary | ICD-10-CM | POA: Diagnosis not present

## 2024-04-18 DIAGNOSIS — R0683 Snoring: Secondary | ICD-10-CM | POA: Diagnosis not present

## 2024-04-18 DIAGNOSIS — M2550 Pain in unspecified joint: Secondary | ICD-10-CM | POA: Diagnosis not present

## 2024-04-19 ENCOUNTER — Telehealth (HOSPITAL_COMMUNITY): Admitting: Psychiatry

## 2024-04-20 DIAGNOSIS — R7989 Other specified abnormal findings of blood chemistry: Secondary | ICD-10-CM | POA: Diagnosis not present

## 2024-04-20 DIAGNOSIS — K824 Cholesterolosis of gallbladder: Secondary | ICD-10-CM | POA: Diagnosis not present

## 2024-05-08 ENCOUNTER — Telehealth (HOSPITAL_COMMUNITY): Admitting: Physician Assistant

## 2024-05-08 ENCOUNTER — Encounter (HOSPITAL_COMMUNITY): Payer: Self-pay | Admitting: Physician Assistant

## 2024-05-08 DIAGNOSIS — F411 Generalized anxiety disorder: Secondary | ICD-10-CM

## 2024-05-08 DIAGNOSIS — Z79899 Other long term (current) drug therapy: Secondary | ICD-10-CM

## 2024-05-08 DIAGNOSIS — F3341 Major depressive disorder, recurrent, in partial remission: Secondary | ICD-10-CM

## 2024-05-08 MED ORDER — ARIPIPRAZOLE 15 MG PO TABS: 15.0000 mg | ORAL_TABLET | Freq: Every day | ORAL | 2 refills | Status: DC

## 2024-05-08 NOTE — Progress Notes (Unsigned)
 [facility] MD/PA/NP OP Progress Note  05/08/2024 4:00 PM Roberto Lewis.  MRN:  [id number]  Chief Complaint:  Chief Complaint  Patient presents with  . Follow-up  . Medication Refill   HPI:   Roberto Lewis. is a 43 year old male with a past psychiatric history significant for major depressive disorder (recurrent, partial remission) and generalized anxiety disorder who presents to [facility] for follow-up and medication management. *** : Abilify  15 mg daily  ***  Patient is alert and oriented x 4, calm, cooperative, and fully engaged in conversation during the encounter. ***  Visit Diagnosis:    ICD-10-CM   1. Recurrent major depressive disorder, in partial remission (HCC)  F33.41 ARIPiprazole  (ABILIFY ) 15 MG tablet      Past Psychiatric History:  Depression Anxiety  Past Medical History:  Past Medical History:  Diagnosis Date  . Anxiety    (f/b by psych)  . Hypertension   . Migraine   . Pre-diabetes   . Smoker    former    Past Surgical History:  Procedure Laterality Date  . ANAL FISTULOTOMY N/A 08/18/2023   Procedure: EVALUATION OF ANAL FISTULA;  Surgeon: Debby Hila, MD;  Location: [facility];  Service: General;  Laterality: N/A;  . FISTULOTOMY N/A 08/18/2023   Procedure: FISTULOTOMY;  Surgeon: Debby Hila, MD;  Location[facility];  Service: General;  Laterality: N/A;  . INCISION AND DRAINAGE ABSCESS ANAL  04/10/2011   Dr Gladis  . INCISION AND DRAINAGE ABSCESS ANAL  May 2013   Dr Vernetta  . INCISION AND DRAINAGE ABSCESS ANAL  09/15/2012   Dr Elvin  . LAPAROSCOPIC APPENDECTOMY N/A 09/08/2015   Procedure: APPENDECTOMY LAPAROSCOPIC;  Surgeon: Deward Null III, MD;  Location: [facility];  Service: General;  Laterality: N/A;    Family Psychiatric History:  Father - Bipolar disoder   Family History:  Family History  Problem Relation Age of Onset  . COPD Father   . Heart disease Sister         congenital    Social History:  Social History   Socioeconomic History  . Marital status: Married    Spouse name: Not on file  . Number of children: 1  . Years of education: Not on file  . Highest education level: Not on file  Occupational History  . Occupation: Forensic scientist: [employer]  Tobacco Use  . Smoking status: Former    Current packs/day: 0.00    Types: Cigarettes    Quit date: 03/29/2011    Years since quitting: 13.1  . Smokeless tobacco: Current    Types: Chew  Vaping Use  . Vaping status: Never Used  Substance and Sexual Activity  . Alcohol use: No  . Drug use: No  . Sexual activity: Yes  Other Topics Concern  . Not on file  Social History Narrative  . Not on file   Social Drivers of Health   Financial Resource Strain: Low Risk  (11/17/2023)   Received from [facility]   Overall Financial Resource Strain (CARDIA)   . Difficulty of Paying Living Expenses: Not very hard  Food Insecurity: No Food Insecurity (11/17/2023)   Received from [facility]   Hunger Vital Sign   . Within the past 12 months, you worried that your food would run out before you got the money to buy more.: Never true   . Within the past 12 months, the food you bought just  didn't last and you didn't have money to get more.: Never true  Transportation Needs: No Transportation Needs (11/17/2023)   Received from [facility] - Transportation   . Lack of Transportation (Medical): No   . Lack of Transportation (Non-Medical): No  Physical Activity: Sufficiently Active (11/17/2023)   Received from [facility]   Exercise Vital Sign   . On average, how many days per week do you engage in moderate to strenuous exercise (like a brisk walk)?: 5 days   . On average, how many minutes do you engage in exercise at this level?: 150+ min  Stress: No Stress Concern Present (11/17/2023)   Received from [facility]  Occupational Stress Questionnaire   . Feeling of Stress : Not at all  Social Connections: Socially Integrated (11/17/2023)   Received from [facility]   Social Network   . How would you rate your social network (family, work, friends)?: Good participation with social networks    Allergies:  Allergies  Allergen Reactions  . Effexor [Venlafaxine Hydrochloride] Swelling and Other (See Comments)    Throat swelling, blurred vision  . Morphine  Nausea And Vomiting  . Prednisone  Other (See Comments)    Hallucinations     Metabolic Disorder Labs: Lab Results  Component Value Date   HGBA1C 5.7 (H) 12/16/2015   MPG 117 12/16/2015   MPG 117 (H) 07/18/2015   Lab Results  Component Value Date   PROLACTIN 7.5 07/25/2015   No results found for: CHOL, TRIG, HDL, CHOLHDL, VLDL, LDLCALC Lab Results  Component Value Date   TSH 1.050 07/18/2015   TSH 1.295 10/20/2013    Therapeutic Level Labs: No results found for: LITHIUM No results found for: VALPROATE No results found for: CBMZ  Current Medications: Current Outpatient Medications  Medication Sig Dispense Refill  . acetaminophen  (TYLENOL ) 325 MG tablet Take 650 mg by mouth every 6 (six) hours as needed for moderate pain.    . ARIPiprazole  (ABILIFY ) 15 MG tablet Take 1 tablet (15 mg total) by mouth at bedtime. 30 tablet 2  . ibuprofen (ADVIL) 200 MG tablet Take 600 mg by mouth every 6 (six) hours as needed for moderate pain.    . nicotine  (NICODERM CQ ) 21 mg/24hr patch Place 1 patch (21 mg total) onto the skin daily. 28 patch 3  . OVER THE COUNTER MEDICATION Take 6 mg by mouth as needed (dipping sensation). Zyn nicotine  pouch    . traMADol  (ULTRAM ) 50 MG tablet Take 1 tablet (50 mg total) by mouth every 6 (six) hours as needed (pain). 20 tablet 0   No current facility-administered medications for this visit.     Musculoskeletal: Strength & Muscle Tone: within normal limits Gait & Station: normal Patient  leans: N/A  Psychiatric Specialty Exam: Review of Systems  Psychiatric/Behavioral:  Positive for dysphoric mood. Negative for decreased concentration, hallucinations, self-injury, sleep disturbance and suicidal ideas. The patient is nervous/anxious. The patient is not hyperactive.     There were no vitals taken for this visit.There is no height or weight on file to calculate BMI.  General Appearance: Casual  Eye Contact:  Good  Speech:  Clear and Coherent and Normal Rate  Volume:  Normal  Mood:  Anxious and Depressed  Affect:  Appropriate  Thought Process:  Coherent, Goal Directed, and Descriptions of Associations: Intact  Orientation:  Full (Time, Place, and Person)  Thought Content: WDL   Suicidal Thoughts:  No  Homicidal  Thoughts:  No  Memory:  Immediate;   Good Recent;   Good Remote;   Good  Judgement:  Good  Insight:  Good  Psychomotor Activity:  Normal  Concentration:  Concentration: Good and Attention Span: Good  Recall:  Good  Fund of Knowledge: Good  Language: Good  Akathisia:  No  Handed:  Right  AIMS (if indicated): done; 1  Assets:  Communication Skills Desire for Improvement Financial Resources/Insurance Housing Intimacy Social Support Transportation Vocational/Educational  ADL's:  Intact  Cognition: WNL  Sleep:  Good   Screenings: AIMS    Flowsheet Row Video Visit from 05/08/2024 in [facility] Clinical Support from 03/27/2024 in [facility]  AIMS Total Score 1 1   GAD-7    Flowsheet Row Video Visit from 05/08/2024 in [facility] Clinical Support from 03/27/2024 in [facility] Video Visit from 02/09/2024 in [facility] Video Visit from 12/10/2023 in [facility] Office Visit from 10/08/2023 in [facility]  Total GAD-7 Score 5 9 5 4 3    PHQ2-9    Flowsheet  Row Video Visit from 05/08/2024 in [facility] Clinical Support from 03/27/2024 in [facility] Video Visit from 02/09/2024 in [facility] Video Visit from 12/10/2023 in [facility] Office Visit from 10/08/2023 in [facility]  PHQ-2 Total Score 2 2 0 0 0  PHQ-9 Total Score 4 7 1 1 1    Flowsheet Row Video Visit from 05/08/2024 in [facility] Clinical Support from 03/27/2024 in [facility] Admission (Discharged) from 08/18/2023 in [facility]  C-SSRS RISK CATEGORY Moderate Risk Moderate Risk No Risk     Assessment and Plan:   Roberto Lewis. is a 43 year old male with a past psychiatric history significant for major depressive disorder (recurrent, partial remission) and generalized anxiety disorder who presents to [facility] for follow-up and medication management. ***  Collaboration of Care: Collaboration of Care: Medication Management AEB provider managing patient's psychiatric medications and Psychiatrist AEB patient being followed by mental health provider at this facility  Patient/Guardian was advised Release of Information must be obtained prior to any record release in order to collaborate their care with an outside provider. Patient/Guardian was advised if they have not already done so to contact the registration department to sign all necessary forms in order for us  to release information regarding their care.   Consent: Patient/Guardian gives verbal consent for treatment and assignment of benefits for services provided during this visit. Patient/Guardian expressed understanding and agreed to proceed.   1. Recurrent major depressive disorder, in partial remission (HCC)  - ARIPiprazole  (ABILIFY ) 15 MG tablet; Take 1 tablet (15 mg total) by mouth at bedtime.   Dispense: 30 tablet; Refill: 2   Patient to follow up in *** weeks Provider spent a total of *** minutes with the patient/reviewing patient's chart  Reginia FORBES Bolster, PA 05/08/2024, 4:00 PM

## 2024-05-11 ENCOUNTER — Telehealth (HOSPITAL_COMMUNITY): Admitting: Psychiatry

## 2024-06-02 ENCOUNTER — Emergency Department (HOSPITAL_BASED_OUTPATIENT_CLINIC_OR_DEPARTMENT_OTHER)

## 2024-06-02 ENCOUNTER — Encounter (HOSPITAL_BASED_OUTPATIENT_CLINIC_OR_DEPARTMENT_OTHER): Payer: Self-pay

## 2024-06-02 ENCOUNTER — Other Ambulatory Visit: Payer: Self-pay

## 2024-06-02 ENCOUNTER — Emergency Department (HOSPITAL_BASED_OUTPATIENT_CLINIC_OR_DEPARTMENT_OTHER)
Admission: EM | Admit: 2024-06-02 | Discharge: 2024-06-02 | Disposition: A | Discharge status: Home / Self Care | Attending: Emergency Medicine | Admitting: Emergency Medicine

## 2024-06-02 DIAGNOSIS — K209 Esophagitis, unspecified without bleeding: Secondary | ICD-10-CM | POA: Insufficient documentation

## 2024-06-02 DIAGNOSIS — R140 Abdominal distension (gaseous): Secondary | ICD-10-CM | POA: Diagnosis not present

## 2024-06-02 DIAGNOSIS — R0789 Other chest pain: Secondary | ICD-10-CM | POA: Diagnosis not present

## 2024-06-02 LAB — BASIC METABOLIC PANEL WITH GFR
Anion gap: 12 (ref 5–15)
BUN: 17 mg/dL (ref 6–20)
CO2: 25 mmol/L (ref 22–32)
Calcium: 9.7 mg/dL (ref 8.9–10.3)
Chloride: 102 mmol/L (ref 98–111)
Creatinine, Ser: 1.05 mg/dL (ref 0.61–1.24)
GFR, Estimated: >60 mL/min (ref >60)
Glucose, Bld: 119 mg/dL — ABNORMAL HIGH (ref 70–99)
Potassium: 4.3 mmol/L (ref 3.5–5.1)
Sodium: 139 mmol/L (ref 135–145)

## 2024-06-02 LAB — CBC WITH DIFFERENTIAL/PLATELET
Abs Immature Granulocytes: 0.04 K/uL (ref 0.00–0.07)
Basophils Absolute: 0.1 K/uL (ref 0.0–0.1)
Basophils Relative: 1 %
Eosinophils Absolute: 0.3 K/uL (ref 0.0–0.5)
Eosinophils Relative: 3 %
HCT: 45.3 % (ref 39.0–52.0)
Hemoglobin: 15.6 g/dL (ref 13.0–17.0)
Immature Granulocytes: 0 %
Lymphocytes Relative: 23 %
Lymphs Abs: 2.3 K/uL (ref 0.7–4.0)
MCH: 32.1 pg (ref 26.0–34.0)
MCHC: 34.4 g/dL (ref 30.0–36.0)
MCV: 93.2 fL (ref 80.0–100.0)
Monocytes Absolute: 0.8 K/uL (ref 0.1–1.0)
Monocytes Relative: 8 %
Neutro Abs: 6.4 K/uL (ref 1.7–7.7)
Neutrophils Relative %: 65 %
Platelets: 255 K/uL (ref 150–400)
RBC: 4.86 MIL/uL (ref 4.22–5.81)
RDW: 12.2 % (ref 11.5–15.5)
WBC: 10 K/uL (ref 4.0–10.5)
nRBC: 0 % (ref 0.0–0.2)

## 2024-06-02 MED ORDER — PANTOPRAZOLE SODIUM 20 MG PO TBEC: 20.0000 mg | DELAYED_RELEASE_TABLET | Freq: Two times a day (BID) | ORAL | 0 refills | Status: AC

## 2024-06-02 MED ORDER — SUCRALFATE 1 G PO TABS: 1.0000 g | ORAL_TABLET | Freq: Three times a day (TID) | ORAL | 0 refills | Status: AC

## 2024-06-02 MED ORDER — GLUCAGON HCL RDNA (DIAGNOSTIC) 1 MG IJ SOLR
1.0000 mg | Freq: Once | INTRAMUSCULAR | Status: AC
  Administered 2024-06-02: 1 mg via INTRAVENOUS
  Filled 2024-06-02: qty 1

## 2024-06-02 MED ORDER — DIAZEPAM 5 MG/ML IJ SOLN
2.5000 mg | Freq: Once | INTRAMUSCULAR | Status: AC
  Administered 2024-06-02: 2.5 mg via INTRAVENOUS
  Filled 2024-06-02: qty 2

## 2024-06-02 MED ORDER — ONDANSETRON HCL 4 MG/2ML IJ SOLN
4.0000 mg | Freq: Once | INTRAMUSCULAR | Status: AC
  Administered 2024-06-02: 4 mg via INTRAVENOUS
  Filled 2024-06-02: qty 2

## 2024-06-02 MED ORDER — LIDOCAINE VISCOUS HCL 2 % MT SOLN
15.0000 mL | Freq: Once | OROMUCOSAL | Status: AC
  Administered 2024-06-02: 15 mL via OROMUCOSAL
  Filled 2024-06-02: qty 15

## 2024-06-02 MED ORDER — ALUM & MAG HYDROXIDE-SIMETH 200-200-20 MG/5ML PO SUSP
30.0000 mL | Freq: Once | ORAL | Status: AC
  Administered 2024-06-02: 30 mL via ORAL
  Filled 2024-06-02: qty 30

## 2024-06-02 NOTE — Discharge Instructions (Addendum)
 Take medications as prescribed.  I do think you had a mild food impaction that resolved but now with some irritation residual from that.  Follow-up with gastroenterologist.  Please return if symptoms worsen as we discussed.  Make sure all the food that you eat today is very smooth consistency things that do not need to really be chewed.

## 2024-06-02 NOTE — ED Notes (Signed)
 ED Provider at bedside.

## 2024-06-02 NOTE — ED Provider Notes (Signed)
 [name] Provider Note   CSN: [id number] Arrival date & time: 06/02/24  0536     Patient presents with: globus   Roberto Lewis. is a 43 y.o. male.   Patient here with feeling like food stuck in the mid chest.  Ate chicken bacon and Brussels sprouts last night.  Felt fine but in the middle the night he started feeling discomfort in his upper chest/throat area.  He is not having any chest pain shortness of breath weakness numbness tingling.  Is having pain when he swallows.  He has never had food impaction before.  Does not have any reflux history.  Denies any weakness numbness tingling.  He has been able to drink water after getting Valium  glucagon  and meds in triage.  He has a history of anxiety hypertension.  He felt like when he was eating he did cough pretty hard and thought he may be aspirated/irritated something.  The history is provided by the patient.       Prior to Admission medications   Medication Sig Start Date End Date Taking? Authorizing Provider  pantoprazole  (PROTONIX ) 20 MG tablet Take 1 tablet (20 mg total) by mouth 2 (two) times daily for 14 days. 06/02/24 06/16/24 Yes Abrahan Fulmore, DO  sucralfate  (CARAFATE ) 1 g tablet Take 1 tablet (1 g total) by mouth 4 (four) times daily -  with meals and at bedtime for 14 days. 06/02/24 06/16/24 Yes Catrice Zuleta, DO  acetaminophen  (TYLENOL ) 325 MG tablet Take 650 mg by mouth every 6 (six) hours as needed for moderate pain.    [provider]  ARIPiprazole  (ABILIFY ) 15 MG tablet Take 1 tablet (15 mg total) by mouth at bedtime. 05/08/24   Nwoko, Uchenna E, PA  ibuprofen (ADVIL) 200 MG tablet Take 600 mg by mouth every 6 (six) hours as needed for moderate pain.    [provider]  nicotine  (NICODERM CQ ) 21 mg/24hr patch Place 1 patch (21 mg total) onto the skin daily. 02/09/24   Harl Zane BRAVO, NP  OVER THE COUNTER MEDICATION Take 6 mg by mouth as needed (dipping  sensation). Zyn nicotine  pouch    [provider]  traMADol  (ULTRAM ) 50 MG tablet Take 1 tablet (50 mg total) by mouth every 6 (six) hours as needed (pain). 08/18/23   Debby Hila, MD    Allergies: Effexor [venlafaxine hydrochloride], Morphine , and Prednisone     Review of Systems  Updated Vital Signs BP (!) 148/92   Pulse 87   Temp 98.4 F (36.9 C) (Oral)   Resp 17   Ht 6' 1 (1.854 m)   Wt 113.4 kg   SpO2 97%   BMI 32.98 kg/m   Physical Exam Vitals and nursing note reviewed.  Constitutional:      General: He is not in acute distress.    Appearance: He is well-developed. He is not ill-appearing.  HENT:     Head: Normocephalic and atraumatic.     Nose: Nose normal.     Mouth/Throat:     Mouth: Mucous membranes are moist.  Eyes:     Extraocular Movements: Extraocular movements intact.     Conjunctiva/sclera: Conjunctivae normal.     Pupils: Pupils are equal, round, and reactive to light.  Cardiovascular:     Rate and Rhythm: Normal rate and regular rhythm.     Pulses: Normal pulses.     Heart sounds: Normal heart sounds. No murmur heard. Pulmonary:     Effort: Pulmonary  effort is normal. No respiratory distress.     Breath sounds: Normal breath sounds.  Abdominal:     General: Abdomen is flat.     Palpations: Abdomen is soft.     Tenderness: There is no abdominal tenderness.  Musculoskeletal:        General: No swelling.     Cervical back: Normal range of motion and neck supple.  Skin:    General: Skin is warm and dry.     Capillary Refill: Capillary refill takes less than 2 seconds.  Neurological:     General: No focal deficit present.     Mental Status: He is alert.  Psychiatric:        Mood and Affect: Mood normal.     (all labs ordered are listed, but only abnormal results are displayed) Labs Reviewed  BASIC METABOLIC PANEL WITH GFR - Abnormal; Notable for the following components:      Result Value   Glucose, Bld 119 (*)    All other  components within normal limits  CBC WITH DIFFERENTIAL/PLATELET    EKG: None  Radiology: DG Chest Portable 1 View Result Date: 06/02/2024 CLINICAL DATA:  43 year old male with globus sensation after eating chicken. Sharp pain when swallowing. Subsequent coughing. EXAM: PORTABLE CHEST 1 VIEW COMPARISON:  Chest radiographs 08/19/2022 and earlier. FINDINGS: Portable AP view at 0743 hours. Normal lung volumes and mediastinal contours. Visualized tracheal air column is within normal limits. Allowing for portable technique the lungs are clear. No radiopaque foreign body identified. Paucity of bowel gas in the visible upper abdomen. No osseous abnormality identified. IMPRESSION: Negative portable chest. Electronically Signed   By: VEAR Hurst M.D.   On: 06/02/2024 07:59     Procedures   Medications Ordered in the ED  glucagon  (human recombinant) (GLUCAGEN ) injection 1 mg (1 mg Intravenous Given 06/02/24 0657)  diazepam  (VALIUM ) injection 2.5 mg (2.5 mg Intravenous Given 06/02/24 0653)  ondansetron  (ZOFRAN ) injection 4 mg (4 mg Intravenous Given 06/02/24 0651)  alum & mag hydroxide-simeth (MAALOX/MYLANTA) 200-200-20 MG/5ML suspension 30 mL (30 mLs Oral Given 06/02/24 0800)  lidocaine  (XYLOCAINE ) 2 % viscous mouth solution 15 mL (15 mLs Mouth/Throat Given 06/02/24 0801)                                    Medical Decision Making Amount and/or Complexity of Data Reviewed Radiology: ordered.  Risk OTC drugs. Prescription drug management.   Roberto Lewis. is here with food stuck feeling in his throat.  Ate chicken Brussels sprouts and bacon last night.  When he was eating he choked up a bit.  He seemed to do well but started to have some pain with swallowing throughout the night.  Denies any chest pain shortness of breath.  He only has discomfort when he swallows.  Feels like something may be stuck in the upper part of his chest lower throat area.  He felt that when he ate he coughed really hard and  might irritated something or aspirated something.  But he has no respiratory distress.  I do think differential likely food impaction versus irritation from a food impaction that may be resolved as he is able to tolerate p.o. versus less likely aspiration.  I have no concern for ACS or other acute cardiac pulmonary process at this time.  He is ready been given cocktail with glucagon  and Valium  and Zofran  and feels like things have  really improved.  He is been able to drink water while in the waiting room.  Overall we will add a chest x-ray give GI cocktail with Maalox and viscous lidocaine  and let him continue to drink here.  Lab work was unremarkable per my review and interpretation.  Chest x-ray showed no acute findings.  I reviewed interpreted labs and imaging.  Reviewed radiology report.  Symptoms have mainly resolved.  He has been able to tolerate water without any issues.  I recommended that we have him eat some crackers here to see if he can tolerate that but he felt better and wanted to leave.  Ultimately I said that he should keep a very bland diet, pured/liquid consistency type food.  Will prescribe him Protonix  and Carafate  and have him follow-up with GI.  He understands strict turn precautions.  If he develops food impaction symptoms or any other concerning symptoms he was told to return for evaluation and likely EGD.  This chart was dictated using voice recognition software.  Despite best efforts to proofread,  errors can occur which can change the documentation meaning.      Final diagnoses:  Esophagitis    ED Discharge Orders          Ordered    pantoprazole  (PROTONIX ) 20 MG tablet  2 times daily        06/02/24 0837    sucralfate  (CARAFATE ) 1 g tablet  3 times daily with meals & bedtime        06/02/24 0837               Ruthe Cornet, DO 06/02/24 [phone]

## 2024-06-02 NOTE — ED Provider Triage Note (Signed)
 Emergency Medicine Provider Triage Evaluation Note  Roberto Lewis. , a 43 y.o. male  was evaluated in triage.  Pt complains of sensation of something stuck in throat since 430 this morning when he woke up.  Painful swallowing but controlling secretions.  No difficulty breathing.  Reports episode of coughing while eating last night but did not have any symptoms at the time.  No history of similar..  Review of Systems  Positive: Globus sensation Negative: Chest pain, shortness of breath  Physical Exam  BP (!) 148/92   Pulse 87   Temp 98.4 F (36.9 C) (Oral)   Resp 17   Ht 6' 1 (1.854 m)   Wt 113.4 kg   SpO2 97%   BMI 32.98 kg/m  Gen:   Awake, no distress   Resp:  Normal effort  MSK:   Moves extremities without difficulty  Other:  Controlling secretions, uvula midline Lungs clear  Medical Decision Making  Medically screening exam initiated at 6:30 AM.  Appropriate orders placed.  Roberto Lewis. was informed that the remainder of the evaluation will be completed by another provider, this initial triage assessment does not replace that evaluation, and the importance of remaining in the ED until their evaluation is complete.     Carita Senior, MD 06/02/24 [phone]

## 2024-06-02 NOTE — ED Triage Notes (Addendum)
 Patient arrives POV c/o feeling like he has something stuck in his throat when he woke up this morning at 4:30. Patient states he ate chicken with brussels sprouts and bacon last night for dinner; states he can't remember if he choked on a piece of food but states he remembers coughing. Patient states when he swallows there is a sharp sensation in his throat. Patient denies [facility].

## 2024-06-20 ENCOUNTER — Telehealth (HOSPITAL_COMMUNITY): Admitting: Physician Assistant

## 2024-06-20 ENCOUNTER — Encounter (HOSPITAL_COMMUNITY): Payer: Self-pay | Admitting: Psychiatry

## 2024-06-20 ENCOUNTER — Telehealth (INDEPENDENT_AMBULATORY_CARE_PROVIDER_SITE_OTHER): Admitting: Psychiatry

## 2024-06-20 DIAGNOSIS — F17220 Nicotine dependence, chewing tobacco, uncomplicated: Secondary | ICD-10-CM

## 2024-06-20 DIAGNOSIS — F3341 Major depressive disorder, recurrent, in partial remission: Secondary | ICD-10-CM | POA: Diagnosis not present

## 2024-06-20 MED ORDER — NICOTINE 21 MG/24HR TD PT24: 21.0000 mg | MEDICATED_PATCH | Freq: Every day | TRANSDERMAL | 3 refills | Status: DC

## 2024-06-20 MED ORDER — ARIPIPRAZOLE 15 MG PO TABS: 15.0000 mg | ORAL_TABLET | Freq: Every day | ORAL | 3 refills | Status: DC

## 2024-06-20 NOTE — Progress Notes (Signed)
 BH MD/PA/NP OP Progress Note Virtual Visit via Video Note  I connected with Roberto Job Raddle. on 06/20/24 at  4:00 PM EDT by a video enabled telemedicine application and verified that I am speaking with the correct person using two identifiers.  Location: Patient: Roberto Lewis Provider: Clinic   I discussed the limitations of evaluation and management by telemedicine and the availability of in person appointments. The patient expressed understanding and agreed to proceed.  I provided 30 minutes of non-face-to-face time during this encounter.             06/20/2024 3:57 PM Roberto Job Raddle.  MRN:  [id number]  Chief Complaint: Things are okay  HPI: 43 year old male seen today for follow up psychiatric evaluation. He has a psychiatric history or anxiety and depression.   He is currently managed on Abilify  10 mg daily and Nicorette CQ patches.  He reports that this medication is effective to conditions.  Today he was well-groomed, pleasant, calm, cooperative, and engaged in conversation.  He informed Clinical research associate that he and his family are at [employer]. He reports that he is doing well mentally.  Today provider conducted a GAD-7 and patient scored a 6 Provider also conducted PHQ-9 he scored a 5. He endorses adequate sleep and appetite. Today he denies SI/HI/VAH, mania, or paranoia.  Patient reports that at times his Nicorett patch does not stick. He reports that him walking in [place] might be making a sweat.  At this time wants to continue this patch.  No medication changes made today.  Patient agreeable to continue medication as prescribed.  No other concerns noted at this time.   Visit Diagnosis:    ICD-10-CM   1. Recurrent major depressive disorder, in partial remission  F33.41 ARIPiprazole  (ABILIFY ) 15 MG tablet    2. Tobacco dependence due to chewing tobacco  F17.220 nicotine  (NICODERM CQ ) 21 mg/24hr patch          Past Psychiatric History: Depression and  anxiety.   Past Medical History:  Past Medical History:  Diagnosis Date   Anxiety    (f/b by psych)   Hypertension    Migraine    Pre-diabetes    Smoker    former    Past Surgical History:  Procedure Laterality Date   ANAL FISTULOTOMY N/A 08/18/2023   Procedure: EVALUATION OF ANAL FISTULA;  Surgeon: Debby Hila, MD;  Location: [facility];  Service: General;  Laterality: N/A;   FISTULOTOMY N/A 08/18/2023   Procedure: FISTULOTOMY;  Surgeon: Debby Hila, MD;  Location: [facility];  Service: General;  Laterality: N/A;   INCISION AND DRAINAGE ABSCESS ANAL  04/10/2011   Dr Gladis   INCISION AND DRAINAGE ABSCESS ANAL  May 2013   Dr Vernetta   INCISION AND DRAINAGE ABSCESS ANAL  09/15/2012   Dr Elvin   LAPAROSCOPIC APPENDECTOMY N/A 09/08/2015   Procedure: APPENDECTOMY LAPAROSCOPIC;  Surgeon: Deward Null III, MD;  Location: [facility];  Service: General;  Laterality: N/A;    Family Psychiatric History: Father Bipolar disoder  Family History:  Family History  Problem Relation Age of Onset   COPD Father    Heart disease Sister        congenital    Social History:  Social History   Socioeconomic History   Marital status: Married    Spouse name: Not on file   Number of children: 1   Years of education: Not on file   Highest education level: Not  on file  Occupational History   Occupation: Forensic scientist: [employer]  Tobacco Use   Smoking status: Former    Current packs/day: 0.00    Types: Cigarettes    Quit date: 03/29/2011    Years since quitting: 13.2   Smokeless tobacco: Current    Types: Chew  Vaping Use   Vaping status: Never Used  Substance and Sexual Activity   Alcohol use: No   Drug use: No   Sexual activity: Yes  Other Topics Concern   Not on file  Social History Narrative   Not on file   Social Drivers of Health   Financial Resource Strain: Low Risk  (11/17/2023)   Received from [facility]    Overall Financial Resource Strain (CARDIA)    Difficulty of Paying Living Expenses: Not very hard  Food Insecurity: No Food Insecurity (11/17/2023)   Received from [facility]   Hunger Vital Sign    Within the past 12 months, you worried that your food would run out before you got the money to buy more.: Never true    Within the past 12 months, the food you bought just didn't last and you didn't have money to get more.: Never true  Transportation Needs: No Transportation Needs (11/17/2023)   Received from [facility] - Transportation    Lack of Transportation (Medical): No    Lack of Transportation (Non-Medical): No  Physical Activity: Sufficiently Active (11/17/2023)   Received from [facility]   Exercise Vital Sign    On average, how many days per week do you engage in moderate to strenuous exercise (like a brisk walk)?: 5 days    On average, how many minutes do you engage in exercise at this level?: 150+ min  Stress: No Stress Concern Present (11/17/2023)   Received from [facility] - Occupational Stress Questionnaire    Feeling of Stress : Not at all  Social Connections: Socially Integrated (11/17/2023)   Received from [facility]   Social Network    How would you rate your social network (family, work, friends)?: Good participation with social networks    Allergies:  Allergies  Allergen Reactions   Effexor [Venlafaxine Hydrochloride] Swelling and Other (See Comments)    Throat swelling, blurred vision   Morphine  Nausea And Vomiting   Prednisone  Other (See Comments)    Hallucinations     Metabolic Disorder Labs: Lab Results  Component Value Date   HGBA1C 5.7 (H) 12/16/2015   MPG 117 12/16/2015   MPG 117 (H) 07/18/2015   Lab Results  Component Value Date   PROLACTIN 7.5 07/25/2015   No results found for: CHOL, TRIG, HDL, CHOLHDL, VLDL, LDLCALC Lab Results  Component Value Date   TSH 1.050  07/18/2015   TSH 1.295 10/20/2013    Therapeutic Level Labs: No results found for: LITHIUM No results found for: VALPROATE No results found for: CBMZ  Current Medications: Current Outpatient Medications  Medication Sig Dispense Refill   acetaminophen  (TYLENOL ) 325 MG tablet Take 650 mg by mouth every 6 (six) hours as needed for moderate pain.     ARIPiprazole  (ABILIFY ) 15 MG tablet Take 1 tablet (15 mg total) by mouth at bedtime. 30 tablet 3   ibuprofen (ADVIL) 200 MG tablet Take 600 mg by mouth every 6 (six) hours as needed for moderate pain.     nicotine  (NICODERM CQ ) 21 mg/24hr patch Place 1 patch (21 mg  total) onto the skin daily. 28 patch 3   OVER THE COUNTER MEDICATION Take 6 mg by mouth as needed (dipping sensation). Zyn nicotine  pouch     pantoprazole  (PROTONIX ) 20 MG tablet Take 1 tablet (20 mg total) by mouth 2 (two) times daily for 14 days. 28 tablet 0   sucralfate  (CARAFATE ) 1 g tablet Take 1 tablet (1 g total) by mouth 4 (four) times daily -  with meals and at bedtime for 14 days. 56 tablet 0   traMADol  (ULTRAM ) 50 MG tablet Take 1 tablet (50 mg total) by mouth every 6 (six) hours as needed (pain). 20 tablet 0   No current facility-administered medications for this visit.     Musculoskeletal: Strength & Muscle Tone: within normal limits and telehealth visit Gait & Station: normal, telehealth visit Patient leans: N/A  Psychiatric Specialty Exam: Review of Systems  There were no vitals taken for this visit.There is no height or weight on file to calculate BMI.  General Appearance: Well Groomed  Eye Contact:  Good  Speech:  Clear and Coherent and Normal Rate  Volume:  Normal  Mood:  Euthymic  Affect:  Appropriate and Congruent  Thought Process:  Coherent, Goal Directed and Linear  Orientation:  Full (Time, Place, and Person)  Thought Content: WDL and Logical   Suicidal Thoughts:  No  Homicidal Thoughts:  No  Memory:  Immediate;   Good Recent;    Good Remote;   Good  Judgement:  Good  Insight:  Good  Psychomotor Activity:  Normal  Concentration:  Concentration: Good and Attention Span: Good  Recall:  Good  Fund of Knowledge: Good  Language: Good  Akathisia:  No  Handed:  Right  AIMS (if indicated): Not done   Assets:  Communication Skills Desire for Improvement Financial Resources/Insurance Housing Intimacy Social Support  ADL's:  Intact  Cognition: WNL  Sleep:  Good   Screenings: AIMS    Flowsheet Row Video Visit from 05/08/2024 in [facility] Clinical Support from 03/27/2024 in [facility]  AIMS Total Score 1 1   GAD-7    Flowsheet Row Video Visit from 06/20/2024 in [facility] Video Visit from 05/08/2024 in [facility] Clinical Support from 03/27/2024 in [facility] Video Visit from 02/09/2024 in [facility] Video Visit from 12/10/2023 in [facility]  Total GAD-7 Score 6 5 9 5 4    PHQ2-9    Flowsheet Row Video Visit from 06/20/2024 in [facility] Video Visit from 05/08/2024 in [facility] Clinical Support from 03/27/2024 in [facility] Video Visit from 02/09/2024 in [facility] Video Visit from 12/10/2023 in [facility]  PHQ-2 Total Score 3 2 2  0 0  PHQ-9 Total Score 5 4 7 1 1    Flowsheet Row Video Visit from 06/20/2024 in [facility] ED from 06/02/2024 in [facility] Emergency Department at [facility] Video Visit from 05/08/2024 in [facility]  C-SSRS RISK CATEGORY Error: Q7 should not be populated when Q6 is No No Risk Moderate Risk     Assessment and Plan: Patient notes that at times his Nicorette patch does not stick  because he is sweating.  He does associated with him walking more [place].  At this time he wishes to continue  all medications.  No medication changes made today.  Patient agreeable to continue medication prescribed 1. Recurrent major depressive disorder, in partial remission (HCC)  Continue- ARIPiprazole  (ABILIFY ) 10 MG tablet; Take 1 tablet (10 mg total) by mouth at bedtime.  Dispense: 30 tablet; Refill: 3  2. Tobacco dependence due to chewing tobacco  Continue- nicotine  (NICODERM CQ ) 21 mg/24hr patch; Place 1 patch (21 mg total) onto the skin daily.  Dispense: 28 patch; Refill: 3    Follow-up in 2.5 months   Zane FORBES Bach, NP 06/20/2024, 3:57 PM

## 2024-08-31 ENCOUNTER — Encounter (HOSPITAL_COMMUNITY): Payer: Self-pay | Admitting: Psychiatry

## 2024-08-31 ENCOUNTER — Telehealth (INDEPENDENT_AMBULATORY_CARE_PROVIDER_SITE_OTHER): Admitting: Psychiatry

## 2024-08-31 DIAGNOSIS — F3341 Major depressive disorder, recurrent, in partial remission: Secondary | ICD-10-CM

## 2024-08-31 DIAGNOSIS — F17220 Nicotine dependence, chewing tobacco, uncomplicated: Secondary | ICD-10-CM | POA: Diagnosis not present

## 2024-08-31 MED ORDER — NICOTINE POLACRILEX 4 MG MT GUM: 4.0000 mg | CHEWING_GUM | OROMUCOSAL | 3 refills | Status: DC | PRN

## 2024-08-31 MED ORDER — ARIPIPRAZOLE 15 MG PO TABS: 15.0000 mg | ORAL_TABLET | Freq: Every day | ORAL | 3 refills | Status: DC

## 2024-08-31 MED ORDER — LAMOTRIGINE 25 MG PO TABS: 25.0000 mg | ORAL_TABLET | Freq: Every day | ORAL | 3 refills | Status: DC

## 2024-08-31 NOTE — Progress Notes (Signed)
 BH MD/PA/NP OP Progress Note Virtual Visit via Video Note  I connected with Roberto Lewis. on 08/31/2024 at  2:30 PM EST by a video enabled telemedicine application and verified that I am speaking with the correct person using two identifiers.  Location: Patient: Car Provider: Clinic   I discussed the limitations of evaluation and management by telemedicine and the availability of in person appointments. The patient expressed understanding and agreed to proceed.  I provided 30 minutes of non-face-to-face time during this encounter.             08/31/2024 3:03 PM Roberto Lewis.  MRN:  [id number]  Chief Complaint: Things have been up and down  HPI: 43 year old male seen today for follow up psychiatric evaluation. He has a psychiatric history or anxiety and depression.   He is currently managed on Abilify  15 mg daily.  He reports that this medication is somewhat effective to conditions.  Today he was well-groomed, pleasant, calm, cooperative, and engaged in conversation.  He informed clinical research associate that things have been up and down. He notes that he has been in a funk and depressed. Patient notes that he is only been bathing twice a week. He also notes that his appetite has increased. He also reports that his concentration is poor and lacks motivation to do things he once enjoyed. He reports that this is impacting his home and work life as he has been more irritable and angry. Patient notes that he wants to leave work early because he is down. He also notes that he that he is distracted, having racing thoughts, fluctuations in mood, and impulsive spending. He notes that he buys things for his children and hunting equipment.    Patient reports that the above worsen his anxiety and depression. Today provider conducted a GAD-7 and patient scored a 13, at his last visit he scored a 6.  Provider also conducted PHQ-9 he scored a 13, at his last visit he scored a 5. He endorses adequate  sleep. Today he denies SI/HI/VAH, mania, or paranoia.  Patient reports that he has been coping by chewing tobacco. He request something to help with his tobacco dependence. In the past he informed writer that the Nicorett patch does not stick. Patient agreeable to start Nicorrect 4 mg Gum. He is also agreeable to starting Lamictal 25 mg for two weeks, then increase to 50 mg for two week, and finally increase to 75 mg to help manage mood. He will continue Abilify  as prescribed.  Patient has found antidepressants ineffective.  He has tried Wellbutrin and Prozac without success.  He has also tried BuSpar without success.  He has a family history of bipolar disorder (father and brother).  Patient informed that his diagnosis may need to be updated in the future for bipolar 2 upon further assessments.  He endorsed understanding and agreed.  Potential side effects of medication and risks vs benefits of treatment vs non-treatment were explained and discussed. All questions were answered.    Visit Diagnosis:  No diagnosis found.       Past Psychiatric History: Depression and anxiety.   Past Medical History:  Past Medical History:  Diagnosis Date   Anxiety    (f/b by psych)   Hypertension    Migraine    Pre-diabetes    Smoker    former    Past Surgical History:  Procedure Laterality Date   ANAL FISTULOTOMY N/A 08/18/2023   Procedure: EVALUATION OF ANAL FISTULA;  Surgeon:  Debby Hila, MD;  Location[facility];  Service: General;  Laterality: N/A;   FISTULOTOMY N/A 08/18/2023   Procedure: FISTULOTOMY;  Surgeon: Debby Hila, MD;  Location: [facility];  Service: General;  Laterality: N/A;   INCISION AND DRAINAGE ABSCESS ANAL  04/10/2011   Dr Gladis   INCISION AND DRAINAGE ABSCESS ANAL  May 2013   Dr Vernetta   INCISION AND DRAINAGE ABSCESS ANAL  09/15/2012   Dr Elvin   LAPAROSCOPIC APPENDECTOMY N/A 09/08/2015   Procedure: APPENDECTOMY LAPAROSCOPIC;   Surgeon: Deward Null III, MD;  Location: [facility];  Service: General;  Laterality: N/A;    Family Psychiatric History: Father Bipolar disorder and brother bipolar disorder  Family History:  Family History  Problem Relation Age of Onset   COPD Father    Heart disease Sister        congenital    Social History:  Social History   Socioeconomic History   Marital status: Married    Spouse name: Not on file   Number of children: 1   Years of education: Not on file   Highest education level: Not on file  Occupational History   Occupation: Forensic Scientist: [employer]  Tobacco Use   Smoking status: Former    Current packs/day: 0.00    Average packs/day: 1.0 packs/day    Types: Cigarettes    Quit date: 03/29/2011    Years since quitting: 13.4   Smokeless tobacco: Current    Types: Chew  Vaping Use   Vaping status: Never Used  Substance and Sexual Activity   Alcohol use: No   Drug use: No   Sexual activity: Yes  Other Topics Concern   Not on file  Social History Narrative   Not on file   Social Drivers of Health   Tobacco Use: High Risk (06/20/2024)   Patient History    Smoking Tobacco Use: Former    Smokeless Tobacco Use: Current    Passive Exposure: Not on Actuary Strain: Low Risk (11/17/2023)   Received from [facility]   Overall Financial Resource Strain (CARDIA)    Difficulty of Paying Living Expenses: Not very hard  Food Insecurity: No Food Insecurity (11/17/2023)   Received [facility]   Epic    Within the past 12 months, you worried that your food would run out before you got the money to buy more.: Never true    Within the past 12 months, the food you bought just didn't last and you didn't have money to get more.: Never true  Transportation Needs: No Transportation Needs (11/17/2023)   Received from [facility] - Transportation    Lack of Transportation (Medical): No    Lack of Transportation (Non-Medical):  No  Physical Activity: Sufficiently Active (11/17/2023)   Received from [facility]   Exercise Vital Sign    On average, how many days per week do you engage in moderate to strenuous exercise (like a brisk walk)?: 5 days    On average, how many minutes do you engage in exercise at this level?: 150+ min  Stress: No Stress Concern Present (11/17/2023)   Received from [facility] - Occupational Stress Questionnaire    Feeling of Stress : Not at all  Social Connections: Socially Integrated (11/17/2023)   Received from [facility]   Social Network    How would you rate your social network (  family, work, friends)?: Good participation with social networks  Depression (PHQ2-9): High Risk (08/31/2024)   Depression (PHQ2-9)    PHQ-2 Score: 13  Alcohol Screen: Not on file  Housing: Low Risk (11/17/2023)   Received from [facility]    In the last 12 months, was there a time when you were not able to pay the mortgage or rent on time?: No    In the past 12 months, how many times have you moved where you were living?: 0    At any time in the past 12 months, were you homeless or living in a shelter (including now)?: No  Utilities: Not At Risk (11/17/2023)   Received from [facility] Utilities    Threatened with loss of utilities: No  Health Literacy: Not on file    Allergies:  Allergies  Allergen Reactions   Effexor [Venlafaxine Hydrochloride] Swelling and Other (See Comments)    Throat swelling, blurred vision   Morphine  Nausea And Vomiting   Prednisone  Other (See Comments)    Hallucinations     Metabolic Disorder Labs: Lab Results  Component Value Date   HGBA1C 5.7 (H) 12/16/2015   MPG 117 12/16/2015   MPG 117 (H) 07/18/2015   Lab Results  Component Value Date   PROLACTIN 7.5 07/25/2015   No results found for: CHOL, TRIG, HDL, CHOLHDL, VLDL, LDLCALC Lab Results  Component Value Date   TSH 1.050  07/18/2015   TSH 1.295 10/20/2013    Therapeutic Level Labs: No results found for: LITHIUM No results found for: VALPROATE No results found for: CBMZ  Current Medications: Current Outpatient Medications  Medication Sig Dispense Refill   acetaminophen  (TYLENOL ) 325 MG tablet Take 650 mg by mouth every 6 (six) hours as needed for moderate pain.     ARIPiprazole  (ABILIFY ) 15 MG tablet Take 1 tablet (15 mg total) by mouth at bedtime. 30 tablet 3   ibuprofen (ADVIL) 200 MG tablet Take 600 mg by mouth every 6 (six) hours as needed for moderate pain.     nicotine  (NICODERM CQ ) 21 mg/24hr patch Place 1 patch (21 mg total) onto the skin daily. 28 patch 3   OVER THE COUNTER MEDICATION Take 6 mg by mouth as needed (dipping sensation). Zyn nicotine  pouch     pantoprazole  (PROTONIX ) 20 MG tablet Take 1 tablet (20 mg total) by mouth 2 (two) times daily for 14 days. 28 tablet 0   sucralfate  (CARAFATE ) 1 g tablet Take 1 tablet (1 g total) by mouth 4 (four) times daily -  with meals and at bedtime for 14 days. 56 tablet 0   traMADol  (ULTRAM ) 50 MG tablet Take 1 tablet (50 mg total) by mouth every 6 (six) hours as needed (pain). 20 tablet 0   No current facility-administered medications for this visit.     Musculoskeletal: Strength & Muscle Tone: within normal limits and telehealth visit Gait & Station: normal, telehealth visit Patient leans: N/A  Psychiatric Specialty Exam: Review of Systems  There were no vitals taken for this visit.There is no height or weight on file to calculate BMI.  General Appearance: Well Groomed  Eye Contact:  Good  Speech:  Clear and Coherent and Normal Rate  Volume:  Normal  Mood:  Euthymic  Affect:  Appropriate and Congruent  Thought Process:  Coherent, Goal Directed and Linear  Orientation:  Full (Time, Place, and Person)  Thought Content: WDL and Logical   Suicidal Thoughts:  No  Homicidal  Thoughts:  No  Memory:  Immediate;   Good Recent;    Good Remote;   Good  Judgement:  Good  Insight:  Good  Psychomotor Activity:  Normal  Concentration:  Concentration: Good and Attention Span: Good  Recall:  Good  Fund of Knowledge: Good  Language: Good  Akathisia:  No  Handed:  Right  AIMS (if indicated): Not done   Assets:  Communication Skills Desire for Improvement Financial Resources/Insurance Housing Intimacy Social Support  ADL's:  Intact  Cognition: WNL  Sleep:  Good   Screenings: AIMS    Flowsheet Row Video Visit from 05/08/2024 in [facility] Clinical Support from 03/27/2024 in [facility]  AIMS Total Score 1 1   GAD-7    Flowsheet Row Video Visit from 08/31/2024 in [facility] Video Visit from 06/20/2024 in [facility] Video Visit from 05/08/2024 in [facility] Clinical Support from 03/27/2024 in [facility] Video Visit from 02/09/2024 in [facility]  Total GAD-7 Score 13 6 5 9 5    PHQ2-9    Flowsheet Row Video Visit from 08/31/2024 in [facility] Video Visit from 06/20/2024 in [facility] Video Visit from 05/08/2024 in [facility] Clinical Support from 03/27/2024 in [facility] Video Visit from 02/09/2024 in [facility]  PHQ-2 Total Score 5 3 2 2  0  PHQ-9 Total Score 13 5 4 7 1    Flowsheet Row Video Visit from 06/20/2024 in [facility] ED from 06/02/2024 in [facility] Emergency Department at [facility] Video Visit from 05/08/2024 in [facility]  C-SSRS RISK CATEGORY Error: Q7 should not be populated when Q6 is No No Risk Moderate Risk     Assessment and Plan: Patient endorses increased anxiety, depression, and mood  fluctuations. Patient reports that he has been coping by chewing tobacco. He request something to help with his tobacco dependence. In the past he informed writer that the Nicorett patch does not stick. Patient agreeable to start Nicorrect 4 mg Gum. He is also agreeable to starting Lamictal 25 mg for two weeks, then increase to 50 mg for two week, and finally increase to 75 mg to help manage mood. He will continue Abilify  as prescribed.  Patient has found antidepressants ineffective.  He has tried Wellbutrin and Prozac without success.  He has also tried BuSpar without success.  He has a family history of bipolar disorder (father and brother).  Patient informed that his diagnosis may need to be updated in the future for bipolar 2 upon further assessments.  He endorsed understanding and agreed.  1. Recurrent major depressive disorder, in partial remission  Start- lamoTRIgine (LAMICTAL) 25 MG tablet; Take 1-3 tablets (25-75 mg total) by mouth daily. Take one tablet (25 mg) for two weeks, then increase two tablet (50 mg) for two weeks, and then taper to (75 mg) for two weeks.  Dispense: 30 tablet; Refill: 3 Continue- ARIPiprazole  (ABILIFY ) 15 MG tablet; Take 1 tablet (15 mg total) by mouth at bedtime.  Dispense: 30 tablet; Refill: 3  2. Tobacco dependence due to chewing tobacco (Primary)  Continue- nicotine  polacrilex (NICORETTE) 4 MG gum; Take 1 each (4 mg total) by mouth as needed for smoking cessation.  Dispense: 100 tablet; Refill: 3     Follow-up in 1 months  Zane FORBES Bach, NP 08/31/2024, 3:03 PM

## 2024-09-18 ENCOUNTER — Telehealth (HOSPITAL_COMMUNITY): Payer: Self-pay

## 2024-09-18 NOTE — Telephone Encounter (Signed)
 Patient notes that Lamital was effective in managing his mood but reports that it caused him to feel dizzy, lightheaded, with headaches and stomachaches. With the medication he notes that his drive to do things improved but now notes that it has lowered again. Patient interested in trialing Auvelity to help manage mood. Provider agreeable to give patient a sample. If tolerated medication will be sent to preferred pharmacy. No other concerns noted at this time.

## 2024-09-18 NOTE — Telephone Encounter (Signed)
 Patient called in stating he has stopped taking the Lamotrigine . Per patient has experiencing side effects such as: Headache, Stomachache, unable to sleep and focus.

## 2024-10-12 ENCOUNTER — Telehealth (HOSPITAL_COMMUNITY): Admitting: Psychiatry

## 2024-10-12 ENCOUNTER — Encounter (HOSPITAL_COMMUNITY): Payer: Self-pay | Admitting: Psychiatry

## 2024-10-12 DIAGNOSIS — F3341 Major depressive disorder, recurrent, in partial remission: Secondary | ICD-10-CM | POA: Diagnosis not present

## 2024-10-12 MED ORDER — ARIPIPRAZOLE 15 MG PO TABS: 15.0000 mg | ORAL_TABLET | Freq: Every day | ORAL | 3 refills | Status: AC

## 2024-10-12 NOTE — Progress Notes (Signed)
 BH MD/PA/NP OP Progress Note Virtual Visit via Video Note  I connected with Roberto Lewis. on 10/12/24 at  2:00 PM EST by a video enabled telemedicine application and verified that I am speaking with the correct person using two identifiers.  Location: Patient: Roberto Lewis Provider: Clinic   I discussed the limitations of evaluation and management by telemedicine and the availability of in person appointments. The patient expressed understanding and agreed to proceed.  I provided 30 minutes of non-face-to-face time during this encounter.             10/12/2024 9:20 AM Roberto Lewis.  MRN:  [id number]  Chief Complaint: I have been taking natural supplements  HPI: 44 year old male seen today for follow up psychiatric evaluation. He has a psychiatric history or anxiety and depression.   He is currently managed on Abilify  15 mg daily, Lamictal  75 mg, Nicorette  4 mg gum, and Auvelity 45 mg. Lamictal  was discontinued because patient reported it caused dizziness. He also note that he has not started Auvelity.  He dislike the taste of his nicorette  gum and stopped it. He does note that he feels mentally stable.  Today he was well-groomed, pleasant, calm, cooperative, and engaged in conversation.  He informed clinical research associate that he not been taking Auvility. He instead notes that he has been taking natural pills called Energy and [place]. He notes that it is filled with vitamins. Patient reports that things are looking up. He notes that his mood is more stable, does not have racing thoughts, or impulsive behaviors. Patient also notes that he has joined a mens group at church and reports that he is enjoying the socialization and support.    Patient reports that his anxiety and depression are well managed. Today provider conducted a GAD-7 and patient scored a 4, at his last visit he scored a 13.  Provider also conducted PHQ-9 he scored a 2, at his last visit he scored a 13. He endorses adequate sleep.  Today he denies SI/HI/VAH, mania, or paranoia.  At this time Lamictal  and Auvelity not restarted. Patient will continue Abilify  as prescribed.  All questions were answered.    Visit Diagnosis:    ICD-10-CM   1. Recurrent major depressive disorder, in partial remission  F33.41 ARIPiprazole  (ABILIFY ) 15 MG tablet           Past Psychiatric History: Depression and anxiety.   Past Medical History:  Past Medical History:  Diagnosis Date   Anxiety    (f/b by psych)   Hypertension    Migraine    Pre-diabetes    Smoker    former    Past Surgical History:  Procedure Laterality Date   ANAL FISTULOTOMY N/A 08/18/2023   Procedure: EVALUATION OF ANAL FISTULA;  Surgeon: Debby Hila, MD;  Location: [facility];  Service: General;  Laterality: N/A;   FISTULOTOMY N/A 08/18/2023   Procedure: FISTULOTOMY;  Surgeon: Debby Hila, MD;  Location: [facility];  Service: General;  Laterality: N/A;   INCISION AND DRAINAGE ABSCESS ANAL  04/10/2011   Dr Gladis   INCISION AND DRAINAGE ABSCESS ANAL  May 2013   Dr Vernetta   INCISION AND DRAINAGE ABSCESS ANAL  09/15/2012   Dr Elvin   LAPAROSCOPIC APPENDECTOMY N/A 09/08/2015   Procedure: APPENDECTOMY LAPAROSCOPIC;  Surgeon: Deward Null III, MD;  Location: [facility];  Service: General;  Laterality: N/A;    Family Psychiatric History: Father Bipolar disorder and brother bipolar disorder  Family History:  Family History  Problem Relation Age of Onset   COPD Father    Heart disease Sister        congenital    Social History:  Social History   Socioeconomic History   Marital status: Married    Spouse name: Not on file   Number of children: 1   Years of education: Not on file   Highest education level: Not on file  Occupational History   Occupation: Forensic Scientist: [employer]  Tobacco Use   Smoking status: Former    Current packs/day: 0.00    Average packs/day: 1.0 packs/day     Types: Cigarettes    Quit date: 03/29/2011    Years since quitting: 13.5   Smokeless tobacco: Current    Types: Chew  Vaping Use   Vaping status: Never Used  Substance and Sexual Activity   Alcohol use: No   Drug use: No   Sexual activity: Yes  Other Topics Concern   Not on file  Social History Narrative   Not on file   Social Drivers of Health   Tobacco Use: High Risk (10/12/2024)   Patient History    Smoking Tobacco Use: Former    Smokeless Tobacco Use: Current    Passive Exposure: Not on Actuary Strain: Low Risk (11/17/2023)   Received from [facility]   Overall Financial Resource Strain (CARDIA)    Difficulty of Paying Living Expenses: Not very hard  Food Insecurity: No Food Insecurity (11/17/2023)   Received from [facility]   Epic    Within the past 12 months, you worried that your food would run out before you got the money to buy more.: Never true    Within the past 12 months, the food you bought just didn't last and you didn't have money to get more.: Never true  Transportation Needs: No Transportation Needs (11/17/2023)   Received from [facility] - Transportation    Lack of Transportation (Medical): No    Lack of Transportation (Non-Medical): No  Physical Activity: Sufficiently Active (11/17/2023)   Received from [facility]   Exercise Vital Sign    On average, how many days per week do you engage in moderate to strenuous exercise (like a brisk walk)?: 5 days    On average, how many minutes do you engage in exercise at this level?: 150+ min  Stress: No Stress Concern Present (11/17/2023)   Received from [facility] Occupational [facility] - Occupational Stress Questionnaire    Feeling of Stress : Not at all  Social Connections: Socially Integrated (11/17/2023)   Received from [facility]   Social Network    How would you rate your social network (family, work, friends)?: Good participation with social  networks  Depression (PHQ2-9): Low Risk (10/12/2024)   Depression (PHQ2-9)    PHQ-2 Score: 2  Recent Concern: Depression (PHQ2-9) - High Risk (08/31/2024)   Depression (PHQ2-9)    PHQ-2 Score: 13  Alcohol Screen: Not on file  Housing: Low Risk (11/17/2023)   Received from [facility]    In the last 12 months, was there a time when you were not able to pay the mortgage or rent on time?: No    In the past 12 months, how many times have you moved where you were living?: 0    At any time in the past 12 months, were you homeless or living in  a shelter (including now)?: No  Utilities: Not At Risk (11/17/2023)   Received from [facility] Utilities    Threatened with loss of utilities: No  Health Literacy: Not on file    Allergies:  Allergies  Allergen Reactions   Effexor [Venlafaxine Hydrochloride] Swelling and Other (See Comments)    Throat swelling, blurred vision   Morphine  Nausea And Vomiting   Prednisone  Other (See Comments)    Hallucinations     Metabolic Disorder Labs: Lab Results  Component Value Date   HGBA1C 5.7 (H) 12/16/2015   MPG 117 12/16/2015   MPG 117 (H) 07/18/2015   Lab Results  Component Value Date   PROLACTIN 7.5 07/25/2015   No results found for: CHOL, TRIG, HDL, CHOLHDL, VLDL, LDLCALC Lab Results  Component Value Date   TSH 1.050 07/18/2015   TSH 1.295 10/20/2013    Therapeutic Level Labs: No results found for: LITHIUM No results found for: VALPROATE No results found for: CBMZ  Current Medications: Current Outpatient Medications  Medication Sig Dispense Refill   acetaminophen  (TYLENOL ) 325 MG tablet Take 650 mg by mouth every 6 (six) hours as needed for moderate pain.     ARIPiprazole  (ABILIFY ) 15 MG tablet Take 1 tablet (15 mg total) by mouth at bedtime. 30 tablet 3   ibuprofen (ADVIL) 200 MG tablet Take 600 mg by mouth every 6 (six) hours as needed for moderate pain.     OVER THE COUNTER MEDICATION Take 6  mg by mouth as needed (dipping sensation). Zyn nicotine  pouch     pantoprazole  (PROTONIX ) 20 MG tablet Take 1 tablet (20 mg total) by mouth 2 (two) times daily for 14 days. 28 tablet 0   sucralfate  (CARAFATE ) 1 g tablet Take 1 tablet (1 g total) by mouth 4 (four) times daily -  with meals and at bedtime for 14 days. 56 tablet 0   traMADol  (ULTRAM ) 50 MG tablet Take 1 tablet (50 mg total) by mouth every 6 (six) hours as needed (pain). 20 tablet 0   No current facility-administered medications for this visit.     Musculoskeletal: Strength & Muscle Tone: within normal limits and telehealth visit Gait & Station: normal, telehealth visit Patient leans: N/A  Psychiatric Specialty Exam: Review of Systems  There were no vitals taken for this visit.There is no height or weight on file to calculate BMI.  General Appearance: Well Groomed  Eye Contact:  Good  Speech:  Clear and Coherent and Normal Rate  Volume:  Normal  Mood:  Euthymic  Affect:  Appropriate and Congruent  Thought Process:  Coherent, Goal Directed and Linear  Orientation:  Full (Time, Place, and Person)  Thought Content: WDL and Logical   Suicidal Thoughts:  No  Homicidal Thoughts:  No  Memory:  Immediate;   Good Recent;   Good Remote;   Good  Judgement:  Good  Insight:  Good  Psychomotor Activity:  Normal  Concentration:  Concentration: Good and Attention Span: Good  Recall:  Good  Fund of Knowledge: Good  Language: Good  Akathisia:  No  Handed:  Right  AIMS (if indicated): Not done   Assets:  Communication Skills Desire for Improvement Financial Resources/Insurance Housing Intimacy Social Support  ADL's:  Intact  Cognition: WNL  Sleep:  Good   Screenings: AIMS    Flowsheet Row Video Visit from 05/08/2024 in [facility] Clinical Support from 03/27/2024 in [facility]  AIMS Total Score 1 1  GAD-7    Flowsheet Row Video Visit from 10/12/2024 in  [facility] Video Visit from 08/31/2024 in [facility] Video Visit from 06/20/2024 in [facility] Video Visit from 05/08/2024 in [facility] Clinical Support from 03/27/2024 in [facility]  Total GAD-7 Score 4 13 6 5 9    PHQ2-9    Flowsheet Row Video Visit from 10/12/2024 in [facility] Video Visit from 08/31/2024 in [facility] Video Visit from 06/20/2024 in [facility] Video Visit from 05/08/2024 in [facility] Clinical Support from 03/27/2024 in [facility]  PHQ-2 Total Score 0 5 3 2 2   PHQ-9 Total Score 2 13 5 4 7    Flowsheet Row Video Visit from 06/20/2024 in [facility] ED from 06/02/2024 in [facility] Department [facility] Video Visit from 05/08/2024 in [facility]  C-SSRS RISK CATEGORY Error: Q7 should not be populated when Q6 is No No Risk Moderate Risk     Assessment and Plan: Patient notes that his anxiety, depression, and mood has improved. He notes that he has been taking natural supplements and not his nicorette  gym, Lamictal , or Auvelity. At this time Lamictal  and Auvelity not restarted. Patient will continue Abilify  as prescribed.   1. Recurrent major depressive disorder, in partial remission  Continue- ARIPiprazole  (ABILIFY ) 15 MG tablet; Take 1 tablet (15 mg total) by mouth at bedtime.  Dispense: 30 tablet; Refill: 3    Follow-up in 1 months   Zane FORBES Bach, NP 10/12/2024, 9:20 AM

## 2024-10-20 ENCOUNTER — Other Ambulatory Visit (HOSPITAL_COMMUNITY): Payer: Self-pay | Admitting: Psychiatry

## 2024-10-20 DIAGNOSIS — F3341 Major depressive disorder, recurrent, in partial remission: Secondary | ICD-10-CM

## 2024-12-13 ENCOUNTER — Telehealth (HOSPITAL_COMMUNITY): Admitting: Psychiatry
